# Patient Record
Sex: Female | Born: 1965
Health system: Southern US, Community
[De-identification: ages and names within clinical notes are randomized; demographics above are authoritative.]

## PROBLEM LIST (undated history)

## (undated) DIAGNOSIS — B009 Herpesviral infection, unspecified: Secondary | ICD-10-CM

## (undated) DIAGNOSIS — F4310 Post-traumatic stress disorder, unspecified: Secondary | ICD-10-CM

## (undated) DIAGNOSIS — K219 Gastro-esophageal reflux disease without esophagitis: Secondary | ICD-10-CM

## (undated) DIAGNOSIS — I10 Essential (primary) hypertension: Secondary | ICD-10-CM

## (undated) DIAGNOSIS — H269 Unspecified cataract: Secondary | ICD-10-CM

## (undated) DIAGNOSIS — H409 Unspecified glaucoma: Secondary | ICD-10-CM

## (undated) DIAGNOSIS — J45909 Unspecified asthma, uncomplicated: Secondary | ICD-10-CM

## (undated) DIAGNOSIS — F329 Major depressive disorder, single episode, unspecified: Secondary | ICD-10-CM

## (undated) DIAGNOSIS — E119 Type 2 diabetes mellitus without complications: Secondary | ICD-10-CM

## (undated) DIAGNOSIS — N809 Endometriosis, unspecified: Secondary | ICD-10-CM

## (undated) DIAGNOSIS — E7800 Pure hypercholesterolemia, unspecified: Secondary | ICD-10-CM

## (undated) DIAGNOSIS — F32A Depression, unspecified: Secondary | ICD-10-CM

## (undated) DIAGNOSIS — F411 Generalized anxiety disorder: Secondary | ICD-10-CM

## (undated) HISTORY — DX: Herpesviral infection, unspecified: B00.9

## (undated) HISTORY — DX: Generalized anxiety disorder: F41.1

## (undated) HISTORY — DX: Unspecified asthma, uncomplicated: J45.909

## (undated) HISTORY — DX: Post-traumatic stress disorder, unspecified: F43.10

## (undated) HISTORY — DX: Gastro-esophageal reflux disease without esophagitis: K21.9

## (undated) HISTORY — DX: Essential (primary) hypertension: I10

## (undated) HISTORY — DX: Pure hypercholesterolemia, unspecified: E78.00

## (undated) HISTORY — PX: ANTERIOR CRUCIATE LIGAMENT REPAIR: SHX115

## (undated) HISTORY — DX: Major depressive disorder, single episode, unspecified: F32.9

## (undated) HISTORY — DX: Endometriosis, unspecified: N80.9

## (undated) HISTORY — DX: Unspecified cataract: H26.9

## (undated) HISTORY — DX: Unspecified glaucoma: H40.9

## (undated) HISTORY — DX: Depression, unspecified: F32.A

---
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
## 1990-04-15 HISTORY — PX: NASAL SINUS SURGERY: SHX719

## 1998-06-14 DIAGNOSIS — B009 Herpesviral infection, unspecified: Secondary | ICD-10-CM

## 1998-06-14 HISTORY — DX: Herpesviral infection, unspecified: B00.9

## 1999-09-21 ENCOUNTER — Other Ambulatory Visit: Admission: RE | Admit: 1999-09-21 | Discharge: 1999-09-21 | Payer: Self-pay | Admitting: *Deleted

## 1999-11-06 ENCOUNTER — Ambulatory Visit (HOSPITAL_BASED_OUTPATIENT_CLINIC_OR_DEPARTMENT_OTHER): Admission: RE | Admit: 1999-11-06 | Discharge: 1999-11-06 | Payer: Self-pay | Admitting: Orthopedic Surgery

## 1999-12-26 ENCOUNTER — Other Ambulatory Visit: Admission: RE | Admit: 1999-12-26 | Discharge: 1999-12-26 | Payer: Self-pay | Admitting: *Deleted

## 2000-12-09 ENCOUNTER — Other Ambulatory Visit: Admission: RE | Admit: 2000-12-09 | Discharge: 2000-12-09 | Payer: Self-pay | Admitting: *Deleted

## 2002-01-06 ENCOUNTER — Other Ambulatory Visit: Admission: RE | Admit: 2002-01-06 | Discharge: 2002-01-06 | Payer: Self-pay | Admitting: *Deleted

## 2003-05-18 ENCOUNTER — Other Ambulatory Visit: Admission: RE | Admit: 2003-05-18 | Discharge: 2003-05-18 | Payer: Self-pay | Admitting: *Deleted

## 2004-05-28 ENCOUNTER — Other Ambulatory Visit: Admission: RE | Admit: 2004-05-28 | Discharge: 2004-05-28 | Payer: Self-pay | Admitting: *Deleted

## 2005-01-29 ENCOUNTER — Encounter: Admission: RE | Admit: 2005-01-29 | Discharge: 2005-01-29 | Payer: Self-pay | Admitting: Family Medicine

## 2005-01-29 ENCOUNTER — Encounter: Payer: Self-pay | Admitting: Family Medicine

## 2005-12-25 ENCOUNTER — Other Ambulatory Visit: Admission: RE | Admit: 2005-12-25 | Discharge: 2005-12-25 | Payer: Self-pay | Admitting: Obstetrics & Gynecology

## 2006-09-03 ENCOUNTER — Encounter: Admission: RE | Admit: 2006-09-03 | Discharge: 2006-09-03 | Payer: Self-pay | Admitting: Obstetrics & Gynecology

## 2006-11-28 ENCOUNTER — Encounter: Payer: Self-pay | Admitting: Family Medicine

## 2006-12-25 ENCOUNTER — Other Ambulatory Visit: Admission: RE | Admit: 2006-12-25 | Discharge: 2006-12-25 | Payer: Self-pay | Admitting: Obstetrics and Gynecology

## 2007-02-05 LAB — HM MAMMOGRAPHY: HM Mammogram: NORMAL

## 2007-12-24 ENCOUNTER — Encounter: Payer: Self-pay | Admitting: Family Medicine

## 2009-01-17 ENCOUNTER — Emergency Department (HOSPITAL_COMMUNITY): Admission: EM | Admit: 2009-01-17 | Discharge: 2009-01-17 | Payer: Self-pay | Admitting: Family Medicine

## 2009-01-19 ENCOUNTER — Emergency Department (HOSPITAL_COMMUNITY): Admission: EM | Admit: 2009-01-19 | Discharge: 2009-01-19 | Payer: Self-pay | Admitting: Emergency Medicine

## 2009-02-14 LAB — CONVERTED CEMR LAB: Pap Smear: NORMAL

## 2009-03-27 ENCOUNTER — Ambulatory Visit: Payer: Self-pay | Admitting: Family Medicine

## 2009-03-27 DIAGNOSIS — R5381 Other malaise: Secondary | ICD-10-CM | POA: Insufficient documentation

## 2009-03-27 DIAGNOSIS — F4321 Adjustment disorder with depressed mood: Secondary | ICD-10-CM | POA: Insufficient documentation

## 2009-03-27 DIAGNOSIS — R5383 Other fatigue: Secondary | ICD-10-CM

## 2009-03-27 DIAGNOSIS — K219 Gastro-esophageal reflux disease without esophagitis: Secondary | ICD-10-CM | POA: Insufficient documentation

## 2009-03-28 LAB — CONVERTED CEMR LAB
ALT: 31 units/L (ref 0–35)
AST: 21 units/L (ref 0–37)
Albumin: 4.1 g/dL (ref 3.5–5.2)
Alkaline Phosphatase: 61 units/L (ref 39–117)
BUN: 14 mg/dL (ref 6–23)
Basophils Absolute: 0 10*3/uL (ref 0.0–0.1)
Basophils Relative: 0.5 % (ref 0.0–3.0)
Bilirubin, Direct: 0 mg/dL (ref 0.0–0.3)
CO2: 28 meq/L (ref 19–32)
Calcium: 9.1 mg/dL (ref 8.4–10.5)
Chloride: 104 meq/L (ref 96–112)
Cholesterol: 204 mg/dL — ABNORMAL HIGH (ref 0–200)
Creatinine, Ser: 1 mg/dL (ref 0.4–1.2)
Direct LDL: 139.3 mg/dL
Eosinophils Absolute: 0.2 10*3/uL (ref 0.0–0.7)
Eosinophils Relative: 2.4 % (ref 0.0–5.0)
GFR calc non Af Amer: 64.62 mL/min (ref >60)
Glucose, Bld: 99 mg/dL (ref 70–99)
HCT: 41.1 % (ref 36.0–46.0)
HDL: 49 mg/dL (ref >39.00)
Hemoglobin: 13.9 g/dL (ref 12.0–15.0)
Lymphocytes Relative: 25.9 % (ref 12.0–46.0)
Lymphs Abs: 1.7 10*3/uL (ref 0.7–4.0)
MCHC: 33.9 g/dL (ref 30.0–36.0)
MCV: 92.6 fL (ref 78.0–100.0)
Monocytes Absolute: 0.6 10*3/uL (ref 0.1–1.0)
Monocytes Relative: 9.7 % (ref 3.0–12.0)
Neutro Abs: 4.2 10*3/uL (ref 1.4–7.7)
Neutrophils Relative %: 61.5 % (ref 43.0–77.0)
Platelets: 214 10*3/uL (ref 150.0–400.0)
Potassium: 4.1 meq/L (ref 3.5–5.1)
RBC: 4.44 M/uL (ref 3.87–5.11)
RDW: 12.2 % (ref 11.5–14.6)
Sodium: 139 meq/L (ref 135–145)
TSH: 1.36 microintl units/mL (ref 0.35–5.50)
Total Bilirubin: 0.9 mg/dL (ref 0.3–1.2)
Total CHOL/HDL Ratio: 4
Total Protein: 7.1 g/dL (ref 6.0–8.3)
Triglycerides: 80 mg/dL (ref 0.0–149.0)
VLDL: 16 mg/dL (ref 0.0–40.0)
WBC: 6.7 10*3/uL (ref 4.5–10.5)

## 2009-08-16 ENCOUNTER — Encounter: Admission: RE | Admit: 2009-08-16 | Discharge: 2009-08-16 | Payer: Self-pay | Admitting: Orthopedic Surgery

## 2009-09-13 HISTORY — PX: LAPAROSCOPIC TOTAL HYSTERECTOMY: SUR800

## 2009-09-26 ENCOUNTER — Ambulatory Visit (HOSPITAL_COMMUNITY): Admission: RE | Admit: 2009-09-26 | Discharge: 2009-09-26 | Payer: Self-pay | Admitting: Obstetrics & Gynecology

## 2009-10-11 ENCOUNTER — Ambulatory Visit (HOSPITAL_COMMUNITY): Admission: AD | Admit: 2009-10-11 | Discharge: 2009-10-11 | Payer: Self-pay | Admitting: Obstetrics & Gynecology

## 2009-12-04 ENCOUNTER — Encounter: Payer: Self-pay | Admitting: Cardiovascular Disease

## 2009-12-04 ENCOUNTER — Ambulatory Visit: Payer: Self-pay

## 2009-12-04 DIAGNOSIS — M7989 Other specified soft tissue disorders: Secondary | ICD-10-CM | POA: Insufficient documentation

## 2010-05-15 NOTE — Miscellaneous (Signed)
Summary: Orders Update  Clinical Lists Changes  Problems: Added new problem of SWELLING OF LIMB (ICD-729.81) Orders: Added new Test order of Venous Duplex Lower Extremity (Venous Duplex Lower) - Signed 

## 2010-05-15 NOTE — Letter (Signed)
Summary: [facility]  [facility]   Imported By: Lanelle Bal 04/24/2009 11:30:39  _____________________________________________________________________  External Attachment:    Type:   Image     Comment:   External Document

## 2010-06-06 ENCOUNTER — Other Ambulatory Visit: Payer: Self-pay | Admitting: Obstetrics & Gynecology

## 2010-06-06 DIAGNOSIS — Z1231 Encounter for screening mammogram for malignant neoplasm of breast: Secondary | ICD-10-CM

## 2010-06-15 ENCOUNTER — Ambulatory Visit
Admission: RE | Admit: 2010-06-15 | Discharge: 2010-06-15 | Disposition: A | Discharge status: Home / Self Care | Payer: 59 | Source: Ambulatory Visit | Attending: Obstetrics & Gynecology | Admitting: Obstetrics & Gynecology

## 2010-06-15 DIAGNOSIS — Z1231 Encounter for screening mammogram for malignant neoplasm of breast: Secondary | ICD-10-CM

## 2010-07-02 LAB — BASIC METABOLIC PANEL
BUN: 12 mg/dL (ref 6–23)
CO2: 26 mEq/L (ref 19–32)
Calcium: 9.2 mg/dL (ref 8.4–10.5)
Chloride: 103 mEq/L (ref 96–112)
Creatinine, Ser: 0.91 mg/dL (ref 0.4–1.2)
GFR calc Af Amer: >60 mL/min (ref >60)
GFR calc non Af Amer: >60 mL/min (ref >60)
Glucose, Bld: 95 mg/dL (ref 70–99)
Potassium: 3.9 mEq/L (ref 3.5–5.1)
Sodium: 138 mEq/L (ref 135–145)

## 2010-07-02 LAB — CBC
HCT: 39.5 % (ref 36.0–46.0)
HCT: 41.8 % (ref 36.0–46.0)
Hemoglobin: 13.6 g/dL (ref 12.0–15.0)
Hemoglobin: 14 g/dL (ref 12.0–15.0)
MCH: 30.9 pg (ref 26.0–34.0)
MCHC: 33.4 g/dL (ref 30.0–36.0)
MCHC: 34.3 g/dL (ref 30.0–36.0)
MCV: 90.1 fL (ref 78.0–100.0)
MCV: 90.4 fL (ref 78.0–100.0)
Platelets: 241 10*3/uL (ref 150–400)
Platelets: 329 10*3/uL (ref 150–400)
RBC: 4.39 MIL/uL (ref 3.87–5.11)
RBC: 4.63 MIL/uL (ref 3.87–5.11)
RDW: 12.4 % (ref 11.5–15.5)
RDW: 13.2 % (ref 11.5–15.5)
WBC: 8.3 10*3/uL (ref 4.0–10.5)
WBC: 9.2 10*3/uL (ref 4.0–10.5)

## 2010-07-02 LAB — URINE MICROSCOPIC-ADD ON

## 2010-07-02 LAB — SURGICAL PCR SCREEN
MRSA, PCR: NEGATIVE
Staphylococcus aureus: NEGATIVE

## 2010-07-02 LAB — URINALYSIS, ROUTINE W REFLEX MICROSCOPIC
Glucose, UA: NEGATIVE mg/dL
Hgb urine dipstick: NEGATIVE
Ketones, ur: NEGATIVE mg/dL
Nitrite: NEGATIVE
Protein, ur: NEGATIVE mg/dL
Specific Gravity, Urine: 1.026 (ref 1.005–1.030)
Urobilinogen, UA: 1 mg/dL (ref 0.0–1.0)
pH: 6 (ref 5.0–8.0)

## 2010-07-02 LAB — PREGNANCY, URINE: Preg Test, Ur: NEGATIVE

## 2010-08-31 NOTE — Op Note (Signed)
[place]. [facility]  Patient:    Brenda Obrien, Brenda Obrien                         MRN: [id number] Proc. Date: 11/06/99 Attending:  Alinda Deem, M.D.                           Operative Report  PREOPERATIVE DIAGNOSIS:  Cartilage tears to the left knee, possible loose bodies, lateral meniscal tear and deficient anterior cruciate ligament graft.  POSTOPERATIVE DIAGNOSIS:  Cartilage tears to the left knee, possible loose bodies, lateral meniscal tear and deficient anterior cruciate ligament graft. Chondromalacia of medial femoral condyle with grade 4 focal 1 x 1 cm trochlea, grade 4 focal 1 x 1 cm and the patella grade 3 focal 5 x 5 mm.  OPERATION PERFORMED:  Left knee arthroscopic partial lateral meniscectomy, debridement of partial anterior cruciate ligament tear, debridement of chondromalacia of the medial femoral condyle grade 4, trochlea and patella as well as arthroscopic removal of loose bodies.  SURGEON:  Alinda Deem, M.D.  ASSISTANT:  Siri Cole, PA student  ANESTHESIA:  General LMA.  ESTIMATED BLOOD LOSS:  Minimal.  FLUID REPLACEMENT:  1L of crystalloid.  DRAINS:  None.  TOURNIQUET TIME:  None.  INDICATIONS FOR PROCEDURE:   The patient is a 45 year old woman who underwent an over-the-top ACL reconstruction by another physician some time in the late 80s.  She has had increasing pain, popping and catching in her left knee over the last year to the point where it is now affecting her ability to do activities of daily living.  Plain radiographs show evidence of a degenerative tear of the medial meniscus.  She is tender over the medial joint line with positive McMurrays test.  She desires arthroscopic evaluation of the knee. The arthritic changes are probably stabilized, her knee which has an ACL graft by MRI scan.  It is somewhat attenuated.  She also has lateral meniscal tear by MRI scan.  DESCRIPTION OF PROCEDURE:  The patient was  identified by arm band and taken to the operating room at [facility] where the appropriate anesthetic monitors were attached and general LMA anesthesia was induced with the patient in a supine position.  A lateral post was applied to the table and left lower extremity prepped and draped in the usual sterile fashion from the ankle to the midthigh and we began the procedure by infiltrating the inferomedial and inferolateral peripatellar portal regions with 2 to 3 cc of 0.5% Marcaine with epinephrine solution and another 10 cc into the joint itself.  A #11 blade was used to make standard portals.  The arthroscope introduced through the inferolateral portal with a pump attached and an outflow through the inferomedial portal.  Grade 3 chondromalacia of the patella 5 x 5 mm was identified and debrided.  Grade 4 chondromalacia of the trochlea 1 x 1 cm was identified and debrided.  Moving to the medial compartment, a 1 x 1 cm area of grade 4 chondromalacia of the medial femoral condyle with flap tears was identified and debrided with a 3.5 mm gator sucker shaver.  The medial meniscus was intact.  Approximately 30 to 40% of the ACL over-the-top reconstruction fibers were torn and flipping in and out of the joint and these were also resected.  The rest of the graft was intact although somewhat loose and there were some  notch osteophytes as well.  Two loose bodies were identified and removed anteriorly, one osteochondral and one straight chondral.  On the lateral side a parrot beak tear of the lateral meniscus was identified and scalloped, removing the part that flipped in and out of the joint. The articular cartilage appeared to be in relatively good condition. The gutters were cleared.  The knee was then continuously washed out with normal saline solution.  The arthroscopic instruments were removed.  A dressing of Xeroform, 4 x 8 dressing sponges, Webril and an Ace wrap applied. The  patient was then awaken and taken to the recovery room without difficulty. DD:  11/06/99 TD:  11/07/99 Job: [id number]

## 2010-11-06 LAB — LIPID PANEL
Cholesterol: 214 mg/dL — AB (ref 0–200)
HDL: 47 mg/dL (ref 35–70)
LDL Cholesterol: 148 mg/dL
LDl/HDL Ratio: 4.6
Triglycerides: 94 mg/dL (ref 40–160)

## 2010-11-09 ENCOUNTER — Emergency Department (HOSPITAL_COMMUNITY)
Admission: EM | Admit: 2010-11-09 | Discharge: 2010-11-09 | Disposition: A | Discharge status: Home / Self Care | Payer: 59 | Attending: Emergency Medicine | Admitting: Emergency Medicine

## 2010-11-09 DIAGNOSIS — S40029A Contusion of unspecified upper arm, initial encounter: Secondary | ICD-10-CM | POA: Insufficient documentation

## 2010-11-09 DIAGNOSIS — R040 Epistaxis: Secondary | ICD-10-CM | POA: Insufficient documentation

## 2010-11-09 DIAGNOSIS — X58XXXA Exposure to other specified factors, initial encounter: Secondary | ICD-10-CM | POA: Insufficient documentation

## 2010-11-09 DIAGNOSIS — K219 Gastro-esophageal reflux disease without esophagitis: Secondary | ICD-10-CM | POA: Insufficient documentation

## 2010-11-09 DIAGNOSIS — F341 Dysthymic disorder: Secondary | ICD-10-CM | POA: Insufficient documentation

## 2010-11-13 ENCOUNTER — Encounter: Payer: Self-pay | Admitting: Family Medicine

## 2010-11-21 ENCOUNTER — Encounter: Payer: Self-pay | Admitting: Family Medicine

## 2010-11-22 ENCOUNTER — Encounter: Payer: Self-pay | Admitting: Family Medicine

## 2010-11-22 ENCOUNTER — Ambulatory Visit (INDEPENDENT_AMBULATORY_CARE_PROVIDER_SITE_OTHER): Payer: 59 | Admitting: Family Medicine

## 2010-11-22 VITALS — BP 126/76 | HR 88 | Temp 98.7°F | Wt 245.0 lb

## 2010-11-22 DIAGNOSIS — E785 Hyperlipidemia, unspecified: Secondary | ICD-10-CM

## 2010-11-22 DIAGNOSIS — R000 Tachycardia, unspecified: Secondary | ICD-10-CM

## 2010-11-22 NOTE — Patient Instructions (Addendum)
Return in 3 months for follow up with PCP, fasting prior for blood work to recheck cholesterol levels. Return sooner if cutting back on caffeine and healthy eating don't help rapid heart rate. 1 week prior to next appointment, keep log of blood pressures and heart rate. For leg swelling and blood pressure - watch salt intake, goal for you 2000mg  daily sodium. Increase potassium in diet - more fruits and vegetables. To help lower bad cholesterol - more soy, nuts, beans, legumes, lentils.  Low cholesterol diet provided. If bad cholesterol staying high, may start statin. Good to meet you, call us with questions.

## 2010-11-22 NOTE — Assessment & Plan Note (Addendum)
Reviewed last FLP in chart, discussed goal for her given fmhx likely <100, definitely <130. Pt prefers to avoid starting statin currently, prefers to do intensive diet/lifestly management. Provided with low chol diet, discussed healthy food choices. rec continued weight loss, congratulated on current weight loss. Advised f/u in 3 mo with PCP, return sooner for fasting bloodwork States had recent normal thyroid, kidneys, liver blood work at [facility].

## 2010-11-22 NOTE — Assessment & Plan Note (Addendum)
Anticipate significant component attributable to caffeine intake and recent increase. rec slowly decrease caffeine intake, push water during week. More fruits/vegetables for potassium. Today BP and HR stable.   RTC if red flags or not improving with this, o/w RTC 3 mo for f/u with PCP.  EKG - NSR 94, no ST /T changes, normal axis, intervals

## 2010-11-22 NOTE — Progress Notes (Signed)
Subjective:    Patient ID: Brenda Obrien, female    DOB: 10/27/1965, 45 y.o.   MRN: [id number]  HPI CC: heart rate and bp up?  April had knee surgery, 3 herniated disks.  Went through several rounds of prednisone and 6 cortisone shots.  After steroids, felt heart rate racing.  This has gone on since then.  Rapid regular heart rate.  Also endorses bp running elevated 150/100.  Heart rate 95-120.  Feels especially when laying down at night.  No skipped beats, just fast.  H/o heart murmur per pt.  Endorsing swelling in ankles and feet worse at end of day.  No chest pain or tightness, SOB, HA, dizziness, voiding ok.  Nose bleed 2 weeks ago.  Went to EMS and given afrin in nose, bleed stopped after 30 min (did go to [facility]).  Has had borderline blood pressure for some time now.  Trouble exercising 2/2 pain from back and knees.  Over last 2 wks has increased activity, watched diet, walking and recumbent bicycling, has lost 6 lbs.  Doesn't add salt to food. Not much water.  Not many vegetables.  Does well on fruits. Works 3rd shift.  2 Liter diet coke/day, started going up after surgery (correlating with when noted rapid heart rate). Quit smoking 2006.  Family history - significant CAD/MI in mother and father and their families.  PAD in mother.  + early family history - father with MI age 61, mother with MI late 33s.  Dr. Hyacinth Meeker [facility] checked cholesterol levels 04/2010 - returned high.  Given 6 mo for lifestyle changes, only mildly lowered.  Wants to stay away from meds.  Reviewed chart, chol levels from 10/2010 with TC 214, LDL 148.  Good HDL and trig. EKG done by OBGYN last year, normal.  Next appt with Dr. Dayton Martes - none.  Medications and allergies reviewed and updated in chart. Patient Active Problem List  Diagnoses  . GRIEF REACTION, ACUTE  . GERD  . SWELLING OF LIMB  . FATIGUE   Past Medical History  Diagnosis Date  . GERD (gastroesophageal reflux disease)    Past Surgical History    Procedure Date  . Anterior cruciate ligament repair 1987,2001,2003,2005   History  Substance Use Topics  . Smoking status: Former Smoker    Quit date: 04/15/2004  . Smokeless tobacco: Not on file  . Alcohol Use: Yes     Socially   Family History  Problem Relation Age of Onset  . Coronary artery disease Mother     PAD  . Heart attack Mother 35  . Lung cancer Father   . Heart attack Father 30  . Heart attack Brother 45  . Hepatitis Brother     Hep B  . Bipolar disorder Sister    No Known Allergies Current Outpatient Prescriptions on File Prior to Visit  Medication Sig Dispense Refill  . DULoxetine (CYMBALTA) 60 MG capsule Take 90 mg by mouth daily.       Marland Kitchen omeprazole (PRILOSEC) 40 MG capsule Take 40 mg by mouth daily.        . temazepam (RESTORIL) 30 MG capsule Take 30 mg by mouth at bedtime as needed.         Review of Systems Per HPI    Objective:   Physical Exam  Nursing note and vitals reviewed. Constitutional: She appears well-developed and well-nourished. No distress.  HENT:  Head: Normocephalic and atraumatic.  Mouth/Throat: Oropharynx is clear and moist. No oropharyngeal exudate.  Eyes: Conjunctivae and EOM are normal. Pupils are equal, round, and reactive to light. No scleral icterus.  Neck: Normal range of motion. Neck supple.  Cardiovascular: Normal rate, regular rhythm, normal heart sounds and intact distal pulses.   No murmur heard.      Upper normal heart rate, regular.  Pulmonary/Chest: Effort normal and breath sounds normal. No respiratory distress. She has no wheezes. She has no rales.  Abdominal: Soft. Bowel sounds are normal. She exhibits no distension. There is no tenderness. There is no rebound and no guarding.  Musculoskeletal: She exhibits no edema.       Stiff movements 2/2 pain  Lymphadenopathy:    She has no cervical adenopathy.  Skin: Skin is warm and dry. No rash noted.  Psychiatric: She has a normal mood and affect.           Assessment & Plan:

## 2011-02-18 ENCOUNTER — Other Ambulatory Visit (INDEPENDENT_AMBULATORY_CARE_PROVIDER_SITE_OTHER): Payer: 59

## 2011-02-18 DIAGNOSIS — E785 Hyperlipidemia, unspecified: Secondary | ICD-10-CM

## 2011-02-18 LAB — LIPID PANEL
Cholesterol: 225 mg/dL — ABNORMAL HIGH (ref 0–200)
HDL: 48.8 mg/dL (ref >39.00)
Total CHOL/HDL Ratio: 5
Triglycerides: 104 mg/dL (ref 0.0–149.0)
VLDL: 20.8 mg/dL (ref 0.0–40.0)

## 2011-02-18 LAB — LDL CHOLESTEROL, DIRECT: Direct LDL: 165.6 mg/dL

## 2011-02-25 ENCOUNTER — Ambulatory Visit (INDEPENDENT_AMBULATORY_CARE_PROVIDER_SITE_OTHER): Payer: 59 | Admitting: Family Medicine

## 2011-02-25 ENCOUNTER — Encounter: Payer: Self-pay | Admitting: Family Medicine

## 2011-02-25 VITALS — BP 124/89 | HR 101 | Temp 98.2°F | Ht 66.0 in | Wt 224.8 lb

## 2011-02-25 DIAGNOSIS — R000 Tachycardia, unspecified: Secondary | ICD-10-CM

## 2011-02-25 DIAGNOSIS — J029 Acute pharyngitis, unspecified: Secondary | ICD-10-CM

## 2011-02-25 DIAGNOSIS — E785 Hyperlipidemia, unspecified: Secondary | ICD-10-CM

## 2011-02-25 LAB — POCT RAPID STREP A (OFFICE): Rapid Strep A Screen: NEGATIVE

## 2011-02-25 MED ORDER — PENICILLIN V POTASSIUM 500 MG PO TABS: 500.0000 mg | ORAL_TABLET | Freq: Three times a day (TID) | ORAL | Status: AC

## 2011-02-25 MED ORDER — FLUCONAZOLE 150 MG PO TABS: 150.0000 mg | ORAL_TABLET | Freq: Once | ORAL | Status: AC

## 2011-02-25 MED ORDER — SIMVASTATIN 20 MG PO TABS: 20.0000 mg | ORAL_TABLET | Freq: Every day | ORAL | Status: DC

## 2011-02-25 NOTE — Patient Instructions (Addendum)
Please come back to have your labs rechecked in 6 months- cholesterol and liver panel. Call me with an update of your symptoms.   Have a wonderful Thanksgiving.

## 2011-02-25 NOTE — Progress Notes (Signed)
Subjective:    Patient ID: Brenda Obrien, female    DOB: March 29, 1966, 45 y.o.   MRN: [id number]  HPI  45 yo here for follow up.  HLD- chol levels from 10/2010 with TC 214, LDL 148.  Wanted to work on diet, in fact, lost significant amount of weight.  Wt Readings from Last 3 Encounters:  02/25/11 224 lb 12 oz (101.946 kg)  11/22/10 245 lb (111.131 kg)  03/27/09 209 lb 2.1 oz (94.861 kg)    Unfortunately, despite these changes,  LDL has increased further. Lab Results  Component Value Date   CHOL 225* 02/18/2011   HDL 48.80 02/18/2011   LDLCALC 148 11/06/2010   LDLDIRECT 165.6 02/18/2011   TRIG 104.0 02/18/2011   CHOLHDL 5 02/18/2011   Quit smoking 2006.  Very strong family cardiac history-  + PAD in mother.  + early family history - father with MI age 25, mother with MI late 58s.  Sore throat- woke up yesterday with very severe sore throat.  Has body aches, swollen nodes. No cough, runny nose, wheezing, SOB or other URI symptoms. Afebrile.  Medications and allergies reviewed and updated in chart. Patient Active Problem List  Diagnoses  . GRIEF REACTION, ACUTE  . GERD  . SWELLING OF LIMB  . FATIGUE  . Tachycardia  . Dyslipidemia   Past Medical History  Diagnosis Date  . GERD (gastroesophageal reflux disease)    Past Surgical History  Procedure Date  . Anterior cruciate ligament repair 1987,2001,2003,2005   History  Substance Use Topics  . Smoking status: Former Smoker    Quit date: 04/15/2004  . Smokeless tobacco: Not on file  . Alcohol Use: Yes     Socially   Family History  Problem Relation Age of Onset  . Coronary artery disease Mother     PAD  . Heart attack Mother 22  . Lung cancer Father   . Heart attack Father 30  . Heart attack Brother 45  . Hepatitis Brother     Hep B  . Bipolar disorder Sister    No Known Allergies Current Outpatient Prescriptions on File Prior to Visit  Medication Sig Dispense Refill  . cyclobenzaprine (FLEXERIL) 10 MG  tablet Take 10 mg by mouth 3 (three) times daily as needed.        . DULoxetine (CYMBALTA) 60 MG capsule Take 90 mg by mouth daily.       . ergocalciferol (VITAMIN D2) 50000 UNITS capsule Take 50,000 Units by mouth once a week.        Marland Kitchen omeprazole (PRILOSEC) 40 MG capsule Take 40 mg by mouth daily.        . temazepam (RESTORIL) 30 MG capsule Take 30 mg by mouth at bedtime as needed.        . traMADol (ULTRAM) 50 MG tablet Take 50 mg by mouth 2 (two) times daily.         Review of Systems Per HPI    Objective:   Physical Exam  BP 124/89  Pulse 101  Temp(Src) 98.2 F (36.8 C) (Oral)  Ht 5\' 6"  (1.676 m)  Wt 224 lb 12 oz (101.946 kg)  BMI 36.28 kg/m2  Constitutional: She appears well-developed and well-nourished. No distress.  HENT:  Head: Normocephalic and atraumatic.  Mouth/Throat: pharyngeal erythema and tonsillar enlargement with exudate.  Eyes: Conjunctivae and EOM are normal. Pupils are equal, round, and reactive to light. No scleral icterus.  Neck: Normal range of motion. Neck supple.  Cardiovascular:  Normal rate, regular rhythm, normal heart sounds and intact distal pulses.   No murmur heard.      Mildly tachycardic, regular.  Pulmonary/Chest: Effort normal and breath sounds normal. No respiratory distress. She has no wheezes. She has no rales.  Abdominal: Soft. Bowel sounds are normal. She exhibits no distension. There is no tenderness. There is no rebound and no guarding.  Musculoskeletal: She exhibits no edema.     Lymphadenopathy:    She has shotty cervical lymphadenopathy Skin: Skin is warm and dry. No rash noted.  Psychiatric: She has a normal mood and affect.     Assessment & Plan:   1. Dyslipidemia  Deteriorated. At this point, she is agreeable to starting medication. Will start Simvastatin 20 mg daily. Follow up labs in 6 months. Lipid panel, Hepatic function panel  2. Sore throat  New.  Rapid strep neg but history and physical exam consistent with strep  pharyngitis. Will treat with PCN 500 mg three times daily x 10 days. The patient indicates understanding of these issues and agrees with the plan.  POCT rapid strep A

## 2011-06-19 ENCOUNTER — Other Ambulatory Visit: Payer: Self-pay | Admitting: Family Medicine

## 2011-07-01 DIAGNOSIS — S83519A Sprain of anterior cruciate ligament of unspecified knee, initial encounter: Secondary | ICD-10-CM | POA: Insufficient documentation

## 2011-08-26 ENCOUNTER — Other Ambulatory Visit: Payer: 59

## 2011-08-30 ENCOUNTER — Other Ambulatory Visit (INDEPENDENT_AMBULATORY_CARE_PROVIDER_SITE_OTHER): Payer: 59

## 2011-08-30 DIAGNOSIS — E785 Hyperlipidemia, unspecified: Secondary | ICD-10-CM

## 2011-08-30 LAB — LIPID PANEL
Cholesterol: 175 mg/dL (ref 0–200)
LDL Cholesterol: 105 mg/dL — ABNORMAL HIGH (ref 0–99)
Total CHOL/HDL Ratio: 3
VLDL: 17 mg/dL (ref 0.0–40.0)

## 2011-08-30 LAB — HEPATIC FUNCTION PANEL
Alkaline Phosphatase: 76 U/L (ref 39–117)
Bilirubin, Direct: 0.1 mg/dL (ref 0.0–0.3)
Total Protein: 7.2 g/dL (ref 6.0–8.3)

## 2011-09-14 HISTORY — PX: OTHER SURGICAL HISTORY: SHX169

## 2011-10-20 ENCOUNTER — Other Ambulatory Visit: Payer: Self-pay | Admitting: Family Medicine

## 2012-01-20 ENCOUNTER — Encounter: Payer: Self-pay | Admitting: Family Medicine

## 2012-01-20 ENCOUNTER — Telehealth: Payer: Self-pay | Admitting: Family Medicine

## 2012-01-20 ENCOUNTER — Ambulatory Visit (INDEPENDENT_AMBULATORY_CARE_PROVIDER_SITE_OTHER): Payer: 59 | Admitting: Family Medicine

## 2012-01-20 VITALS — BP 132/92 | HR 84 | Temp 98.4°F | Wt 247.0 lb

## 2012-01-20 DIAGNOSIS — I10 Essential (primary) hypertension: Secondary | ICD-10-CM

## 2012-01-20 MED ORDER — DULOXETINE HCL 30 MG PO CPEP: 120.0000 mg | ORAL_CAPSULE | Freq: Every day | ORAL | Status: DC

## 2012-01-20 NOTE — Patient Instructions (Addendum)
Take 4 cymbalta a day and let us know if your pressure stays elevated at work.  Take care.

## 2012-01-20 NOTE — Telephone Encounter (Signed)
Caller: [name]/Patient; Patient Name: Brenda Obrien; PCP: Ruthe Mannan [facility]); Best Callback Phone Number: [phone]; Call regarding elvated BP and Nose bleeds, onset 10-4.  BP 190/100 on 10-4 with Nose bleed.  Patient denies Nose bleed, no way of checking BP currently.  BP 180/100 on 10-6.  Patient has slight Headache over right Eye. Pt has stressful job working for [employer]. All emergent symptoms ruled out per Hypertension protocol, see in 72 hours due to recurring nosebleeds and multiple elevated BP reading, no previous work-ups.  Appointment scheduled at 1400 on 10-7 with Dr Para March, no same day appointments with Dr Dayton Martes.  Patient verbalized understanding.

## 2012-01-20 NOTE — Telephone Encounter (Signed)
Left v/m for pt to call back; if pt not having chest pain, difficulty breathing,limbs working appropriately and no severe headache is OK to wait for 2 pm appt today per Dr Para March.

## 2012-01-20 NOTE — Telephone Encounter (Signed)
Pt called back no CP,SOB neuro problems and h/a is not severe. Pt will call back if symptoms change or worsen prior to appt today at 2 pm.

## 2012-01-20 NOTE — Telephone Encounter (Signed)
Okay to see patient today at [facility] as long as she doesn't have CP/SOB/focal neuro sx.  Thanks.

## 2012-01-20 NOTE — Progress Notes (Signed)
Works [employer], for 17 years.  Workload is increasing.  She changed to day shift but the call volume is increased during the day.  She is a Merchandiser, retail.  Her anxiety level is high, she admits this.  Working 12 hours shifts.  She enjoys the work.  Pressure has been up on recent checks.  Friday BP was up on check, had a nosebleed that day.  When she gets anxious, she'll get chest tightness but not sob.  No chest tightness o/w.  No CP with exercise at PT.  No BLE edema recently.  Weight increased in last 3 years with dec in exercise due to her knee pain.   Her BP is better after a slow shift at work.   Had been on cymbalta for family stress/mood changes and for pain in knee.  No SI/HI.  Meds, vitals, and allergies reviewed.   ROS: See HPI.  Otherwise, noncontributory.  nad ncat Mmm Nasal exam wnl Neck supple rrr ctab Ext well perfused.  L knee in brace

## 2012-01-21 DIAGNOSIS — I10 Essential (primary) hypertension: Secondary | ICD-10-CM | POA: Insufficient documentation

## 2012-01-21 NOTE — Assessment & Plan Note (Signed)
Likely exacerbated by work stress.  BP okay today.  D/w pt about options.  If BP were high today, I would have started BP meds.  With BP okay, I would inc the cymbalta to 120 mg (she'll continue the current rx for 90mg  and take an extra 30mg  from samples I gave her).  She agrees.  She'll check BP and if not improved will notify PCP or me.  She agrees.

## 2012-01-22 ENCOUNTER — Other Ambulatory Visit: Payer: Self-pay | Admitting: Family Medicine

## 2012-01-22 DIAGNOSIS — I10 Essential (primary) hypertension: Secondary | ICD-10-CM

## 2012-01-22 DIAGNOSIS — E785 Hyperlipidemia, unspecified: Secondary | ICD-10-CM

## 2012-01-27 ENCOUNTER — Other Ambulatory Visit: Payer: 59

## 2012-01-29 ENCOUNTER — Other Ambulatory Visit (INDEPENDENT_AMBULATORY_CARE_PROVIDER_SITE_OTHER): Payer: 59

## 2012-01-29 DIAGNOSIS — E785 Hyperlipidemia, unspecified: Secondary | ICD-10-CM

## 2012-01-29 DIAGNOSIS — I10 Essential (primary) hypertension: Secondary | ICD-10-CM

## 2012-01-29 LAB — COMPREHENSIVE METABOLIC PANEL
ALT: 47 U/L — ABNORMAL HIGH (ref 0–35)
AST: 30 U/L (ref 0–37)
Albumin: 3.8 g/dL (ref 3.5–5.2)
BUN: 12 mg/dL (ref 6–23)
CO2: 26 mEq/L (ref 19–32)
Calcium: 9.1 mg/dL (ref 8.4–10.5)
Chloride: 108 mEq/L (ref 96–112)
GFR: 72.94 mL/min (ref >60.00)
Potassium: 4.4 mEq/L (ref 3.5–5.1)

## 2012-01-29 LAB — LIPID PANEL
Cholesterol: 187 mg/dL (ref 0–200)
HDL: 53.4 mg/dL (ref >39.00)
Triglycerides: 151 mg/dL — ABNORMAL HIGH (ref 0.0–149.0)

## 2012-02-03 ENCOUNTER — Encounter: Payer: Self-pay | Admitting: Family Medicine

## 2012-02-03 ENCOUNTER — Ambulatory Visit (INDEPENDENT_AMBULATORY_CARE_PROVIDER_SITE_OTHER): Payer: 59 | Admitting: Family Medicine

## 2012-02-03 VITALS — BP 130/92 | HR 88 | Temp 98.0°F | Ht 66.0 in | Wt 244.0 lb

## 2012-02-03 DIAGNOSIS — R739 Hyperglycemia, unspecified: Secondary | ICD-10-CM

## 2012-02-03 DIAGNOSIS — Z0000 Encounter for general adult medical examination without abnormal findings: Secondary | ICD-10-CM

## 2012-02-03 DIAGNOSIS — E785 Hyperlipidemia, unspecified: Secondary | ICD-10-CM

## 2012-02-03 DIAGNOSIS — I10 Essential (primary) hypertension: Secondary | ICD-10-CM

## 2012-02-03 DIAGNOSIS — R7309 Other abnormal glucose: Secondary | ICD-10-CM

## 2012-02-03 MED ORDER — METFORMIN HCL ER (MOD) 500 MG PO TB24: 500.0000 mg | ORAL_TABLET | Freq: Every day | ORAL | Status: DC

## 2012-02-03 NOTE — Progress Notes (Signed)
Subjective:    Patient ID: Brenda Obrien, female    DOB: [birth year], 46 y.o.   MRN: [id number]  HPI  46 yo very pleasant female here for CPX. Has GYN- s/p hysterectomy.  GYN is Corrie Mckusick and she will see her 04/2012.  Hyperglycemia- fasting CBG has increased to 110 this week, as well as increase in TG. Mom was a diabetic.  Weight has gone up.  She does have end stage OA in her left knee but she knows she can still swim and do other activities.  Job is very stressful and admits to stress eating.  Wt Readings from Last 3 Encounters:  02/03/12 244 lb (110.678 kg)  01/20/12 247 lb (112.038 kg)  02/25/11 224 lb 12 oz (101.946 kg)   BP- has had elevated BP readings at work.  She does think it is stress related but she is worried about having a stroke.  When it goes up, typically 150s/90s but once was as high as 190 systolic.  She was symptomatic with that reading- face felt flushed.  No CP or SOB.  Recently increased dose of Cymbalta.  HLD- much improved on Zocor! Lab Results  Component Value Date   CHOL 187 01/29/2012   HDL 53.40 01/29/2012   LDLCALC 103* 01/29/2012   LDLDIRECT 165.6 02/18/2011   TRIG 151.0* 01/29/2012   CHOLHDL 4 01/29/2012      Quit smoking 2006.  Very strong family cardiac history-  + PAD in mother.  + early family history - father with MI age 40, mother with MI late 35s.    Medications and allergies reviewed and updated in chart. Patient Active Problem List  Diagnosis  . GRIEF REACTION, ACUTE  . GERD  . SWELLING OF LIMB  . FATIGUE  . Tachycardia  . Dyslipidemia  . Sore throat  . Hypertension  . Routine general medical examination at a health care facility   Past Medical History  Diagnosis Date  . GERD (gastroesophageal reflux disease)    Past Surgical History  Procedure Date  . Anterior cruciate ligament repair 1987,2001,2003,2005   History  Substance Use Topics  . Smoking status: Former Smoker    Quit date: 04/15/2004  . Smokeless  tobacco: Not on file  . Alcohol Use: Yes     Socially   Family History  Problem Relation Age of Onset  . Coronary artery disease Mother     PAD  . Heart attack Mother 57  . Lung cancer Father   . Heart attack Father 30  . Heart attack Brother 45  . Hepatitis Brother     Hep B  . Bipolar disorder Sister    No Known Allergies Current Outpatient Prescriptions on File Prior to Visit  Medication Sig Dispense Refill  . cyclobenzaprine (FLEXERIL) 10 MG tablet Take 10 mg by mouth 3 (three) times daily as needed.        . DULoxetine (CYMBALTA) 30 MG capsule Take 4 capsules (120 mg total) by mouth daily.      . ergocalciferol (VITAMIN D2) 50000 UNITS capsule Take 50,000 Units by mouth once a week.        Marland Kitchen omeprazole (PRILOSEC) 40 MG capsule Take 40 mg by mouth daily.        Marland Kitchen oxyCODONE-acetaminophen (PERCOCET/ROXICET) 5-325 MG per tablet Take 1 tablet by mouth every 4 (four) hours as needed.      . simvastatin (ZOCOR) 20 MG tablet TAKE 1 TABLET (20 MG TOTAL) BY MOUTH AT BEDTIME.  30  tablet  4  . temazepam (RESTORIL) 30 MG capsule Take 30 mg by mouth at bedtime as needed.        . traMADol (ULTRAM) 50 MG tablet Take 50 mg by mouth 2 (two) times daily as needed.        Review of Systems Per HPI    Objective:   Physical Exam  BP 130/92  Pulse 88  Temp 98 F (36.7 C)  Ht 5\' 6"  (1.676 m)  Wt 244 lb (110.678 kg)  BMI 39.38 kg/m2   General:  Well-developed,well-nourished,in no acute distress; alert,appropriate and cooperative throughout examination Head:  normocephalic and atraumatic.   Eyes:  vision grossly intact, pupils equal, pupils round, and pupils reactive to light.   Ears:  R ear normal and L ear normal.   Nose:  no external deformity.   Mouth:  good dentition.   Neck:  No deformities, masses, or tenderness noted. Lungs:  Normal respiratory effort, chest expands symmetrically. Lungs are clear to auscultation, no crackles or wheezes. Heart:  Normal rate and regular rhythm. S1  and S2 normal without gallop, murmur, click, rub or other extra sounds. Abdomen:  Bowel sounds positive,abdomen soft and non-tender without masses, organomegaly or hernias noted. Msk:  No deformity or scoliosis noted of thoracic or lumbar spine.   Extremities:  No clubbing, cyanosis, edema, or deformity noted with normal full range of motion of all joints.   Neurologic:  alert & oriented X3 and gait normal.   Skin:  Intact without suspicious lesions or rashes Cervical Nodes:  No lymphadenopathy noted Psych:  Cognition and judgment appear intact. Alert and cooperative with normal attention span and concentration. No apparent delusions, illusions, hallucinations  Assessment & Plan:   1. Routine general medical examination at a health care facility  Reviewed preventive care protocols, scheduled due services, and updated immunizations.  Discussed nutrition, exercise, diet, and healthy lifestyle.   2. Hypertension  Normotensive today.  Agree with increased dose of cymbalta as this is likely stress related.  She will buy a home BP cuff, call me with readings.  Since she is normotensive I am hesitant to start antihypertensives as I do not want to cause hypotension.  The patient indicates understanding of these issues and agrees with the plan.   3. Dyslipidemia  Improved with Zocor.  4. Hyperglycemia  New- discussed eat right diet.  Start Metformin 500 mg daily.  Discussed nutritionist referral- she would like to defer at this time.  Follow up in 3 months- check a1c at that time.  The patient indicates understanding of these issues and agrees with the plan.

## 2012-02-03 NOTE — Patient Instructions (Addendum)
Please keep checking your blood pressure at work and call me with readings.  If you have the money, OMRON home blood cuffs- digital are the best.  We are starting Metformin 500 mg daily.  Please come back in 3 months for follow up lab work and appointment.

## 2012-03-13 ENCOUNTER — Encounter: Payer: Self-pay | Admitting: Family Medicine

## 2012-03-13 ENCOUNTER — Ambulatory Visit (INDEPENDENT_AMBULATORY_CARE_PROVIDER_SITE_OTHER): Payer: 59 | Admitting: Family Medicine

## 2012-03-13 VITALS — BP 156/90 | HR 96 | Temp 98.8°F | Wt 240.0 lb

## 2012-03-13 DIAGNOSIS — F411 Generalized anxiety disorder: Secondary | ICD-10-CM

## 2012-03-13 DIAGNOSIS — F419 Anxiety disorder, unspecified: Secondary | ICD-10-CM | POA: Insufficient documentation

## 2012-03-13 DIAGNOSIS — I10 Essential (primary) hypertension: Secondary | ICD-10-CM

## 2012-03-13 MED ORDER — ESCITALOPRAM OXALATE 10 MG PO TABS: 10.0000 mg | ORAL_TABLET | Freq: Every day | ORAL | Status: DC

## 2012-03-13 MED ORDER — LORAZEPAM 0.5 MG PO TABS: 0.2500 mg | ORAL_TABLET | Freq: Three times a day (TID) | ORAL | Status: DC

## 2012-03-13 NOTE — Patient Instructions (Addendum)
It's great to see you. Hang in there. Let's wean off Cymbalta- 3 tablets by mouth daily x 1 week, 2 tablets by mouth daily x 1 week, 1 tab by mouth every other day for 1 week and stop.  Ativan as needed for anxiety.  Next week, start taking Lexapro 10 mg daily.  Call me in next 2 weeks.

## 2012-03-13 NOTE — Progress Notes (Signed)
Subjective:    Patient ID: Brenda Obrien, female    DOB: 1965-08-24, 46 y.o.   MRN: [id number]  HPI  46 yo very pleasant female here for:  BP- has had elevated BP readings at work.  Brings in log book- BP at home in 130s/80s-90s.  At work, running in 150s/90s .  She is sometimes symptomatic with these elevated readings- HA, facial flushing.   No CP or SOB.  Recently increased dose of Cymbalta which she feels is not helping. Has been on cymbalta since her sibling died years ago.  She does she a therapist she is very happy with.  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  Has too many responsibilities at work.  No SI or HI.    Patient Active Problem List  Diagnosis  . GRIEF REACTION, ACUTE  . GERD  . SWELLING OF LIMB  . FATIGUE  . Tachycardia  . Dyslipidemia  . Sore throat  . Hypertension  . Routine general medical examination at a health care facility  . Hyperglycemia   Past Medical History  Diagnosis Date  . GERD (gastroesophageal reflux disease)    Past Surgical History  Procedure Date  . Anterior cruciate ligament repair 1987,2001,2003,2005   History  Substance Use Topics  . Smoking status: Former Smoker    Quit date: 04/15/2004  . Smokeless tobacco: Not on file  . Alcohol Use: Yes     Comment: Socially   Family History  Problem Relation Age of Onset  . Coronary artery disease Mother     PAD  . Heart attack Mother 75  . Lung cancer Father   . Heart attack Father 30  . Heart attack Brother 45  . Hepatitis Brother     Hep B  . Bipolar disorder Sister    No Known Allergies Current Outpatient Prescriptions on File Prior to Visit  Medication Sig Dispense Refill  . cyclobenzaprine (FLEXERIL) 10 MG tablet Take 10 mg by mouth 3 (three) times daily as needed.        . DULoxetine (CYMBALTA) 30 MG capsule Take 4 capsules (120 mg total) by mouth daily.      . ergocalciferol (VITAMIN D2) 50000 UNITS capsule Take 50,000 Units by mouth once a  week.        . metFORMIN (GLUMETZA) 500 MG (MOD) 24 hr tablet Take 1 tablet (500 mg total) by mouth daily with breakfast.  30 tablet  2  . omeprazole (PRILOSEC) 40 MG capsule Take 40 mg by mouth daily.        Marland Kitchen oxyCODONE-acetaminophen (PERCOCET/ROXICET) 5-325 MG per tablet Take 1 tablet by mouth every 4 (four) hours as needed.      . simvastatin (ZOCOR) 20 MG tablet TAKE 1 TABLET (20 MG TOTAL) BY MOUTH AT BEDTIME.  30 tablet  4  . temazepam (RESTORIL) 30 MG capsule Take 30 mg by mouth at bedtime as needed.        . traMADol (ULTRAM) 50 MG tablet Take 50 mg by mouth 2 (two) times daily as needed.        Review of Systems Per HPI    Objective:   Physical Exam  BP 156/90  Pulse 96  Temp 98.8 F (37.1 C) (Oral)  Wt 240 lb (108.863 kg)  SpO2 98%   General:  Well-developed,well-nourished,in no acute distress; alert,appropriate and cooperative throughout examination Psych:  Cognition and judgment appear intact. Alert and cooperative with normal attention span and concentration. No apparent  delusions, illusions, hallucinations Tearful  Assessment & Plan:   1. Hypertension  Deteriorated like due to anxiety.   Has multiple normotensive readings so I am very hesitant to place her on antihypertensives.  2. Anxiety  Deteriorated.  See above.  Wean off cymbalta- very high dose currently.  Add prn ativan- discussed sedation and addiction precautions.  Start Lexapro 10 mg daily after dose of cymbalta has been weaned down to avoid serotonin syndrome. The patient indicates understanding of these issues and agrees with the plan.

## 2012-03-30 ENCOUNTER — Other Ambulatory Visit: Payer: Self-pay | Admitting: Family Medicine

## 2012-04-01 ENCOUNTER — Telehealth: Payer: Self-pay

## 2012-04-01 NOTE — Telephone Encounter (Signed)
Pt seen 03/13/12 off Cymbalta 1 week and pt taking Lexapro 1 1/2 weeks. Pt states she feels some difference but now she has period of uncertainty; pt states she feels weird; pt feels jittery inside.  3 hours After takes Lexapro she feels calm for 4-5 hours and then calmness goes awayPlease advise.[facility].Pt said only taking Ativan while she is at work and that seems to help. Today pt did not take Ativan at work and BP was 163/101; pt had h/a but no SOB or chest pain. Pt left work, took Ativan and now that she is at  Home she feels better. Pt has not taken BP again. Advised pt if condition changes or worsens before Dr Elmer Sow asst calls her back to go to [facility]

## 2012-04-01 NOTE — Telephone Encounter (Signed)
Let's go ahead and increase Lexapro to 20 mg daily to see if this will help= take two of her 10 mg tablets.  Please update dose in Epic.  Call us in a few days with an update.  I do want her to recheck her BP again tomorrow and call us.

## 2012-04-01 NOTE — Telephone Encounter (Signed)
Advised patient as instructed. 

## 2012-04-02 ENCOUNTER — Telehealth: Payer: Self-pay | Admitting: Family Medicine

## 2012-04-02 NOTE — Telephone Encounter (Signed)
Caller: Shadell/Patient; Phone: [phone]; Reason for Call: Calling to give her b/p reading for today: 174/89 @ 2p with a repeat at 230p 149/86. She will continue with her current medications as directed on 12/18 until she hears from someone in the office.

## 2012-04-03 ENCOUNTER — Telehealth: Payer: Self-pay | Admitting: Family Medicine

## 2012-04-03 NOTE — Telephone Encounter (Signed)
Please call to check on pt. 

## 2012-04-03 NOTE — Telephone Encounter (Signed)
Patient Information:  Caller Name: Kristie  Phone: [phone]  Patient: Brenda Obrien, Brenda Obrien  Gender: Female  DOB: 05-08-1965  Age: 46 Years  PCP: Ruthe Mannan [facility])  Pregnant: No  Office Follow Up:  Does the office need to follow up with this patient?: No  Instructions For The Office: N/A  RN Note:  Has intermittent readings over past several months, so has not been placed on blood pressure medicatiions, as Dr. Dayton Martes tells her she's afraid of bottoming her out.  Patient states she feels well.  Denies anxiety or headache.  Per protocol, emergent symptoms denied; advised follow up within 2 weeks.  Will continue to monitor over weekend and call Monday 04/06/12 as discussed with office staff.  Info to office.  krs/can  Symptoms  Reason For Call & Symptoms: BP 158/98 1430 04/03/12.  Reviewed Health History In EMR: Yes  Reviewed Medications In EMR: Yes  Reviewed Allergies In EMR: Yes  Reviewed Surgeries / Procedures: Yes  Date of Onset of Symptoms: Unknown OB / GYN:  LMP: Unknown  Guideline(s) Used:  High Blood Pressure  Disposition Per Guideline:   See Within 2 Weeks in Office  Reason For Disposition Reached:   BP > 140/90 and is not taking BP medications  Advice Given:  N/A  Patient Refused Recommendation:  Patient Will Follow Up With Office Later  Patient will follow up with office as discussed with staff krs/can

## 2012-04-03 NOTE — Telephone Encounter (Signed)
Noted! Thank you

## 2012-04-03 NOTE — Telephone Encounter (Signed)
Spoke with patient- states she feels better today- will keep a check on her BP over the week end and will call back on Monday.  I will ask one of the other doctor's to review if high.

## 2012-04-06 ENCOUNTER — Telehealth: Payer: Self-pay | Admitting: Family Medicine

## 2012-04-06 ENCOUNTER — Encounter (HOSPITAL_COMMUNITY): Payer: Self-pay | Admitting: *Deleted

## 2012-04-06 ENCOUNTER — Emergency Department (HOSPITAL_COMMUNITY)
Admission: EM | Admit: 2012-04-06 | Discharge: 2012-04-06 | Disposition: A | Discharge status: Home / Self Care | Payer: 59 | Attending: Emergency Medicine | Admitting: Emergency Medicine

## 2012-04-06 ENCOUNTER — Emergency Department (HOSPITAL_COMMUNITY): Payer: 59

## 2012-04-06 DIAGNOSIS — Z87891 Personal history of nicotine dependence: Secondary | ICD-10-CM | POA: Insufficient documentation

## 2012-04-06 DIAGNOSIS — R079 Chest pain, unspecified: Secondary | ICD-10-CM

## 2012-04-06 DIAGNOSIS — K529 Noninfective gastroenteritis and colitis, unspecified: Secondary | ICD-10-CM

## 2012-04-06 DIAGNOSIS — K5289 Other specified noninfective gastroenteritis and colitis: Secondary | ICD-10-CM | POA: Insufficient documentation

## 2012-04-06 DIAGNOSIS — Z79899 Other long term (current) drug therapy: Secondary | ICD-10-CM | POA: Insufficient documentation

## 2012-04-06 DIAGNOSIS — Z8719 Personal history of other diseases of the digestive system: Secondary | ICD-10-CM | POA: Insufficient documentation

## 2012-04-06 DIAGNOSIS — R0789 Other chest pain: Secondary | ICD-10-CM | POA: Insufficient documentation

## 2012-04-06 LAB — CBC
HCT: 43.4 % (ref 36.0–46.0)
Hemoglobin: 13.8 g/dL (ref 12.0–15.0)
MCV: 90.6 fL (ref 78.0–100.0)
RDW: 13.6 % (ref 11.5–15.5)
WBC: 14.5 10*3/uL — ABNORMAL HIGH (ref 4.0–10.5)

## 2012-04-06 LAB — TROPONIN I: Troponin I: <0.3 ng/mL (ref <0.30)

## 2012-04-06 LAB — BASIC METABOLIC PANEL
BUN: 16 mg/dL (ref 6–23)
Chloride: 98 mEq/L (ref 96–112)
Creatinine, Ser: 0.86 mg/dL (ref 0.50–1.10)
GFR calc Af Amer: >90 mL/min (ref >90)
Glucose, Bld: 120 mg/dL — ABNORMAL HIGH (ref 70–99)

## 2012-04-06 MED ORDER — HYDROMORPHONE HCL PF 1 MG/ML IJ SOLN
1.0000 mg | Freq: Once | INTRAMUSCULAR | Status: AC
  Administered 2012-04-06: 1 mg via INTRAVENOUS
  Filled 2012-04-06: qty 1

## 2012-04-06 MED ORDER — LOPERAMIDE HCL 2 MG PO TABS: 2.0000 mg | ORAL_TABLET | Freq: Four times a day (QID) | ORAL | Status: DC | PRN

## 2012-04-06 MED ORDER — SODIUM CHLORIDE 0.9 % IV SOLN
INTRAVENOUS | Status: DC
  Administered 2012-04-06: 16:00:00 via INTRAVENOUS

## 2012-04-06 MED ORDER — SODIUM CHLORIDE 0.9 % IV BOLUS (SEPSIS)
500.0000 mL | Freq: Once | INTRAVENOUS | Status: AC
  Administered 2012-04-06: 500 mL via INTRAVENOUS

## 2012-04-06 MED ORDER — ASPIRIN 81 MG PO CHEW
324.0000 mg | CHEWABLE_TABLET | Freq: Once | ORAL | Status: AC
  Administered 2012-04-06: 324 mg via ORAL
  Filled 2012-04-06: qty 4

## 2012-04-06 MED ORDER — ONDANSETRON HCL 4 MG/2ML IJ SOLN
4.0000 mg | Freq: Once | INTRAMUSCULAR | Status: AC
  Administered 2012-04-06: 4 mg via INTRAVENOUS
  Filled 2012-04-06: qty 2

## 2012-04-06 MED ORDER — ONDANSETRON 4 MG PO TBDP: 4.0000 mg | ORAL_TABLET | Freq: Three times a day (TID) | ORAL | Status: DC | PRN

## 2012-04-06 NOTE — ED Provider Notes (Addendum)
History     CSN: [id number]  Arrival date & time 04/06/12  1037   First MD Initiated Contact with Patient 04/06/12 1140      Chief Complaint  Patient presents with  . Chest Pain    (Consider location/radiation/quality/duration/timing/severity/associated sxs/prior treatment) The history is provided by the patient.   patient is a 46 year old female onset of chest discomfort last evening at 7 PM was sent her central chest pressure move to the right-sided chest and back discomfort worse at the 10:00 this morning while at work. Worse pain was 6/10 upon arrival here was 5/10. The onset of chest pain at 10 AM was associated with nausea vomiting diarrhea vomiting x2 nausea headache diarrhea x3. Associated with some dizziness. Chest pain described as an ache and thought maybe it was related to indigestion. No shortness of breath.  Past Medical History  Diagnosis Date  . GERD (gastroesophageal reflux disease)     Past Surgical History  Procedure Date  . Anterior cruciate ligament repair 1987,2001,2003,2005    Family History  Problem Relation Age of Onset  . Coronary artery disease Mother     PAD  . Heart attack Mother 47  . Diabetes Mother   . Lung cancer Father   . Heart attack Father 30  . Heart attack Brother 45  . Hepatitis Brother     Hep B  . Bipolar disorder Sister     History  Substance Use Topics  . Smoking status: Former Smoker    Quit date: 04/15/2004  . Smokeless tobacco: Not on file  . Alcohol Use: Yes     Comment: Socially    OB History    Grav Para Term Preterm Abortions TAB SAB Ect Mult Living                  Review of Systems  Constitutional: Negative for fever.  HENT: Negative for congestion.   Respiratory: Positive for chest tightness. Negative for shortness of breath.   Cardiovascular: Positive for chest pain. Negative for palpitations.  Gastrointestinal: Negative for nausea, vomiting and diarrhea.  Musculoskeletal: Positive for back pain.  Negative for myalgias.  Skin: Negative for rash.  Neurological: Positive for dizziness. Negative for headaches.  Hematological: Does not bruise/bleed easily.    Allergies  Review of patient's allergies indicates no known allergies.  Home Medications   Current Outpatient Rx  Name  Route  Sig  Dispense  Refill  . CYCLOBENZAPRINE HCL 10 MG PO TABS   Oral   Take 10 mg by mouth 3 (three) times daily as needed. For spasms         . ESCITALOPRAM OXALATE 20 MG PO TABS   Oral   Take 20 mg by mouth daily.         Marland Kitchen LORAZEPAM 0.5 MG PO TABS   Oral   Take 0.5 tablets (0.25 mg total) by mouth every 8 (eight) hours.   30 tablet   0   . METFORMIN HCL ER (MOD) 500 MG PO TB24   Oral   Take 1 tablet (500 mg total) by mouth daily with breakfast.   30 tablet   2   . OMEPRAZOLE 40 MG PO CPDR   Oral   Take 40 mg by mouth daily.           Marland Kitchen SIMVASTATIN 20 MG PO TABS      TAKE 1 TABLET (20 MG TOTAL) BY MOUTH AT BEDTIME.   30 tablet   5   .  TEMAZEPAM 30 MG PO CAPS   Oral   Take 30 mg by mouth at bedtime as needed. For sleep         . TRAMADOL HCL 50 MG PO TABS   Oral   Take 50 mg by mouth 2 (two) times daily as needed. For pain           BP 114/68  Pulse 94  Temp 97.8 F (36.6 C) (Oral)  Resp 17  SpO2 99%  Physical Exam  Nursing note and vitals reviewed. Constitutional: She is oriented to person, place, and time. She appears well-developed and well-nourished. No distress.  HENT:  Head: Normocephalic and atraumatic.  Mouth/Throat: Oropharynx is clear and moist.  Eyes: Conjunctivae normal and EOM are normal. Pupils are equal, round, and reactive to light.  Neck: Normal range of motion. Neck supple.  Cardiovascular: Normal rate and regular rhythm.   No murmur heard. Pulmonary/Chest: Effort normal and breath sounds normal. No respiratory distress. She has no wheezes. She has no rales. She exhibits no tenderness.  Abdominal: Soft. Bowel sounds are normal. There is  no tenderness.  Musculoskeletal: Normal range of motion. She exhibits no tenderness.  Neurological: She is alert and oriented to person, place, and time. No cranial nerve deficit. She exhibits normal muscle tone. Coordination normal.  Skin: Skin is warm. No rash noted.    ED Course  Procedures (including critical care time)  Labs Reviewed  CBC - Abnormal; Notable for the following:    WBC 14.5 (*)     All other components within normal limits  BASIC METABOLIC PANEL - Abnormal; Notable for the following:    Glucose, Bld 120 (*)     GFR calc non Af Amer 80 (*)     All other components within normal limits  POCT I-STAT TROPONIN I  D-DIMER, QUANTITATIVE  TROPONIN I   Dg Chest 2 View  04/06/2012  *RADIOLOGY REPORT*  Clinical Data: Chest pain  CHEST - 2 VIEW  Comparison:  None.  Findings:  The heart size and mediastinal contours are within normal limits.  Both lungs are clear.  The visualized skeletal structures are unremarkable.  IMPRESSION: No active cardiopulmonary disease.   Original Report Authenticated By: Janeece Riggers, M.D.     Date: 04/06/2012  Rate: 121  Rhythm: sinus tachycardia  QRS Axis: normal  Intervals: normal  ST/T Wave abnormalities: normal  Conduction Disutrbances:none  Narrative Interpretation:   Old EKG Reviewed: none available  Results for orders placed during the hospital encounter of 04/06/12  CBC      Component Value Range   WBC 14.5 (*) 4.0 - 10.5 K/uL   RBC 4.79  3.87 - 5.11 MIL/uL   Hemoglobin 13.8  12.0 - 15.0 g/dL   HCT 21.3  08.6 - 57.8 %   MCV 90.6  78.0 - 100.0 fL   MCH 28.8  26.0 - 34.0 pg   MCHC 31.8  30.0 - 36.0 g/dL   RDW 46.9  62.9 - 52.8 %   Platelets 246  150 - 400 K/uL  BASIC METABOLIC PANEL      Component Value Range   Sodium 137  135 - 145 mEq/L   Potassium 4.3  3.5 - 5.1 mEq/L   Chloride 98  96 - 112 mEq/L   CO2 27  19 - 32 mEq/L   Glucose, Bld 120 (*) 70 - 99 mg/dL   BUN 16  6 - 23 mg/dL   Creatinine, Ser 4.13  0.50 - 1.10  mg/dL   Calcium 9.5  8.4 - 16.1 mg/dL   GFR calc non Af Amer 80 (*) >90 mL/min   GFR calc Af Amer >90  >90 mL/min  POCT I-STAT TROPONIN I      Component Value Range   Troponin i, poc 0.00  0.00 - 0.08 ng/mL   Comment 3           D-DIMER, QUANTITATIVE      Component Value Range   D-Dimer, Quant 0.41  0.00 - 0.48 ug/mL-FEU     1. Chest pain   2. Gastroenteritis       MDM   Patient with onset of the chest discomfort around 7 PM yesterday. Central chest area moved to right chest and then to back at 10:00 this morning. Worse pain was 6/10 currently 5/10. The worsening of the chest pain was associated with vomiting x2 nausea headache and diarrhea x3. Patient's primary care Dr. is Dr. Clifton Custard who is with [facility].  Patient's workup in first department for: Her was negative EKG without acute changes d-dimer was negative. Electrolytes normal white blood cell count was a leukocytosis of 14.5. EKG initially was a sinus tachycardia cardiac monitor now his heart rate down below 100. Patient's chest x-ray negative for pneumonia or pneumothorax. Patient's symptoms are very atypical particularly with the nausea vomiting and diarrhea that occur to 10 with some worsening of the chest pain could represent some reflux disease which the patient does have a known history of that. She does have some cardiac risk factors however though that include diabetes.  The nausea vomiting and diarrhea aspect is suggestive of a viral gastroenteritis.  If second troponin is negative that would be 6 hours from the onset of the event o'clock this morning patient can be discharged home.  Second troponin is negative. We'll discharge patient home with treatment for gastroenteritis. Followup with her primary care Dr.      Shelda Jakes, MD 04/06/12 0960  Shelda Jakes, MD 04/08/12 [phone]

## 2012-04-06 NOTE — ED Notes (Signed)
Pt reports some jaw tingling this am with chest pain

## 2012-04-06 NOTE — ED Notes (Signed)
PT had chest pain last nite that felt like indigestion.  Pt states feeling never went away and feels like someone has punched her in the center of the chest.  Pt thinks anxious but not short of breath.  Pt vomited twice and had diarrhea at the same time.

## 2012-04-06 NOTE — Telephone Encounter (Signed)
Called regarding acute chest pain/discomfort.  Declined 911.  No answer at time of RN call back.  Left message per answering machine to call if still needs assistance.

## 2012-04-06 NOTE — Telephone Encounter (Signed)
I called pt back and she disconnected call with CAN due to having to go to bathroom to vomit. Pt started with Chest pain last night; BP was elevated over weekend; today chest pain in mid chest and right sided chest pain that goes to her back. Pt vomited,was dizzy and clammy. Pt got in car and was driving herself to [facility] ER when I called pt.Pt was still having chest pain radiating into back and now discomfort in pts jaw. Advised pt to stop car and I would call 911. Pt said she was turning on [employer] at [employer]. I stayed on phone with pt until she got to [facility] ER and valet took pt inside ER.Pt said was talking with front desk personnel now and ended call.

## 2012-04-06 NOTE — Telephone Encounter (Signed)
Called patient, no answer, left message asking patient to call with update.

## 2012-04-06 NOTE — Telephone Encounter (Signed)
Noted, thanks.  Will route to PCP at [facility].

## 2012-04-24 ENCOUNTER — Other Ambulatory Visit: Payer: Self-pay | Admitting: Family Medicine

## 2012-04-24 DIAGNOSIS — R739 Hyperglycemia, unspecified: Secondary | ICD-10-CM

## 2012-04-24 DIAGNOSIS — I10 Essential (primary) hypertension: Secondary | ICD-10-CM

## 2012-04-24 NOTE — Telephone Encounter (Signed)
[facility] 

## 2012-04-27 ENCOUNTER — Other Ambulatory Visit: Payer: Self-pay | Admitting: Family Medicine

## 2012-04-27 MED ORDER — ESCITALOPRAM OXALATE 20 MG PO TABS: 20.0000 mg | ORAL_TABLET | Freq: Every day | ORAL | Status: DC

## 2012-04-27 NOTE — Telephone Encounter (Signed)
Pt called and stated that refill was called in on 04/24/12 for Lexapro 10mg  instead of Lexapro 20mg  and insurance will not pay for this.  Please advise.

## 2012-04-28 ENCOUNTER — Other Ambulatory Visit (INDEPENDENT_AMBULATORY_CARE_PROVIDER_SITE_OTHER): Payer: 59

## 2012-04-28 DIAGNOSIS — R7309 Other abnormal glucose: Secondary | ICD-10-CM

## 2012-04-28 DIAGNOSIS — I10 Essential (primary) hypertension: Secondary | ICD-10-CM

## 2012-04-28 DIAGNOSIS — R739 Hyperglycemia, unspecified: Secondary | ICD-10-CM

## 2012-04-28 LAB — COMPREHENSIVE METABOLIC PANEL
ALT: 25 U/L (ref 0–35)
AST: 16 U/L (ref 0–37)
Albumin: 4.1 g/dL (ref 3.5–5.2)
CO2: 27 mEq/L (ref 19–32)
Calcium: 9 mg/dL (ref 8.4–10.5)
Chloride: 102 mEq/L (ref 96–112)
Creatinine, Ser: 0.9 mg/dL (ref 0.4–1.2)
GFR: 76.83 mL/min (ref >60.00)
Potassium: 4.3 mEq/L (ref 3.5–5.1)
Sodium: 136 mEq/L (ref 135–145)
Total Protein: 7.7 g/dL (ref 6.0–8.3)

## 2012-04-29 ENCOUNTER — Telehealth: Payer: Self-pay

## 2012-04-29 NOTE — Telephone Encounter (Signed)
Pt cked with [facility] 04/29/11 was told no rx for lexapro 20 mg. I called [facility].Medication phoned to [facility] as instructed. Pt will pick up rx.

## 2012-04-30 ENCOUNTER — Other Ambulatory Visit: Payer: 59

## 2012-05-01 ENCOUNTER — Other Ambulatory Visit: Payer: 59

## 2012-05-08 ENCOUNTER — Ambulatory Visit: Payer: 59 | Admitting: Family Medicine

## 2012-05-11 ENCOUNTER — Ambulatory Visit: Payer: 59 | Admitting: Family Medicine

## 2012-05-13 ENCOUNTER — Ambulatory Visit: Payer: 59 | Admitting: Family Medicine

## 2012-05-21 ENCOUNTER — Encounter: Payer: Self-pay | Admitting: Family Medicine

## 2012-05-21 ENCOUNTER — Ambulatory Visit (INDEPENDENT_AMBULATORY_CARE_PROVIDER_SITE_OTHER): Payer: 59 | Admitting: Family Medicine

## 2012-05-21 VITALS — BP 150/94 | HR 96 | Temp 98.4°F | Wt 257.0 lb

## 2012-05-21 DIAGNOSIS — F419 Anxiety disorder, unspecified: Secondary | ICD-10-CM

## 2012-05-21 DIAGNOSIS — F411 Generalized anxiety disorder: Secondary | ICD-10-CM

## 2012-05-21 DIAGNOSIS — I10 Essential (primary) hypertension: Secondary | ICD-10-CM

## 2012-05-21 MED ORDER — LORAZEPAM 0.5 MG PO TABS: ORAL_TABLET | ORAL | Status: DC

## 2012-05-21 MED ORDER — LISINOPRIL 10 MG PO TABS: 10.0000 mg | ORAL_TABLET | Freq: Every day | ORAL | Status: DC

## 2012-05-21 NOTE — Progress Notes (Signed)
Subjective:    Patient ID: Brenda Obrien, female    DOB: [birth year], 47 y.o.   MRN: [id number]  HPI  47 yo very pleasant female here for:  BP- continues to deteriorated.  Brings in log book- BP at home in 140s/80s-90s.  At work, running in 150s/90s .  She is sometimes symptomatic with these elevated readings- HA, facial flushing.   No CP or SOB.   She does she a therapist she is very happy with.  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  Has been demoted and hired an Pensions consultant.  She dreads going to work.  On Lexapro 20 mg daily with as needed Ativan.  She has been taking this routinely.  It helps a little but she is still quite tearful and anxious at work.  She has a very good support system at home.  No SI or HI.    Patient Active Problem List  Diagnosis  . GERD  . SWELLING OF LIMB  . FATIGUE  . Tachycardia  . Dyslipidemia  . Sore throat  . Hypertension  . Hyperglycemia  . Anxiety   Past Medical History  Diagnosis Date  . GERD (gastroesophageal reflux disease)    Past Surgical History  Procedure Date  . Anterior cruciate ligament repair 1987,2001,2003,2005   History  Substance Use Topics  . Smoking status: Former Smoker    Quit date: 04/15/2004  . Smokeless tobacco: Not on file  . Alcohol Use: Yes     Comment: Socially   Family History  Problem Relation Age of Onset  . Coronary artery disease Mother     PAD  . Heart attack Mother 33  . Diabetes Mother   . Lung cancer Father   . Heart attack Father 30  . Heart attack Brother 45  . Hepatitis Brother     Hep B  . Bipolar disorder Sister    No Known Allergies Current Outpatient Prescriptions on File Prior to Visit  Medication Sig Dispense Refill  . cyclobenzaprine (FLEXERIL) 10 MG tablet Take 10 mg by mouth 3 (three) times daily as needed. For spasms      . escitalopram (LEXAPRO) 20 MG tablet Take 1 tablet (20 mg total) by mouth daily.  30 tablet  2  . loperamide (IMODIUM A-D) 2 MG  tablet Take 1 tablet (2 mg total) by mouth 4 (four) times daily as needed for diarrhea or loose stools.  30 tablet  0  . LORazepam (ATIVAN) 0.5 MG tablet TAKE 1/2 TABLET BY MOUTH EVERY 8 HOURS  30 tablet  0  . metFORMIN (GLUMETZA) 500 MG (MOD) 24 hr tablet Take 1 tablet (500 mg total) by mouth daily with breakfast.  30 tablet  2  . omeprazole (PRILOSEC) 40 MG capsule Take 40 mg by mouth daily.        . simvastatin (ZOCOR) 20 MG tablet TAKE 1 TABLET (20 MG TOTAL) BY MOUTH AT BEDTIME.  30 tablet  5  . simvastatin (ZOCOR) 20 MG tablet TAKE 1 TABLET (20 MG TOTAL) BY MOUTH AT BEDTIME.  30 tablet  5  . temazepam (RESTORIL) 30 MG capsule Take 30 mg by mouth at bedtime as needed. For sleep      . traMADol (ULTRAM) 50 MG tablet Take 50 mg by mouth 2 (two) times daily as needed. For pain       Review of Systems Per HPI    Objective:   Physical Exam  BP 150/94  Pulse 96  Temp 98.4 F (36.9 C)  Wt 257 lb (116.574 kg)   General:  Well-developed,well-nourished,in no acute distress; alert,appropriate and cooperative throughout examination Psych:  Cognition and judgment appear intact. Alert and cooperative with normal attention span and concentration. No apparent delusions, illusions, hallucinations Tearful  Assessment & Plan:   1. Hypertension  Deteriorated. Start Lisinopril 10 mg daily. Follow up in 2 weeks. The patient indicates understanding of these issues and agrees with the plan.   2. Anxiety  Deteriorated but I suspect there is no medication that will improve her symptoms given her current job situation.  Pt agrees with that.  She feels she "can get through this" with a little time.   Continue current dose of Lexapro with as needed Ativan.  She will consider returning to psychotherapy as well.

## 2012-05-21 NOTE — Patient Instructions (Signed)
It was good to see you. We are starting lisinopril 10 mg daily.  Please come see me in 2 weeks for a follow up.  Hang in there.

## 2012-05-25 ENCOUNTER — Encounter: Payer: Self-pay | Admitting: Obstetrics & Gynecology

## 2012-06-04 ENCOUNTER — Ambulatory Visit (INDEPENDENT_AMBULATORY_CARE_PROVIDER_SITE_OTHER): Payer: 59 | Admitting: Family Medicine

## 2012-06-04 ENCOUNTER — Encounter: Payer: Self-pay | Admitting: Family Medicine

## 2012-06-04 VITALS — BP 130/80 | HR 80 | Temp 98.4°F | Wt 252.0 lb

## 2012-06-04 DIAGNOSIS — F419 Anxiety disorder, unspecified: Secondary | ICD-10-CM

## 2012-06-04 DIAGNOSIS — F411 Generalized anxiety disorder: Secondary | ICD-10-CM

## 2012-06-04 DIAGNOSIS — I10 Essential (primary) hypertension: Secondary | ICD-10-CM

## 2012-06-04 NOTE — Patient Instructions (Addendum)
Good to see you. Your blood pressure is fantastic.  Let's continue low dose lisinopril.

## 2012-06-04 NOTE — Progress Notes (Signed)
Subjective:    Patient ID: Brenda Obrien, female    DOB: [birth year], 47 y.o.   MRN: [id number]  HPI  47 yo very pleasant female here for two week follow up.  BP- continued to increase, mainly at work, but now also at home.  We therefore started Lisinopril 10 mg daily at last office visit.    Now normotensive and she feels much better.  Has been normotensive at home too.    No longer having facial flushing.  Denies any side effects- no dry cough.  Anxiety-  On Lexapro 20 mg daily with as needed Ativan.  She has been taking this routinely.  It helps a little but she is still quite tearful and anxious at work.  She has a very good support system at home.  She feels better since she started the legal process.    No SI or HI.    Patient Active Problem List  Diagnosis  . GERD  . Dyslipidemia  . Hypertension  . Hyperglycemia  . Anxiety   Past Medical History  Diagnosis Date  . GERD (gastroesophageal reflux disease)   . Asthma   . HSV-1 infection 06/1998   Past Surgical History  Procedure Laterality Date  . Anterior cruciate ligament repair  1987,2001,2003,2005  . Laparoscopic total hysterectomy  09/2009  . Hsv 1     History  Substance Use Topics  . Smoking status: Former Smoker    Quit date: 04/15/2004  . Smokeless tobacco: Not on file  . Alcohol Use: Yes     Comment: Socially   Family History  Problem Relation Age of Onset  . Coronary artery disease Mother     PAD  . Heart attack Mother 62  . Diabetes Mother   . Lung cancer Father   . Heart attack Father 30  . Heart attack Brother 45  . Hepatitis Brother     Hep B  . Bipolar disorder Sister    No Known Allergies Current Outpatient Prescriptions on File Prior to Visit  Medication Sig Dispense Refill  . cyclobenzaprine (FLEXERIL) 10 MG tablet Take 10 mg by mouth 3 (three) times daily as needed. For spasms      . escitalopram (LEXAPRO) 20 MG tablet Take 1 tablet (20 mg total) by mouth daily.  30 tablet  2  .  lisinopril (PRINIVIL,ZESTRIL) 10 MG tablet Take 1 tablet (10 mg total) by mouth daily.  90 tablet  3  . loperamide (IMODIUM A-D) 2 MG tablet Take 1 tablet (2 mg total) by mouth 4 (four) times daily as needed for diarrhea or loose stools.  30 tablet  0  . LORazepam (ATIVAN) 0.5 MG tablet TAKE 1TABLET BY MOUTH EVERY 8 HOURS AS NEEDED FOR ANXIETY  60 tablet  0  . metFORMIN (GLUMETZA) 500 MG (MOD) 24 hr tablet Take 1 tablet (500 mg total) by mouth daily with breakfast.  30 tablet  2  . omeprazole (PRILOSEC) 40 MG capsule Take 40 mg by mouth daily.        . simvastatin (ZOCOR) 20 MG tablet TAKE 1 TABLET (20 MG TOTAL) BY MOUTH AT BEDTIME.  30 tablet  5  . simvastatin (ZOCOR) 20 MG tablet TAKE 1 TABLET (20 MG TOTAL) BY MOUTH AT BEDTIME.  30 tablet  5  . temazepam (RESTORIL) 30 MG capsule Take 30 mg by mouth at bedtime as needed. For sleep      . traMADol (ULTRAM) 50 MG tablet Take 50 mg by mouth 2 (two)  times daily as needed. For pain      . Vitamin D, Ergocalciferol, (DRISDOL) 50000 UNITS CAPS Take 50,000 Units by mouth once a week.       No current facility-administered medications on file prior to visit.   Review of Systems Per HPI    Objective:   Physical Exam  BP 130/80  Pulse 80  Temp(Src) 98.4 F (36.9 C)  Wt 252 lb (114.306 kg)  BMI 40.69 kg/m2  General:  Well-developed,well-nourished,in no acute distress; alert,appropriate and cooperative throughout examination Psych:  Cognition and judgment appear intact. Alert and cooperative with normal attention span and concentration. No apparent delusions, illusions, hallucinations Tearful  Assessment & Plan:   1. Anxiety Stable on current meds. Continue psychotherapy.  2. Hypertension Improved on Lisinopril 10 mg daily. Follow up prn.

## 2012-06-22 ENCOUNTER — Telehealth: Payer: Self-pay

## 2012-06-22 NOTE — Telephone Encounter (Signed)
Pt said to tell Dr Dayton Martes that she won with back pay. Pt said Dr Dayton Martes would understand and pt does not need call back.

## 2012-06-22 NOTE — Telephone Encounter (Signed)
Good for her.  This is great news.

## 2012-06-24 ENCOUNTER — Encounter: Payer: Self-pay | Admitting: Family Medicine

## 2012-06-24 ENCOUNTER — Ambulatory Visit (INDEPENDENT_AMBULATORY_CARE_PROVIDER_SITE_OTHER): Payer: 59 | Admitting: Family Medicine

## 2012-06-24 ENCOUNTER — Ambulatory Visit (INDEPENDENT_AMBULATORY_CARE_PROVIDER_SITE_OTHER)
Admission: RE | Admit: 2012-06-24 | Discharge: 2012-06-24 | Disposition: A | Discharge status: Home / Self Care | Payer: 59 | Source: Ambulatory Visit | Attending: Family Medicine | Admitting: Family Medicine

## 2012-06-24 VITALS — BP 130/84 | HR 97 | Temp 98.6°F | Ht 66.0 in | Wt 250.5 lb

## 2012-06-24 DIAGNOSIS — M546 Pain in thoracic spine: Secondary | ICD-10-CM

## 2012-06-24 NOTE — Progress Notes (Signed)
   Patient Name: Brenda Obrien Date of Birth: 1965-09-24 Medical Record Number: [id number] Gender: female  PCP: Ruthe Mannan, MD  History of Present Illness:  Brenda Obrien is a 47 y.o. very pleasant female patient who presents with the following: Back Pain  ongoing for approximately: 2 weeks The patient has had back pain before. The back pain is localized into the thoracic spine area. They also describe no radiculopathy. 2 weeks ago, was up in [place]. Middle of back hit. Also hit head.   When hit, arms flew open.  Has been getting better.   No numbness or tingling. No bowel or bladder incontinence. No focal weakness. Prior interventions: none - flexeril and tramadol Physical therapy: No Chiropractic manipulations: No Acupuncture: No Osteopathic manipulation: No Heat or cold: Minimal effect  Past Medical History, Surgical History, Family History, Medications, Allergies have been reviewed and updated if relevant.  Review of Systems  GEN: No fevers, chills. Nontoxic. Primarily MSK c/o today. MSK: Detailed in the HPI GI: tolerating PO intake without difficulty Neuro: As above  Otherwise the pertinent positives of the ROS are noted above.    Physical Exam  Filed Vitals:   06/24/12 0756  BP: 130/84  Pulse: 97  Temp: 98.6 F (37 C)  TempSrc: Oral  Height: 5\' 6"  (1.676 m)  Weight: 250 lb 8 oz (113.626 kg)  SpO2: 99%    Gen: Well-developed,well-nourished,in no acute distress; alert,appropriate and cooperative throughout examination HEENT: Normocephalic and atraumatic without obvious abnormalities.  Ears, externally no deformities Pulm: Breathing comfortably in no respiratory distress Range of motion at  the waist: Flexion, rotation and lateral bending: limited in most directions  No echymosis or edema Rises to examination table with no difficulty Gait: minimally antalgic  Inspection/Deformity: No abnormality Paraspinus T:  TTP on the LEFT around T8-T11  B Ankle  Dorsiflexion (L5,4): 5/5 B Great Toe Dorsiflexion (L5,4): 5/5 Heel Walk (L5): WNL Toe Walk (S1): WNL Rise/Squat (L4): WNL, mild pain  SENSORY B Medial Foot (L4): WNL B Dorsum (L5): WNL B Lateral (S1): WNL Light Touch: WNL  B SLR, seated: neg B Greater Troch: NT B Sciatic Notch: NT   Assessment and Plan:  Back pain  Thoracic spine pain - Plan: DG Thoracic Spine W/Swimmers   Trauma, contusion, bone contusion, cannot exclude compression fx  Cont ROM, Heat, flexeril and tramadol. Not tolerant of NSAIDS

## 2012-07-02 ENCOUNTER — Encounter: Payer: Self-pay | Admitting: Obstetrics & Gynecology

## 2012-07-02 ENCOUNTER — Ambulatory Visit (INDEPENDENT_AMBULATORY_CARE_PROVIDER_SITE_OTHER): Payer: 59 | Admitting: Obstetrics & Gynecology

## 2012-07-02 VITALS — BP 152/88 | Ht 66.0 in | Wt 251.2 lb

## 2012-07-02 DIAGNOSIS — Z0000 Encounter for general adult medical examination without abnormal findings: Secondary | ICD-10-CM

## 2012-07-02 DIAGNOSIS — Z01419 Encounter for gynecological examination (general) (routine) without abnormal findings: Secondary | ICD-10-CM

## 2012-07-02 LAB — POCT URINALYSIS DIPSTICK
Blood, UA: NEGATIVE
Ketones, UA: NEGATIVE
Protein, UA: NEGATIVE
Urobilinogen, UA: NEGATIVE
pH, UA: 5

## 2012-07-02 MED ORDER — OMEPRAZOLE 40 MG PO CPDR: 40.0000 mg | DELAYED_RELEASE_CAPSULE | Freq: Every day | ORAL | Status: DC

## 2012-07-02 MED ORDER — TEMAZEPAM 30 MG PO CAPS: 30.0000 mg | ORAL_CAPSULE | Freq: Every evening | ORAL | Status: DC | PRN

## 2012-07-02 MED ORDER — CYCLOBENZAPRINE HCL 10 MG PO TABS: 10.0000 mg | ORAL_TABLET | Freq: Three times a day (TID) | ORAL | Status: DC | PRN

## 2012-07-02 NOTE — Progress Notes (Addendum)
47 y.o. G0P0000 SingleCaucasianF here for annual exam.  She is doing well.  Stopped night shift at work.  She is very happy about this. Did have ER visit due to chest pains.  Evaluation was negative.  Diagnosis was panic.   No LMP recorded. Patient has had a hysterectomy.           Sexually active: no  The current method of family planning is status post hysterectomy.    Exercising: yes  Home exercise routine includes eliptical.. Smoker:  no  Health Maintenance: Pap:  02/14/2009 MMG:  06/15/2010 Colonoscopy:  N/A BMD:   N/A TDaP:  2006   reports that she quit smoking about 8 years ago. She does not have any smokeless tobacco history on file. She reports that  drinks alcohol. She reports that she does not use illicit drugs.  Past Medical History  Diagnosis Date  . GERD (gastroesophageal reflux disease)   . Asthma   . 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 Hypertension   . Elevated cholesterol     Past Surgical History  Procedure Laterality Date  . Anterior cruciate ligament repair  1987,2001,2003,2005  . Laparoscopic total hysterectomy  09/2009  . Hsv 1    . Abdominal hysterectomy    . Left knee surgery  09/2011    Current Outpatient Prescriptions  Medication Sig Dispense Refill  . cyclobenzaprine (FLEXERIL) 10 MG tablet Take 10 mg by mouth 3 (three) times daily as needed. For spasms      . escitalopram (LEXAPRO) 20 MG tablet Take 1 tablet (20 mg total) by mouth daily.  30 tablet  2  . lisinopril (PRINIVIL,ZESTRIL) 10 MG tablet Take 1 tablet (10 mg total) by mouth daily.  90 tablet  3  . LORazepam (ATIVAN) 0.5 MG tablet TAKE 1TABLET BY MOUTH EVERY 8 HOURS AS NEEDED FOR ANXIETY  60 tablet  0  . omeprazole (PRILOSEC) 40 MG capsule Take 40 mg by mouth daily.        . simvastatin (ZOCOR) 20 MG tablet TAKE 1 TABLET (20 MG TOTAL) BY MOUTH AT BEDTIME.  30 tablet  5  . temazepam (RESTORIL) 30 MG capsule Take 30 mg by mouth at bedtime as needed. For sleep      . traMADol (ULTRAM) 50 MG tablet Take  50 mg by mouth 2 (two) times daily as needed. For pain      . Vitamin D, Ergocalciferol, (DRISDOL) 50000 UNITS CAPS Take 50,000 Units by mouth once a week.       No current facility-administered medications for this visit.    Family History  Problem Relation Age of Onset  . Coronary artery disease Mother     PAD  . Heart attack Mother 53  . Diabetes Mother   . Lung cancer Father   . Heart attack Father 30  . Heart attack Brother 45  . Hepatitis Brother     Hep B  . Bipolar disorder Sister     ROS:  Pertinent items are noted in HPI.  Otherwise, a comprehensive ROS was negative.  Exam:   BP 152/88  Ht 5\' 6"  (1.676 m)  Wt 251 lb 3.2 oz (113.944 kg)  BMI 40.56 kg/m2  (Pt has not taken BP meds today) Height: 5\' 6"  (167.6 cm)  Ht Readings from Last 3 Encounters:  07/02/12 5\' 6"  (1.676 m)  06/24/12 5\' 6"  (1.676 m)  02/03/12 5\' 6"  (1.676 m)    General appearance: alert, cooperative and appears stated age Head: Normocephalic, without  obvious abnormality, atraumatic Neck: no adenopathy, supple, symmetrical, trachea midline and thyroid not enlarged, symmetric, no tenderness/mass/nodules Lungs: clear to auscultation bilaterally Breasts: Inspection negative, No nipple retraction or dimpling, No nipple discharge or bleeding, No axillary or supraclavicular adenopathy, Normal to palpation without dominant masses Heart: regular rate and rhythm Abdomen: soft, non-tender; bowel sounds normal; no masses,  no organomegaly Extremities: extremities normal, atraumatic, no cyanosis or edema Skin: Skin color, texture, turgor normal. No rashes or lesions Lymph nodes: Cervical, supraclavicular, and axillary nodes normal. No abnormal inguinal nodes palpated Neurologic: Grossly normal   Pelvic: External genitalia:  no lesions              Urethra:  normal appearing urethra with no masses, tenderness or lesions              Bartholins and Skenes: normal                 Vagina: normal appearing  vagina with normal color and discharge, no lesions              Cervix: absent              Pap taken: no Bimanual Exam:  Uterus:  uterus absent              Adnexa: normal adnexa               Rectovaginal: Confirms               Anus:  normal sphincter tone, no lesions  A:  Well Woman with normal exam,  H/O robotic assisted TLH  P:   Mammogram--pt KNOWS to schedule.  Discussed 3D MMG. counseled on importance of mammography screening, adequate intake of calcium and vitamin D Prescriptions for flexeril, Restoril, and omeprazole given return annually or prn due to compliance, will switch to 1000IU vit D daily  An After Visit Summary was printed and given to the patient.

## 2012-07-02 NOTE — Patient Instructions (Signed)
Return one year or with new gynecologic problems.

## 2012-07-06 ENCOUNTER — Other Ambulatory Visit: Payer: Self-pay | Admitting: Nurse Practitioner

## 2012-07-08 ENCOUNTER — Other Ambulatory Visit: Payer: Self-pay | Admitting: Nurse Practitioner

## 2012-07-08 DIAGNOSIS — Z87898 Personal history of other specified conditions: Secondary | ICD-10-CM

## 2012-07-08 NOTE — Telephone Encounter (Signed)
Please advise last refill given 07/26/2010 #4/2 rfs

## 2012-07-09 ENCOUNTER — Other Ambulatory Visit: Payer: Self-pay | Admitting: *Deleted

## 2012-07-09 MED ORDER — SCOPOLAMINE 1 MG/3DAYS TD PT72: 1.0000 | MEDICATED_PATCH | TRANSDERMAL | Status: DC

## 2012-07-09 NOTE — Telephone Encounter (Signed)
Jasmine pt was just here for exam with Dr. Hyacinth Meeker, OK to refill rx for patch X 1

## 2012-07-09 NOTE — Telephone Encounter (Signed)
OK to refill

## 2012-07-10 ENCOUNTER — Other Ambulatory Visit: Payer: Self-pay | Admitting: *Deleted

## 2012-07-10 NOTE — Telephone Encounter (Signed)
Transderm Patch #4/1 RF'S sent to [facility] pharmacy

## 2012-08-04 ENCOUNTER — Other Ambulatory Visit: Payer: Self-pay

## 2012-08-04 DIAGNOSIS — Z1231 Encounter for screening mammogram for malignant neoplasm of breast: Secondary | ICD-10-CM

## 2012-08-13 HISTORY — PX: LAPAROSCOPIC CHOLECYSTECTOMY: SUR755

## 2012-08-17 ENCOUNTER — Other Ambulatory Visit: Payer: Self-pay | Admitting: Family Medicine

## 2012-08-24 ENCOUNTER — Ambulatory Visit
Admission: RE | Admit: 2012-08-24 | Discharge: 2012-08-24 | Disposition: A | Discharge status: Home / Self Care | Payer: 59 | Source: Ambulatory Visit

## 2012-08-24 DIAGNOSIS — Z1231 Encounter for screening mammogram for malignant neoplasm of breast: Secondary | ICD-10-CM

## 2012-09-19 ENCOUNTER — Other Ambulatory Visit: Payer: Self-pay | Admitting: Family Medicine

## 2012-10-02 ENCOUNTER — Ambulatory Visit (INDEPENDENT_AMBULATORY_CARE_PROVIDER_SITE_OTHER): Payer: 59 | Admitting: Family Medicine

## 2012-10-02 ENCOUNTER — Encounter: Payer: Self-pay | Admitting: Family Medicine

## 2012-10-02 VITALS — BP 140/82 | HR 80 | Temp 98.3°F | Wt 243.0 lb

## 2012-10-02 DIAGNOSIS — H6692 Otitis media, unspecified, left ear: Secondary | ICD-10-CM

## 2012-10-02 DIAGNOSIS — H6690 Otitis media, unspecified, unspecified ear: Secondary | ICD-10-CM

## 2012-10-02 MED ORDER — AMOXICILLIN-POT CLAVULANATE 875-125 MG PO TABS: 1.0000 | ORAL_TABLET | Freq: Two times a day (BID) | ORAL | Status: DC

## 2012-10-02 MED ORDER — FLUCONAZOLE 150 MG PO TABS: 150.0000 mg | ORAL_TABLET | Freq: Once | ORAL | Status: DC

## 2012-10-02 NOTE — Patient Instructions (Addendum)
Great to see you, Brenda Obrien. I'm so glad work is going well. Take Augmentin as directed- 1 tablet twice daily x 10 days. Ok to take Sudafed (with caution since you have high blood pressure).  Call me next week with an update.

## 2012-10-02 NOTE — Progress Notes (Signed)
SUBJECTIVE:  Brenda Obrien is a 47 y.o. female who complains of coryza, congestion, sneezing, left ear pain and bilateral sinus pain for 30 days. She denies a history of anorexia, chest pain, chills and dizziness and denies a history of asthma. Patient denies smoke cigarettes.   Patient Active Problem List   Diagnosis Date Noted  . Anxiety 03/13/2012  . Hyperglycemia 02/03/2012  . Hypertension 01/21/2012  . Dyslipidemia 11/22/2010  . GERD 03/27/2009   Past Medical History  Diagnosis Date  . GERD (gastroesophageal reflux disease)   . Asthma   . 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 Hypertension   . Elevated cholesterol    Past Surgical History  Procedure Laterality Date  . Anterior cruciate ligament repair  1987,2001,2003,2005  . Laparoscopic total hysterectomy  09/2009  . Hsv 1    . Abdominal hysterectomy    . Left knee surgery  09/2011   History  Substance Use Topics  . Smoking status: Former Smoker    Quit date: 04/15/2004  . Smokeless tobacco: Not on file  . Alcohol Use: Yes     Comment: Socially   Family History  Problem Relation Age of Onset  . Coronary artery disease Mother     PAD  . Heart attack Mother 66  . Diabetes Mother   . Lung cancer Father   . Heart attack Father 30  . Heart attack Brother 45  . Hepatitis Brother     Hep B  . Bipolar disorder Sister    No Known Allergies Current Outpatient Prescriptions on File Prior to Visit  Medication Sig Dispense Refill  . cyclobenzaprine (FLEXERIL) 10 MG tablet Take 1 tablet (10 mg total) by mouth 3 (three) times daily as needed. For spasms  30 tablet  5  . escitalopram (LEXAPRO) 20 MG tablet TAKE 1 TABLET EVERY DAY  30 tablet  2  . lisinopril (PRINIVIL,ZESTRIL) 10 MG tablet Take 1 tablet (10 mg total) by mouth daily.  90 tablet  3  . LORazepam (ATIVAN) 0.5 MG tablet TAKE 1TABLET BY MOUTH EVERY 8 HOURS AS NEEDED FOR ANXIETY  60 tablet  0  . omeprazole (PRILOSEC) 40 MG capsule Take 1 capsule (40 mg total) by mouth daily.   30 capsule  13  . scopolamine (TRANSDERM-SCOP) 1.5 MG Place 1 patch (1.5 mg total) onto the skin every 3 (three) days.  4 patch  1  . simvastatin (ZOCOR) 20 MG tablet TAKE 1 TABLET (20 MG TOTAL) BY MOUTH AT BEDTIME.  30 tablet  5  . temazepam (RESTORIL) 30 MG capsule Take 1 capsule (30 mg total) by mouth at bedtime as needed. For sleep  30 capsule  5  . traMADol (ULTRAM) 50 MG tablet Take 50 mg by mouth 2 (two) times daily as needed. For pain      . TRANSDERM-SCOP 1.5 MG USE AS DIRECTED  4 patch  0  . Vitamin D, Ergocalciferol, (DRISDOL) 50000 UNITS CAPS Take 50,000 Units by mouth once a week.       No current facility-administered medications on file prior to visit.   The PMH, PSH, Social History, Family History, Medications, and allergies have been reviewed in [facility], and have been updated if relevant.  OBJECTIVE: BP 140/82  Pulse 80  Temp(Src) 98.3 F (36.8 C)  Wt 243 lb (110.224 kg)  BMI 39.24 kg/m2  She appears well, vital signs are as noted. Left tm dull and erythematous  Throat and pharynx normal.  Neck supple. No adenopathy in the neck.  Nose is congested. Sinuses non tender. The chest is clear, without wheezes or rales.  ASSESSMENT:  otitis media  PLAN: Augmentin twice daily x 10 days, sudafed for short period of time (2-3 days). Symptomatic therapy suggested: push fluids, rest and return office visit prn if symptoms persist or worsen.  Call or return to clinic prn if these symptoms worsen or fail to improve as anticipated.

## 2012-11-05 ENCOUNTER — Other Ambulatory Visit: Payer: Self-pay | Admitting: Obstetrics & Gynecology

## 2012-11-21 ENCOUNTER — Other Ambulatory Visit: Payer: Self-pay | Admitting: Family Medicine

## 2012-12-14 HISTORY — PX: REPLACEMENT TOTAL KNEE: SUR1224

## 2012-12-23 DIAGNOSIS — F329 Major depressive disorder, single episode, unspecified: Secondary | ICD-10-CM | POA: Insufficient documentation

## 2012-12-23 DIAGNOSIS — F32A Depression, unspecified: Secondary | ICD-10-CM | POA: Insufficient documentation

## 2013-01-08 ENCOUNTER — Encounter: Payer: Self-pay | Admitting: Obstetrics & Gynecology

## 2013-01-25 DIAGNOSIS — Z96659 Presence of unspecified artificial knee joint: Secondary | ICD-10-CM | POA: Insufficient documentation

## 2013-02-10 ENCOUNTER — Other Ambulatory Visit: Payer: Self-pay | Admitting: *Deleted

## 2013-02-10 NOTE — Telephone Encounter (Signed)
Faxed refill request received from [facility] pharmacy for TEMAZEPAM Last filled by MD on 07/02/12, #30 X 5 Last AEX - 07/02/12 Next AEX - 07/09/13 Please advise refills.  Thanks. [facility] fax [phone]

## 2013-02-11 ENCOUNTER — Other Ambulatory Visit: Payer: Self-pay | Admitting: *Deleted

## 2013-02-11 MED ORDER — TEMAZEPAM 30 MG PO CAPS: 30.0000 mg | ORAL_CAPSULE | Freq: Every evening | ORAL | Status: DC | PRN

## 2013-02-24 ENCOUNTER — Other Ambulatory Visit: Payer: Self-pay | Admitting: Family Medicine

## 2013-06-09 ENCOUNTER — Other Ambulatory Visit: Payer: Self-pay | Admitting: Internal Medicine

## 2013-06-09 ENCOUNTER — Other Ambulatory Visit: Payer: Self-pay | Admitting: Family Medicine

## 2013-06-09 NOTE — Telephone Encounter (Signed)
Last filled 02/2013 with 2 refills--Last OV 09/2012--please advise

## 2013-07-07 ENCOUNTER — Ambulatory Visit: Payer: Self-pay | Admitting: Obstetrics & Gynecology

## 2013-07-09 ENCOUNTER — Other Ambulatory Visit: Payer: Self-pay | Admitting: Family Medicine

## 2013-07-09 ENCOUNTER — Encounter: Payer: Self-pay | Admitting: Obstetrics & Gynecology

## 2013-07-09 ENCOUNTER — Ambulatory Visit (INDEPENDENT_AMBULATORY_CARE_PROVIDER_SITE_OTHER): Payer: 59 | Admitting: Obstetrics & Gynecology

## 2013-07-09 VITALS — BP 138/88 | HR 98 | Resp 20 | Ht 66.0 in | Wt 237.0 lb

## 2013-07-09 DIAGNOSIS — Z0000 Encounter for general adult medical examination without abnormal findings: Secondary | ICD-10-CM

## 2013-07-09 DIAGNOSIS — Z01419 Encounter for gynecological examination (general) (routine) without abnormal findings: Secondary | ICD-10-CM

## 2013-07-09 LAB — POCT URINALYSIS DIPSTICK
BILIRUBIN UA: NEGATIVE
Blood, UA: NEGATIVE
GLUCOSE UA: NEGATIVE
Ketones, UA: NEGATIVE
LEUKOCYTES UA: NEGATIVE
NITRITE UA: NEGATIVE
Protein, UA: NEGATIVE
UROBILINOGEN UA: NEGATIVE
pH, UA: 5

## 2013-07-09 LAB — COMPREHENSIVE METABOLIC PANEL
ALK PHOS: 103 U/L (ref 39–117)
ALT: 39 U/L — ABNORMAL HIGH (ref 0–35)
AST: 23 U/L (ref 0–37)
Albumin: 4.4 g/dL (ref 3.5–5.2)
BILIRUBIN TOTAL: 0.6 mg/dL (ref 0.2–1.2)
BUN: 11 mg/dL (ref 6–23)
CO2: 26 mEq/L (ref 19–32)
CREATININE: 0.79 mg/dL (ref 0.50–1.10)
Calcium: 9.4 mg/dL (ref 8.4–10.5)
Chloride: 99 mEq/L (ref 96–112)
Glucose, Bld: 86 mg/dL (ref 70–99)
Potassium: 4.1 mEq/L (ref 3.5–5.3)
Sodium: 137 mEq/L (ref 135–145)
Total Protein: 7.4 g/dL (ref 6.0–8.3)

## 2013-07-09 LAB — LIPID PANEL
CHOLESTEROL: 218 mg/dL — AB (ref 0–200)
HDL: 54 mg/dL (ref >39)
LDL CALC: 144 mg/dL — AB (ref 0–99)
Total CHOL/HDL Ratio: 4 Ratio
Triglycerides: 98 mg/dL (ref <150)
VLDL: 20 mg/dL (ref 0–40)

## 2013-07-09 LAB — HEMOGLOBIN, FINGERSTICK: Hemoglobin, fingerstick: 15.1 g/dL (ref 12.0–16.0)

## 2013-07-09 MED ORDER — ESCITALOPRAM OXALATE 20 MG PO TABS: ORAL_TABLET | ORAL | Status: DC

## 2013-07-09 MED ORDER — TEMAZEPAM 30 MG PO CAPS: 30.0000 mg | ORAL_CAPSULE | Freq: Every evening | ORAL | Status: DC | PRN

## 2013-07-09 MED ORDER — OMEPRAZOLE 40 MG PO CPDR: 40.0000 mg | DELAYED_RELEASE_CAPSULE | Freq: Every day | ORAL | Status: DC

## 2013-07-09 NOTE — Progress Notes (Signed)
48 y.o. G0P0000 SingleCaucasianF here for annual exam.  No vaginal bleeding.  Labs less than a year ago with Dr. Dayton MartesAron.  Has total knee in September and lap cholecystectomy 5/14.  Has done really well from both surgeries.    No LMP recorded. Patient has had a hysterectomy.          Sexually active: yes  The current method of family planning is none.    Exercising: No  The patient does not participate in regular exercise at present. Smoker:  Former  Health Maintenance: Pap:  2010 History of abnormal Pap:  yes MMG:  08/2012 BI-RADS 1: NEG Colonoscopy:  never BMD:   Never TDaP:  2006 Screening Labs: not today, Hb today: 15.1, Urine today: All neg   reports that she quit smoking about 9 years ago. She does not have any smokeless tobacco history on file. She reports that she drinks alcohol. She reports that she does not use illicit drugs.  Past Medical History  Diagnosis Date  . GERD (gastroesophageal reflux disease)   . Asthma   . 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 Hypertension   . Elevated cholesterol   . Endometriosis   . Depression     Past Surgical History  Procedure Laterality Date  . Anterior cruciate ligament repair  1987,2001,2003,2005  . Laparoscopic total hysterectomy  09/2009  . Hsv 1    . Abdominal hysterectomy    . Left knee surgery  09/2011    Current Outpatient Prescriptions  Medication Sig Dispense Refill  . amoxicillin-clavulanate (AUGMENTIN) 875-125 MG per tablet Take 1 tablet by mouth 2 (two) times daily.  20 tablet  0  . cholecalciferol (VITAMIN D) 1000 UNITS tablet Take 1,000 Units by mouth daily.      . cyclobenzaprine (FLEXERIL) 10 MG tablet Take 1 tablet (10 mg total) by mouth 3 (three) times daily as needed. For spasms  30 tablet  5  . escitalopram (LEXAPRO) 20 MG tablet TAKE 1 TABLET BY MOUTH EVERY DAY  30 tablet  2  . fluconazole (DIFLUCAN) 150 MG tablet Take 1 tablet (150 mg total) by mouth once.  1 tablet  1  . lisinopril (PRINIVIL,ZESTRIL) 10 MG tablet TAKE  1 TABLET (10 MG TOTAL) BY MOUTH DAILY.  30 tablet  0  . LORazepam (ATIVAN) 0.5 MG tablet TAKE 1TABLET BY MOUTH EVERY 8 HOURS AS NEEDED FOR ANXIETY  60 tablet  0  . omeprazole (PRILOSEC) 40 MG capsule Take 1 capsule (40 mg total) by mouth daily.  30 capsule  13  . omeprazole (PRILOSEC) 40 MG capsule TAKE 1 CAPSULE (40 MG TOTAL) BY MOUTH DAILY.  30 capsule  0  . simvastatin (ZOCOR) 20 MG tablet TAKE 1 TABLET BY MOUTH AT BEDTIME  30 tablet  0  . temazepam (RESTORIL) 30 MG capsule Take 1 capsule (30 mg total) by mouth at bedtime as needed. For sleep  30 capsule  5  . traMADol (ULTRAM) 50 MG tablet Take 50 mg by mouth 2 (two) times daily as needed. For pain      . TRANSDERM-SCOP 1.5 MG USE AS DIRECTED  4 patch  0   No current facility-administered medications for this visit.    Family History  Problem Relation Age of Onset  . Coronary artery disease Mother     PAD  . Heart attack Mother 8250  . Diabetes Mother   . Lung cancer Father   . Heart attack Father 30  . Heart attack Brother 45  .  Hepatitis Brother     Hep B  . Bipolar disorder Sister     ROS:  Pertinent items are noted in HPI.  Otherwise, a comprehensive ROS was negative.  Exam:   BP 138/88  Pulse 98  Resp 20  Ht 5\' 6"  (1.676 m)  Wt 237 lb (107.502 kg)  BMI 38.27 kg/m2  Weight change: +8lb Height: 5\' 6"  (167.6 cm)  Ht Readings from Last 3 Encounters:  07/09/13 5\' 6"  (1.676 m)  07/02/12 5\' 6"  (1.676 m)  06/24/12 5\' 6"  (1.676 m)    General appearance: alert, cooperative and appears stated age Head: Normocephalic, without obvious abnormality, atraumatic Neck: no adenopathy, supple, symmetrical, trachea midline and thyroid normal to inspection and palpation Lungs: clear to auscultation bilaterally Breasts: normal appearance, no masses or tenderness Heart: regular rate and rhythm Abdomen: soft, non-tender; bowel sounds normal; no masses,  no organomegaly Extremities: extremities normal, atraumatic, no cyanosis or  edema Skin: Skin color, texture, turgor normal. No rashes or lesions Lymph nodes: Cervical, supraclavicular, and axillary nodes normal. No abnormal inguinal nodes palpated Neurologic: Grossly normal   Pelvic: External genitalia:  no lesions              Urethra:  normal appearing urethra with no masses, tenderness or lesions              Bartholins and Skenes: normal                 Vagina: normal appearing vagina with normal color and discharge, no lesions              Cervix: absent              Pap taken: no Bimanual Exam:  Uterus:  uterus absent              Adnexa: normal adnexa               Rectovaginal: Confirms               Anus:  normal sphincter tone, no lesions  A:  Well Woman with normal exam S/P robotic TLH 6/11 Insomnia Enlarged thyroid H/O depression but doing quite well  P:   Mammogram yearly.  D/W 3D due to Grade 3 density. pap smear not indicated.  H/O TLH with neg pathology.   Rx for Citalopram 20mg  daily to pharmacy.  #90/4RF Rx for Restoril 15mg  qhs prn insomnia.  #30/5RF Rx for Omeprazole 20mg  daily.  #90/4RF. TSH, Lipids, CMP, Vit D return annually or prn  An After Visit Summary was printed and given to the patient.

## 2013-07-09 NOTE — Patient Instructions (Signed)

## 2013-07-10 LAB — TSH: TSH: 1.022 u[IU]/mL (ref 0.350–4.500)

## 2013-07-10 LAB — VITAMIN D 25 HYDROXY (VIT D DEFICIENCY, FRACTURES): VIT D 25 HYDROXY: 33 ng/mL (ref 30–89)

## 2013-07-19 ENCOUNTER — Telehealth: Payer: Self-pay | Admitting: Emergency Medicine

## 2013-07-19 DIAGNOSIS — E049 Nontoxic goiter, unspecified: Secondary | ICD-10-CM

## 2013-07-19 NOTE — Telephone Encounter (Signed)
Message copied by Joeseph AmorFAST, Jozelyn Kuwahara L on Mon Jul 19, 2013  1:42 PM ------      Message from: Jerene BearsMILLER, MARY S      Created: Tue Jul 13, 2013 11:04 AM       Inform CMP  Is normal except there is one liver test that is barely elevated.  Has been higher in the past.  Will watch and repeat next year.  Lipids mildly elevated.  LDLs 144.  TG's normal.  HDLs 54.  Ratio ok.  Repeat one year.  TSH normal.  Vit D normal. ------

## 2013-07-19 NOTE — Telephone Encounter (Signed)
Message left to return call to [facility] at [phone].   Order placed.

## 2013-07-19 NOTE — Telephone Encounter (Signed)
Message copied by Joeseph AmorFAST, Shantell Belongia L on Mon Jul 19, 2013 10:31 AM ------      Message from: Jerene BearsMILLER, MARY S      Created: Thu Jul 15, 2013  4:52 PM      Regarding: thyroid u/s       Needs thyroid u/s scheduled please.            MSM ------

## 2013-07-19 NOTE — Telephone Encounter (Signed)
Spoke with patient. Message from Labs and Dr. Hyacinth MeekerMiller given.  Scheduled Thyroid US, patient aware. [address] at [facility] on 4/13 at 1015. Patient agreeable to time/date/location.  Imaging hold placed.  Routing to provider for final review. Patient agreeable to disposition. Will close encounter

## 2013-07-19 NOTE — Telephone Encounter (Signed)
Returning a call to Tracy °

## 2013-07-26 ENCOUNTER — Ambulatory Visit
Admission: RE | Admit: 2013-07-26 | Discharge: 2013-07-26 | Disposition: A | Discharge status: Home / Self Care | Payer: 59 | Source: Ambulatory Visit | Attending: Obstetrics & Gynecology | Admitting: Obstetrics & Gynecology

## 2013-07-26 DIAGNOSIS — E049 Nontoxic goiter, unspecified: Secondary | ICD-10-CM

## 2013-07-27 ENCOUNTER — Telehealth: Payer: Self-pay | Admitting: Emergency Medicine

## 2013-07-27 NOTE — Telephone Encounter (Signed)
Spoke with patient and message from Dr. Hyacinth MeekerMiller given. Patient verbalized understanding and will follow up next year.  Routing to provider for final review. Patient agreeable to disposition. Will close encounter

## 2013-07-27 NOTE — Telephone Encounter (Signed)
Message copied by Joeseph AmorFAST, Miriah Maruyama L on Tue Jul 27, 2013 11:51 AM ------      Message from: Jerene BearsMILLER, MARY S      Created: Tue Jul 27, 2013  6:49 AM       Please inform pt there is a small nodule <1cm in her thyroid.  This does not need to be biopsied.  Will repeat exam of thyroid yearly, as I already do,  Thanks. ------

## 2013-09-20 ENCOUNTER — Encounter: Payer: Self-pay | Admitting: Family Medicine

## 2013-09-20 ENCOUNTER — Ambulatory Visit (INDEPENDENT_AMBULATORY_CARE_PROVIDER_SITE_OTHER): Payer: 59 | Admitting: Family Medicine

## 2013-09-20 ENCOUNTER — Telehealth: Payer: Self-pay | Admitting: Family Medicine

## 2013-09-20 VITALS — BP 142/84 | HR 91 | Temp 98.4°F | Ht 66.0 in | Wt 239.5 lb

## 2013-09-20 DIAGNOSIS — K219 Gastro-esophageal reflux disease without esophagitis: Secondary | ICD-10-CM

## 2013-09-20 DIAGNOSIS — R7309 Other abnormal glucose: Secondary | ICD-10-CM

## 2013-09-20 DIAGNOSIS — J309 Allergic rhinitis, unspecified: Secondary | ICD-10-CM

## 2013-09-20 DIAGNOSIS — G5600 Carpal tunnel syndrome, unspecified upper limb: Secondary | ICD-10-CM

## 2013-09-20 DIAGNOSIS — E785 Hyperlipidemia, unspecified: Secondary | ICD-10-CM

## 2013-09-20 DIAGNOSIS — F419 Anxiety disorder, unspecified: Secondary | ICD-10-CM

## 2013-09-20 DIAGNOSIS — R739 Hyperglycemia, unspecified: Secondary | ICD-10-CM

## 2013-09-20 DIAGNOSIS — I10 Essential (primary) hypertension: Secondary | ICD-10-CM

## 2013-09-20 DIAGNOSIS — F411 Generalized anxiety disorder: Secondary | ICD-10-CM

## 2013-09-20 DIAGNOSIS — E669 Obesity, unspecified: Secondary | ICD-10-CM

## 2013-09-20 DIAGNOSIS — Z0000 Encounter for general adult medical examination without abnormal findings: Secondary | ICD-10-CM | POA: Insufficient documentation

## 2013-09-20 MED ORDER — SIMVASTATIN 20 MG PO TABS: ORAL_TABLET | ORAL | Status: DC

## 2013-09-20 MED ORDER — LISINOPRIL 10 MG PO TABS: ORAL_TABLET | ORAL | Status: DC

## 2013-09-20 NOTE — Progress Notes (Signed)
Pre visit review using our clinic review tool, if applicable. No additional management support is needed unless otherwise documented below in the visit note. 

## 2013-09-20 NOTE — Assessment & Plan Note (Signed)
Deteriorated. Advised OTC allegra D and flonase. Call or return to clinic prn if these symptoms worsen or fail to improve as anticipated. The patient indicates understanding of these issues and agrees with the plan.

## 2013-09-20 NOTE — Telephone Encounter (Signed)
Relevant patient education assigned to patient using Emmi. ° °

## 2013-09-20 NOTE — Assessment & Plan Note (Signed)
Deteriorated but has not been taking statin regularly. She does want to restart this. eRx sent.

## 2013-09-20 NOTE — Assessment & Plan Note (Signed)
Improved but not yet at goal. She is working on diet and exercise.

## 2013-09-20 NOTE — Assessment & Plan Note (Signed)
Discussed course and tx. Handout given from sports med advisor. Call or return to clinic prn if these symptoms worsen or fail to improve as anticipated. The patient indicates understanding of these issues and agrees with the plan.

## 2013-09-20 NOTE — Patient Instructions (Addendum)
Great to see you. Try to take your zocor in the morning to see if that helps. Keep me updated on your hand symptoms.

## 2013-09-20 NOTE — Progress Notes (Signed)
Subjective:   Patient ID: Brenda Obrien, female    DOB: Nov 12, 1965, 48 y.o.   MRN: [id number]  Brenda Obrien is a pleasant 48 y.o. year old female who presents to clinic today with Annual Exam and Numbness  on 09/20/2013  HPI: Originally here for CPX but just had routine exam by GYN 3 months ago. S/p Hysterectomy (h/o TLH- neg path). Saw GYN in 06/2013- Dr. Hyacinth Meeker Mammo 08/25/12 Tdap 2006  Right hand numbness- worse at night.  Sometimes notices it during the day when she is moving the yard.  No known injury.  No decreased grip strength.  US thyroid 07/27/13- 7 mm nodule, not meeting criteria for biopsy.  Follow up recommended in 1 year. Lab Results  Component Value Date   TSH 1.022 07/09/2013     S/p TKA in 12/2012 and Lap chole 5/14.  HLD- on Zocor 20 mg daily.  Denies myalgias but had been forgetting to take it for past several months consistently.  Sometimes forgets to take because taking it bedtime. Lab Results  Component Value Date   CHOL 218* 07/09/2013   HDL 54 07/09/2013   LDLCALC 144* 07/09/2013   LDLDIRECT 165.6 02/18/2011   TRIG 98 07/09/2013   CHOLHDL 4.0 07/09/2013   Lab Results  Component Value Date   ALT 39* 07/09/2013   AST 23 07/09/2013   ALKPHOS 103 07/09/2013   BILITOT 0.6 07/09/2013   HTN- improved.  Walking more since her knee replacement.  Takes her lunch to work. Wt Readings from Last 3 Encounters:  09/20/13 239 lb 8 oz (108.636 kg)  07/09/13 237 lb (107.502 kg)  10/02/12 243 lb (110.224 kg)    Anxiety/depression- Improved since she changed jobs.  On Lexapro 20 mg daily, as needed Ativan.  Still takes Restoril as needed at bed time.  Taking both them maybe once a week now.  Stopped taking Tramadol and flexeril.    Current Outpatient Prescriptions on File Prior to Visit  Medication Sig Dispense Refill  . escitalopram (LEXAPRO) 20 MG tablet TAKE 1 TABLET BY MOUTH EVERY DAY  90 tablet  4  . Multiple Vitamin (MULTIVITAMIN) tablet Take 1 tablet by mouth  daily.      Marland Kitchen omeprazole (PRILOSEC) 40 MG capsule Take 1 capsule (40 mg total) by mouth daily.  90 capsule  4  . temazepam (RESTORIL) 30 MG capsule Take 1 capsule (30 mg total) by mouth at bedtime as needed. For sleep  30 capsule  5  . TRANSDERM-SCOP 1.5 MG USE AS DIRECTED  4 patch  0   No current facility-administered medications on file prior to visit.    No Known Allergies  Past Medical History  Diagnosis Date  . GERD (gastroesophageal reflux disease)   . Asthma   . 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 Hypertension   . Elevated cholesterol   . Endometriosis   . Depression     Past Surgical History  Procedure Laterality Date  . Anterior cruciate ligament repair  1987,2001,2003,2005  . Laparoscopic total hysterectomy  6/11  . Left knee surgery  09/2011  . Replacement total knee Left 9/14    Dr. Elliot Dally  . Laparoscopic cholecystectomy  5/14    [place], [place]  . Nasal sinus surgery  1992    Family History  Problem Relation Age of Onset  . Coronary artery disease Mother     PAD  . Heart attack Mother 65  . Diabetes Mother   . Lung cancer Father   .  Heart attack Father 30  . Heart attack Brother 45  . Hepatitis Brother     Hep B  . Bipolar disorder Sister     History   Social History  . Marital Status: Single    Spouse Name: N/A    Number of Children: N/A  . Years of Education: N/A   Occupational History  . 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 Smoking status: Former Smoker    Quit date: 04/15/2004  . Smokeless tobacco: Never Used  . Alcohol Use: 7.2 oz/week    12 Cans of beer per week     Comment: Socially  . Drug Use: No  . Sexual Activity: Yes    Birth Control/ Protection: Surgical     Comment: hysterectomy   Other Topics Concern  . 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       Review of Systems    See HPI No blood in stool More loose stools since she had her gall bladder removed Denies anxiety or depression No SI or HI +rhinitis No sinus pressure or fevers Objective:    BP 142/84  Pulse 91  Temp(Src) 98.4 F (36.9 C) (Oral)  Ht 5\' 6"  (1.676 m)  Wt 239 lb 8 oz (108.636 kg)  BMI 38.67 kg/m2  SpO2 97%   Physical Exam  Nursing note and vitals reviewed. Constitutional: She appears well-developed and well-nourished.  HENT:  Head: Normocephalic.  Right Ear: Tympanic membrane normal.  Left Ear: Tympanic membrane normal.  Nose: Mucosal edema and rhinorrhea present. No sinus tenderness. Right sinus exhibits no maxillary sinus tenderness and no frontal sinus tenderness. Left sinus exhibits no maxillary sinus tenderness.  Cardiovascular: Normal rate, regular rhythm and normal pulses.   Pulmonary/Chest: Effort normal and breath sounds normal.  Musculoskeletal:       Arms: Psychiatric: She has a normal mood and affect. Her speech is normal. Judgment and thought content normal. Cognition and memory are normal.          Assessment & Plan:   Routine general medical examination at a health care facility  Anxiety  Dyslipidemia  GERD  Hypertension  Hyperglycemia No Follow-up on file.

## 2013-09-20 NOTE — Assessment & Plan Note (Signed)
Stable on current rx. No changes. 

## 2013-10-27 ENCOUNTER — Ambulatory Visit (INDEPENDENT_AMBULATORY_CARE_PROVIDER_SITE_OTHER): Payer: 59 | Admitting: Family Medicine

## 2013-10-27 ENCOUNTER — Encounter: Payer: Self-pay | Admitting: Family Medicine

## 2013-10-27 VITALS — BP 136/78 | HR 103 | Temp 98.2°F | Wt 240.5 lb

## 2013-10-27 DIAGNOSIS — L909 Atrophic disorder of skin, unspecified: Secondary | ICD-10-CM

## 2013-10-27 DIAGNOSIS — L918 Other hypertrophic disorders of the skin: Secondary | ICD-10-CM | POA: Insufficient documentation

## 2013-10-27 DIAGNOSIS — F419 Anxiety disorder, unspecified: Secondary | ICD-10-CM

## 2013-10-27 DIAGNOSIS — F411 Generalized anxiety disorder: Secondary | ICD-10-CM

## 2013-10-27 DIAGNOSIS — L919 Hypertrophic disorder of the skin, unspecified: Secondary | ICD-10-CM

## 2013-10-27 MED ORDER — LORAZEPAM 0.5 MG PO TABS: ORAL_TABLET | ORAL | Status: DC

## 2013-10-27 NOTE — Progress Notes (Signed)
Pre visit review using our clinic review tool, if applicable. No additional management support is needed unless otherwise documented below in the visit note. 

## 2013-10-27 NOTE — Assessment & Plan Note (Signed)
Deteriorated. Discussed tx options. Consider buspar and or as needed benzo.  Referred to psychiatry but no available new pt appointment until 6 weeks from now. Will add prn ativan, she is declining buspar for now. She is aware of sedation and addiction potential of benzos. Call or return to clinic prn if these symptoms worsen or fail to improve as anticipated.

## 2013-10-27 NOTE — Assessment & Plan Note (Signed)
Skin tags are snipped off using Betadine for cleansing and sterile iris scissors. Local anesthesia was not used. These pathognomonic lesions are not sent for pathology.  

## 2013-10-27 NOTE — Progress Notes (Signed)
S: The patient complains of symptomatic skin tags under her left axilla. These are irritated by clothing, jewelry and rubbing.  Also has been seeing her therapist.  Feels the lexapro is no longer working as well.  More anxious and tearful. Denies SI or HI.  Having more panic attacks.  Current Outpatient Prescriptions on File Prior to Visit  Medication Sig Dispense Refill  . escitalopram (LEXAPRO) 20 MG tablet TAKE 1 TABLET BY MOUTH EVERY DAY  90 tablet  4  . lisinopril (PRINIVIL,ZESTRIL) 10 MG tablet TAKE 1 TABLET (10 MG TOTAL) BY MOUTH DAILY.  30 tablet  3  . Multiple Vitamin (MULTIVITAMIN) tablet Take 1 tablet by mouth daily.      Marland Kitchen. omeprazole (PRILOSEC) 40 MG capsule Take 1 capsule (40 mg total) by mouth daily.  90 capsule  4  . simvastatin (ZOCOR) 20 MG tablet TAKE 1 TABLET BY MOUTH AT BEDTIME  30 tablet  3  . temazepam (RESTORIL) 30 MG capsule Take 1 capsule (30 mg total) by mouth at bedtime as needed. For sleep  30 capsule  5  . TRANSDERM-SCOP 1.5 MG USE AS DIRECTED  4 patch  0   No current facility-administered medications on file prior to visit.    No Known Allergies  Past Medical History  Diagnosis Date  . GERD (gastroesophageal reflux disease)   . Asthma   . 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 Hypertension   . Elevated cholesterol   . Endometriosis   . Depression     Past Surgical History  Procedure Laterality Date  . Anterior cruciate ligament repair  1987,2001,2003,2005  . Laparoscopic total hysterectomy  6/11  . Left knee surgery  09/2011  . Replacement total knee Left 9/14    Dr. Elliot Dallyavid Martin  . [place]  . Nasal sinus surgery  1992    Family History  Problem Relation Age of Onset  . Coronary artery disease Mother     PAD  . Heart attack Mother 7250  . Diabetes Mother   . Lung cancer Father   . Heart attack Father 30  . Heart attack Brother 45  . Hepatitis Brother     Hep B  . Bipolar disorder Sister     History    Social History  . Marital Status: Single    Spouse Name: N/A    Number of Children: N/A  . Years of Education: N/A   Occupational History  . 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 Smoking status: Former Smoker    Quit date: 04/15/2004  . Smokeless tobacco: Never Used  . Alcohol Use: 7.2 oz/week    12 Cans of beer per week     Comment: Socially  . Drug Use: No  . Sexual Activity: Yes    Birth Control/ Protection: Surgical     Comment: hysterectomy   Other Topics Concern  . 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  O: BP 136/78  Pulse 103  Temp(Src) 98.2 F (36.8 C) (Oral)  Wt 240 lb 8 oz (109.09 kg)  SpO2 95%   Patient appears well.  Several benign skin tags are noted under left axilla.  Tearful and moderately anxious but otherwise appropriate

## 2013-11-08 ENCOUNTER — Telehealth: Payer: Self-pay

## 2013-11-08 ENCOUNTER — Other Ambulatory Visit: Payer: Self-pay | Admitting: *Deleted

## 2013-11-08 MED ORDER — SIMVASTATIN 20 MG PO TABS: ORAL_TABLET | ORAL | Status: DC

## 2013-11-08 MED ORDER — OMEPRAZOLE 40 MG PO CPDR: 40.0000 mg | DELAYED_RELEASE_CAPSULE | Freq: Every day | ORAL | Status: DC

## 2013-11-08 MED ORDER — ESCITALOPRAM OXALATE 20 MG PO TABS: ORAL_TABLET | ORAL | Status: DC

## 2013-11-08 MED ORDER — LISINOPRIL 10 MG PO TABS: ORAL_TABLET | ORAL | Status: DC

## 2013-11-08 NOTE — Telephone Encounter (Signed)
The refill was for [facility]. The request is for a home delivery ([facility]). LMOM for pt to contact office to see if she requested the home delivery service

## 2013-11-08 NOTE — Telephone Encounter (Signed)
Pt is requesting a 90 day supply for home delivery.  Last AEX: 07/09/13 Last refill: 07/09/13 #90, 4 rf  Is this ok?

## 2013-11-08 NOTE — Telephone Encounter (Signed)
Why do they need a RF?  I did it in March.  Can you call pharmacy?

## 2013-11-09 NOTE — Telephone Encounter (Signed)
S/W Pt. Pt would like the omeprazole and Lexapro sent to [facility] for a home delivery. She states its cheaper for her to do it that way. Pt PCP filled all meds yesterday for home delivery.

## 2013-11-30 ENCOUNTER — Other Ambulatory Visit: Payer: Self-pay | Admitting: *Deleted

## 2013-11-30 MED ORDER — LORAZEPAM 0.5 MG PO TABS: ORAL_TABLET | ORAL | Status: DC

## 2013-12-07 MED ORDER — LORAZEPAM 0.5 MG PO TABS: ORAL_TABLET | ORAL | Status: DC

## 2013-12-07 NOTE — Addendum Note (Signed)
Addended by: Desmond Dike on: 12/07/2013 11:59 AM   Modules accepted: Orders

## 2014-01-01 ENCOUNTER — Other Ambulatory Visit: Payer: Self-pay | Admitting: Family Medicine

## 2014-02-12 IMAGING — MG MM DIGITAL SCREENING BILAT
3 series · 3 of 7 positions shown · non-contrast
Comparison: Previous exams.

CLINICAL DATA: Screening.

DIGITAL BILATERAL SCREENING MAMMOGRAM WITH CAD
DIGITAL BREAST TOMOSYNTHESIS
Digital breast tomosynthesis images are acquired in two
projections.  These images are reviewed in combination with the
digital mammogram, confirming the findings below.

[L MLO]
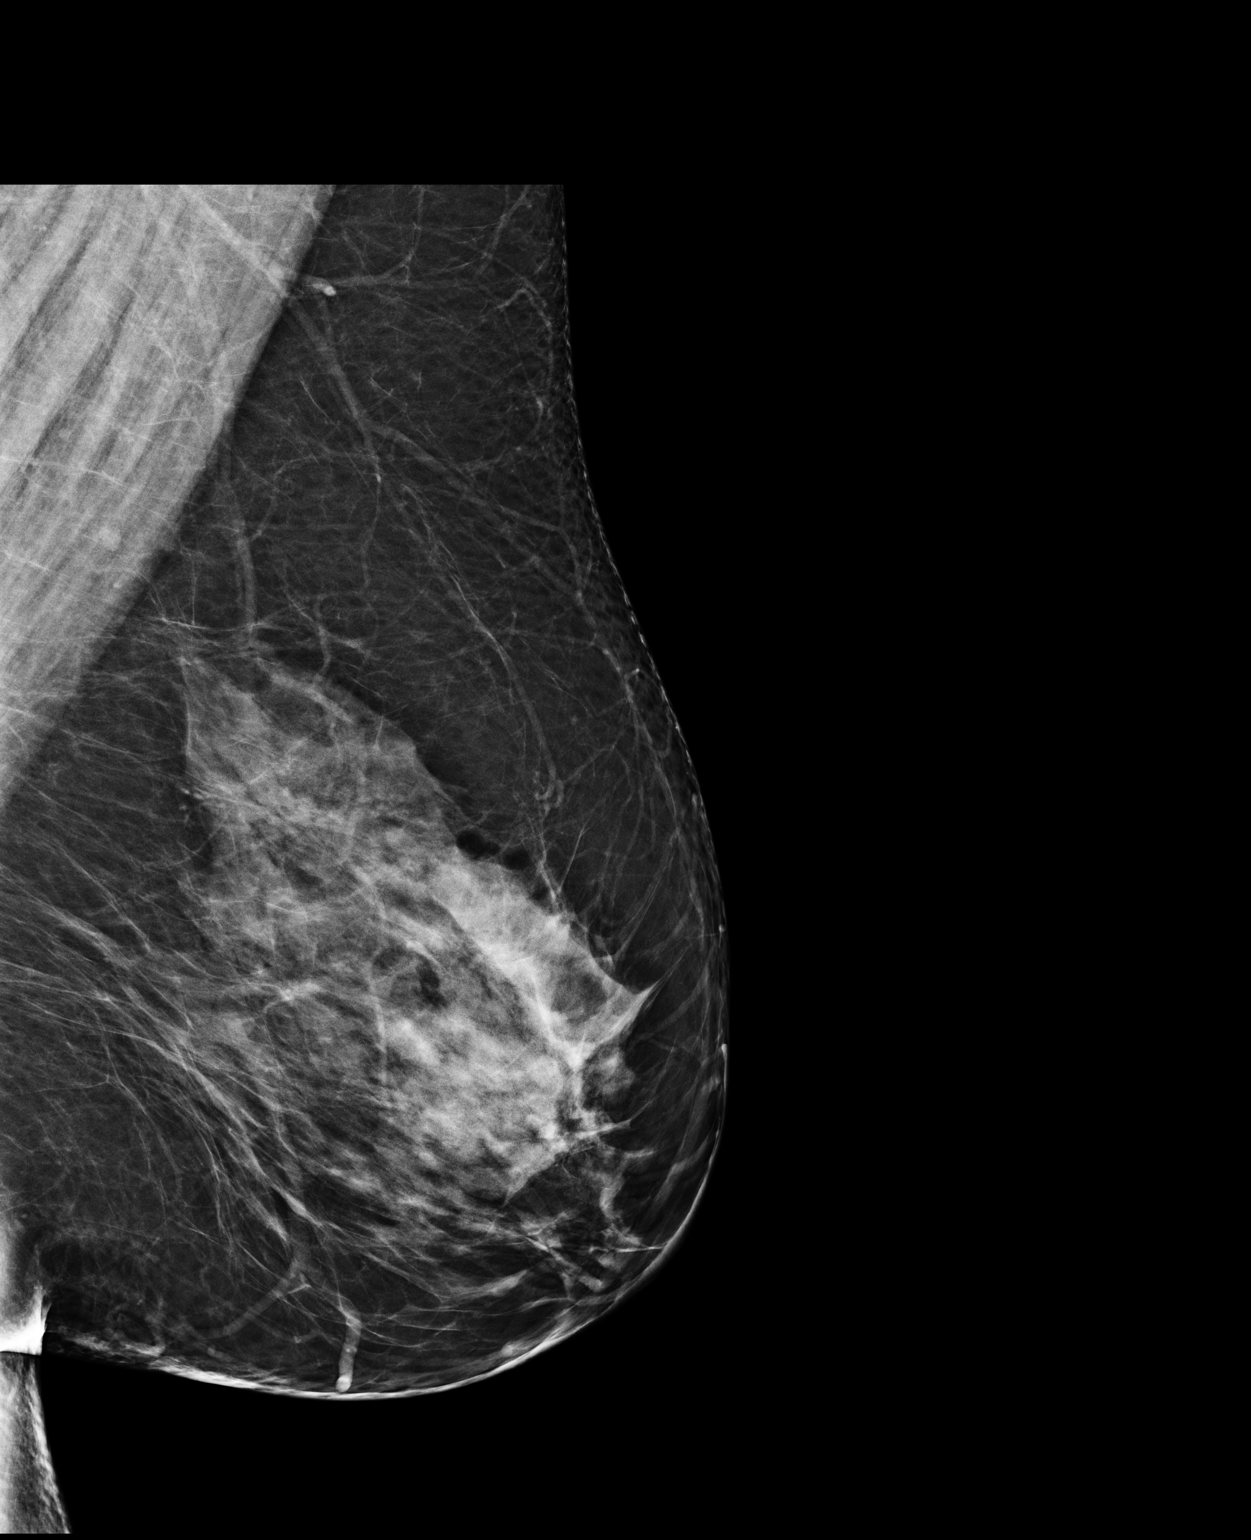

[R CC]
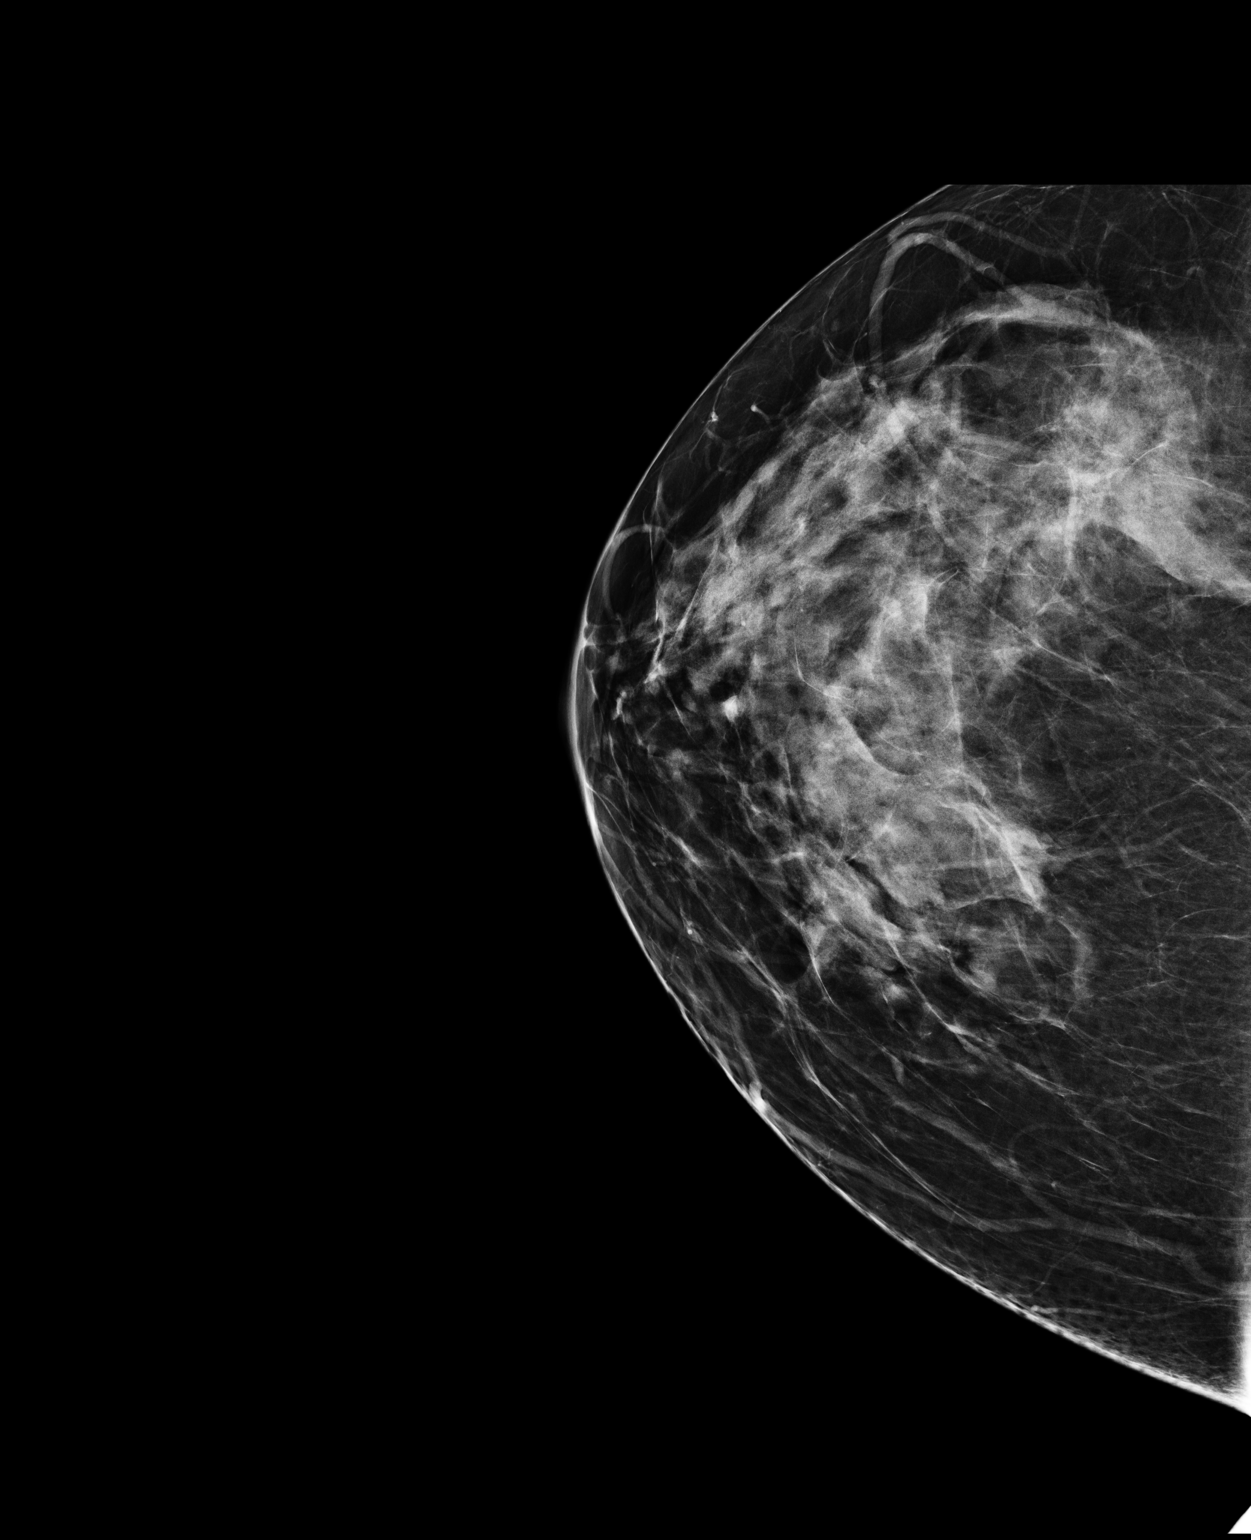

[L MLO tomo · tomo slice 51/100.0]
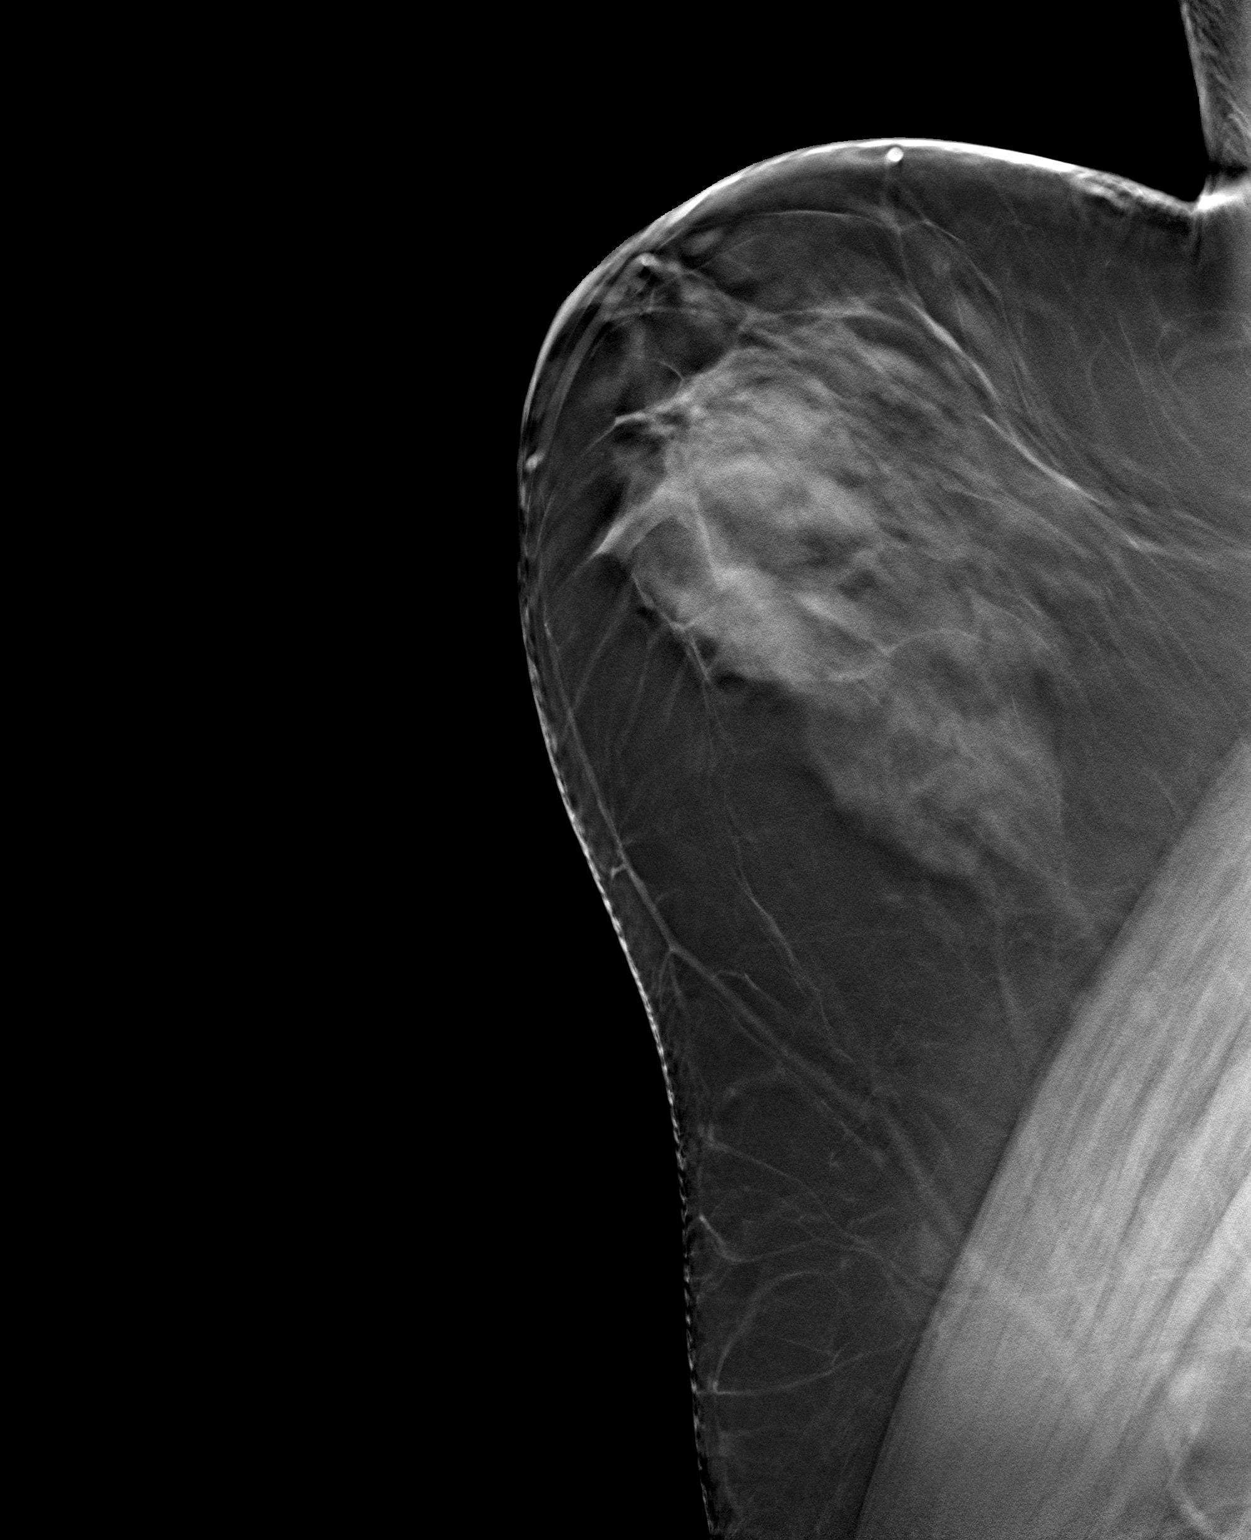

[3 of 7 positions shown; findings below may reference images not displayed]

FINDINGS: ACR Breast Density Category 3: The breast tissue is heterogeneously
dense.

No suspicious masses, architectural distortion, or calcifications
are present.

Images were processed with CAD.
IMPRESSION: No mammographic evidence of malignancy.

A result letter of this screening mammogram will be mailed directly
to the patient.

RECOMMENDATION:
Screening mammogram in one year. (Code:76-4-KYD)

BI-RADS CATEGORY 1:  Negative.

## 2014-04-15 HISTORY — PX: EYE SURGERY: SHX253

## 2014-04-28 DIAGNOSIS — S82009A Unspecified fracture of unspecified patella, initial encounter for closed fracture: Secondary | ICD-10-CM | POA: Insufficient documentation

## 2014-05-03 ENCOUNTER — Ambulatory Visit (INDEPENDENT_AMBULATORY_CARE_PROVIDER_SITE_OTHER): Payer: 59 | Admitting: Family Medicine

## 2014-05-03 ENCOUNTER — Encounter: Payer: Self-pay | Admitting: Family Medicine

## 2014-05-03 VITALS — BP 122/62 | HR 89 | Temp 98.1°F | Wt 242.5 lb

## 2014-05-03 DIAGNOSIS — R062 Wheezing: Secondary | ICD-10-CM

## 2014-05-03 MED ORDER — AMOXICILLIN-POT CLAVULANATE 875-125 MG PO TABS: 1.0000 | ORAL_TABLET | Freq: Two times a day (BID) | ORAL | Status: AC

## 2014-05-03 MED ORDER — FLUCONAZOLE 150 MG PO TABS: 150.0000 mg | ORAL_TABLET | Freq: Once | ORAL | Status: AC

## 2014-05-03 NOTE — Patient Instructions (Signed)
Good to see you. We are starting Augmentin - 1 tablet twice daily x 10 days. Please update me.

## 2014-05-03 NOTE — Assessment & Plan Note (Signed)
New- Given history, will place empirically on Augmentin for aspiration PNA. Call or return to clinic prn if these symptoms worsen or fail to improve as anticipated. The patient indicates understanding of these issues and agrees with the plan.

## 2014-05-03 NOTE — Progress Notes (Signed)
Subjective:   Patient ID: Brenda Obrien, female    DOB: 04/04/1966, 49 y.o.   MRN: [id number]  Brenda Obrien is a pleasant 49 y.o. year old female who presents to clinic today with Cough  on 05/03/2014  HPI: Yesterday, choked on one of her medications and she felt it "went down the wrong way."  A lot of burning in her chest that immediately was followed by wheezing. Productive cough started last night.  No hemoptysis or fevers. No SOB.  Current Outpatient Prescriptions on File Prior to Visit  Medication Sig Dispense Refill  . escitalopram (LEXAPRO) 20 MG tablet Take 1 tablet by mouth  every day 90 tablet 0  . lisinopril (PRINIVIL,ZESTRIL) 10 MG tablet Take 1 tablet by mouth  daily 90 tablet 0  . LORazepam (ATIVAN) 0.5 MG tablet TAKE 1TABLET BY MOUTH EVERY 8 HOURS AS NEEDED FOR ANXIETY 180 tablet 0  . Multiple Vitamin (MULTIVITAMIN) tablet Take 1 tablet by mouth daily.    Marland Kitchen. omeprazole (PRILOSEC) 40 MG capsule Take 1 capsule (40 mg total) by mouth daily. 90 capsule 2  . simvastatin (ZOCOR) 20 MG tablet Take 1 tablet by mouth at  bedtime 90 tablet 0  . temazepam (RESTORIL) 30 MG capsule Take 1 capsule (30 mg total) by mouth at bedtime as needed. For sleep 30 capsule 5  . TRANSDERM-SCOP 1.5 MG USE AS DIRECTED 4 patch 0   No current facility-administered medications on file prior to visit.    No Known Allergies  Past Medical History  Diagnosis Date  . GERD (gastroesophageal reflux disease)   . Asthma   . 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 Hypertension   . Elevated cholesterol   . Endometriosis   . Depression     Past Surgical History  Procedure Laterality Date  . Anterior cruciate ligament repair  1987,2001,2003,2005  . Laparoscopic total hysterectomy  6/11  . Left knee surgery  09/2011  . Replacement total knee Left 9/14    Dr. Elliot Dallyavid Martin  . [place]  [place]  . Nasal sinus surgery  1992    Family History  Problem Relation Age of Onset  .  Coronary artery disease Mother     PAD  . Heart attack Mother 6950  . Diabetes Mother   . Lung cancer Father   . Heart attack Father 30  . Heart attack Brother 45  . Hepatitis Brother     Hep B  . Bipolar disorder Sister     History   Social History  . Marital Status: Single    Spouse Name: N/A    Number of Children: N/A  . Years of Education: N/A   Occupational History  . 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 Smoking status: Former Smoker    Quit date: 04/15/2004  . Smokeless tobacco: Never Used  . Alcohol Use: 7.2 oz/week    12 Cans of beer per week     Comment: Socially  . Drug Use: No  . Sexual Activity: Yes    Birth Control/ Protection: Surgical     Comment: hysterectomy   Other Topics Concern  . 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   Review of Systems  Constitutional: Positive for fatigue. Negative for fever.  HENT: Negative.   Respiratory: Positive for cough and wheezing. Negative for shortness of breath and stridor.   Cardiovascular: Positive for chest pain.  Skin: Negative.   Neurological: Negative.   Hematological: Negative.   Psychiatric/Behavioral: Negative.   All other systems reviewed and are negative.      Objective:    BP 122/62 mmHg  Pulse 89  Temp(Src) 98.1 F (36.7 C) (Oral)  Wt 242 lb 8 oz (109.997 kg)  SpO2 97%   Physical Exam  Constitutional: She is oriented to person, place, and time. She appears well-developed and well-nourished. No distress.  HENT:  Head: Normocephalic.  Eyes: Conjunctivae are normal.  Neck: Normal range of motion.  Cardiovascular: Normal rate and regular rhythm.   Pulmonary/Chest: Effort normal. No accessory muscle usage. No  respiratory distress. She has wheezes in the left middle field. She has no rhonchi. She has no rales.  Abdominal: Soft.  Musculoskeletal: Normal range of motion.  Neurological: She is alert and oriented to person, place, and time. No cranial nerve deficit.  Skin: Skin is warm and dry.  Psychiatric: She has a normal mood and affect. Her behavior is normal. Judgment and thought content normal.  Nursing note and vitals reviewed.         Assessment & Plan:   Wheezing No Follow-up on file.

## 2014-05-03 NOTE — Progress Notes (Signed)
Pre visit review using our clinic review tool, if applicable. No additional management support is needed unless otherwise documented below in the visit note. 

## 2014-05-04 ENCOUNTER — Other Ambulatory Visit: Payer: Self-pay | Admitting: Family Medicine

## 2014-05-11 ENCOUNTER — Other Ambulatory Visit: Payer: Self-pay | Admitting: *Deleted

## 2014-05-11 MED ORDER — LORAZEPAM 0.5 MG PO TABS: ORAL_TABLET | ORAL | Status: DC

## 2014-05-11 NOTE — Telephone Encounter (Signed)
Pt's last cpx was in 6/15, and she has a cpx scheduled in 6/16.

## 2014-06-27 ENCOUNTER — Other Ambulatory Visit: Payer: Self-pay | Admitting: Family Medicine

## 2014-07-06 ENCOUNTER — Other Ambulatory Visit: Payer: Self-pay | Admitting: Family Medicine

## 2014-07-15 ENCOUNTER — Ambulatory Visit: Payer: 59 | Admitting: Obstetrics & Gynecology

## 2014-09-15 ENCOUNTER — Other Ambulatory Visit: Payer: Self-pay | Admitting: Family Medicine

## 2014-09-15 DIAGNOSIS — E785 Hyperlipidemia, unspecified: Secondary | ICD-10-CM

## 2014-09-15 DIAGNOSIS — Z0000 Encounter for general adult medical examination without abnormal findings: Secondary | ICD-10-CM

## 2014-09-22 ENCOUNTER — Encounter: Payer: 59 | Admitting: Family Medicine

## 2014-09-23 ENCOUNTER — Other Ambulatory Visit: Payer: 59

## 2014-09-28 ENCOUNTER — Ambulatory Visit (INDEPENDENT_AMBULATORY_CARE_PROVIDER_SITE_OTHER): Payer: 59 | Admitting: Family Medicine

## 2014-09-28 ENCOUNTER — Encounter: Payer: Self-pay | Admitting: Family Medicine

## 2014-09-28 VITALS — BP 132/84 | HR 88 | Temp 98.0°F | Ht 66.0 in | Wt 236.0 lb

## 2014-09-28 DIAGNOSIS — K219 Gastro-esophageal reflux disease without esophagitis: Secondary | ICD-10-CM

## 2014-09-28 DIAGNOSIS — Z0000 Encounter for general adult medical examination without abnormal findings: Secondary | ICD-10-CM

## 2014-09-28 DIAGNOSIS — R739 Hyperglycemia, unspecified: Secondary | ICD-10-CM | POA: Diagnosis not present

## 2014-09-28 DIAGNOSIS — F419 Anxiety disorder, unspecified: Secondary | ICD-10-CM | POA: Diagnosis not present

## 2014-09-28 DIAGNOSIS — I10 Essential (primary) hypertension: Secondary | ICD-10-CM

## 2014-09-28 DIAGNOSIS — E785 Hyperlipidemia, unspecified: Secondary | ICD-10-CM | POA: Diagnosis not present

## 2014-09-28 DIAGNOSIS — Z01419 Encounter for gynecological examination (general) (routine) without abnormal findings: Secondary | ICD-10-CM | POA: Insufficient documentation

## 2014-09-28 LAB — LIPID PANEL
CHOLESTEROL: 222 mg/dL — AB (ref 0–200)
HDL: 58.3 mg/dL (ref >39.00)
LDL Cholesterol: 139 mg/dL — ABNORMAL HIGH (ref 0–99)
NONHDL: 163.7
Total CHOL/HDL Ratio: 4
Triglycerides: 126 mg/dL (ref 0.0–149.0)
VLDL: 25.2 mg/dL (ref 0.0–40.0)

## 2014-09-28 LAB — COMPREHENSIVE METABOLIC PANEL
ALT: 72 U/L — AB (ref 0–35)
AST: 41 U/L — AB (ref 0–37)
Albumin: 4.6 g/dL (ref 3.5–5.2)
Alkaline Phosphatase: 88 U/L (ref 39–117)
BUN: 12 mg/dL (ref 6–23)
CHLORIDE: 104 meq/L (ref 96–112)
CO2: 29 meq/L (ref 19–32)
CREATININE: 0.82 mg/dL (ref 0.40–1.20)
Calcium: 9.8 mg/dL (ref 8.4–10.5)
GFR: 79.24 mL/min (ref >60.00)
Glucose, Bld: 104 mg/dL — ABNORMAL HIGH (ref 70–99)
Potassium: 4.9 mEq/L (ref 3.5–5.1)
SODIUM: 139 meq/L (ref 135–145)
Total Bilirubin: 0.5 mg/dL (ref 0.2–1.2)
Total Protein: 7.4 g/dL (ref 6.0–8.3)

## 2014-09-28 LAB — CBC WITH DIFFERENTIAL/PLATELET
BASOS ABS: 0 10*3/uL (ref 0.0–0.1)
Basophils Relative: 0.6 % (ref 0.0–3.0)
EOS PCT: 2.5 % (ref 0.0–5.0)
Eosinophils Absolute: 0.2 10*3/uL (ref 0.0–0.7)
HEMATOCRIT: 45.5 % (ref 36.0–46.0)
Hemoglobin: 14.9 g/dL (ref 12.0–15.0)
Lymphocytes Relative: 26.6 % (ref 12.0–46.0)
Lymphs Abs: 1.8 10*3/uL (ref 0.7–4.0)
MCHC: 32.8 g/dL (ref 30.0–36.0)
MCV: 94.8 fl (ref 78.0–100.0)
MONOS PCT: 8.4 % (ref 3.0–12.0)
Monocytes Absolute: 0.6 10*3/uL (ref 0.1–1.0)
NEUTROS ABS: 4.2 10*3/uL (ref 1.4–7.7)
Neutrophils Relative %: 61.9 % (ref 43.0–77.0)
Platelets: 207 10*3/uL (ref 150.0–400.0)
RBC: 4.79 Mil/uL (ref 3.87–5.11)
RDW: 14.2 % (ref 11.5–15.5)
WBC: 6.8 10*3/uL (ref 4.0–10.5)

## 2014-09-28 LAB — VITAMIN B12: VITAMIN B 12: 134 pg/mL — AB (ref 211–911)

## 2014-09-28 LAB — VITAMIN D 25 HYDROXY (VIT D DEFICIENCY, FRACTURES): VITD: 16.54 ng/mL — ABNORMAL LOW (ref 30.00–100.00)

## 2014-09-28 LAB — TSH: TSH: 1.4 u[IU]/mL (ref 0.35–4.50)

## 2014-09-28 MED ORDER — SERTRALINE HCL 50 MG PO TABS: 50.0000 mg | ORAL_TABLET | Freq: Every day | ORAL | Status: DC

## 2014-09-28 MED ORDER — SIMVASTATIN 20 MG PO TABS: ORAL_TABLET | ORAL | Status: DC

## 2014-09-28 MED ORDER — BUPROPION HCL ER (XL) 150 MG PO TB24: 150.0000 mg | ORAL_TABLET | Freq: Every day | ORAL | Status: DC

## 2014-09-28 NOTE — Progress Notes (Signed)
Pre visit review using our clinic review tool, if applicable. No additional management support is needed unless otherwise documented below in the visit note. 

## 2014-09-28 NOTE — Assessment & Plan Note (Signed)
Well controlled on current rxs. No changes made today. 

## 2014-09-28 NOTE — Assessment & Plan Note (Signed)
Well controlled on current rxs.  Agreed to manage her rxs- eRx sent.

## 2014-09-28 NOTE — Patient Instructions (Signed)
Great to see you.  We will call you with your lab results. 

## 2014-09-28 NOTE — Assessment & Plan Note (Signed)
Reviewed preventive care protocols, scheduled due services, and updated immunizations Discussed nutrition, exercise, diet, and healthy lifestyle.  

## 2014-09-28 NOTE — Progress Notes (Signed)
Subjective:   Patient ID: JAMEA LAT, female    DOB: November 11, 1965, 49 y.o.   MRN: [id number]  Brenda Obrien is a pleasant 48 y.o. year old female who presents to clinic today with Annual Exam  on 09/28/2014  HPI:  Has GYN- sees Dr. Leda Quail. Remote h/o hysterectomy.  Mammogram UTD per pt.  HTN-  Has been well controlled on lisinopril 10 mg daily. Denies HA, blurred, CP or SOB. No LE edema.  Anxiety- has been seeing a psychiatrist but he is no longer in her network.  She wants me to manage this now.  Taking Zoloft 50 mg daily, Wellbutrin 150 mg daily and as needed Ativan- rarely uses.  Denies any current symptoms of anxiety or depression.  HLD- taking zocor 20 mg daily. Denies myalgias. Due for labs.   Still having problems with her knees.  Being seen by ortho at [facility].  Lab Results  Component Value Date   CHOL 218* 07/09/2013   HDL 54 07/09/2013   LDLCALC 144* 07/09/2013   LDLDIRECT 165.6 02/18/2011   TRIG 98 07/09/2013   CHOLHDL 4.0 07/09/2013   Lab Results  Component Value Date   CREATININE 0.79 07/09/2013   Lab Results  Component Value Date   TSH 1.022 07/09/2013   Lab Results  Component Value Date   WBC 14.5* 04/06/2012   HGB 13.8 04/06/2012   HCT 43.4 04/06/2012   MCV 90.6 04/06/2012   PLT 246 04/06/2012   Current Outpatient Prescriptions on File Prior to Visit  Medication Sig Dispense Refill  . lisinopril (PRINIVIL,ZESTRIL) 10 MG tablet Take 1 tablet by mouth  daily 90 tablet 3  . LORazepam (ATIVAN) 0.5 MG tablet TAKE 1TABLET BY MOUTH EVERY 8 HOURS AS NEEDED FOR ANXIETY 180 tablet 0  . Multiple Vitamin (MULTIVITAMIN) tablet Take 1 tablet by mouth daily.    Marland Kitchen omeprazole (PRILOSEC) 40 MG capsule Take 1 capsule by mouth  daily 90 capsule 2  . TRANSDERM-SCOP 1.5 MG USE AS DIRECTED 4 patch 0   No current facility-administered medications on file prior to visit.    No Known Allergies  Past Medical History  Diagnosis Date  . GERD  (gastroesophageal reflux disease)   . Asthma   . 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 Hypertension   . Elevated cholesterol   . Endometriosis   . Depression     Past Surgical History  Procedure Laterality Date  . Anterior cruciate ligament repair  1987,2001,2003,2005  . Laparoscopic total hysterectomy  6/11  . Left knee surgery  09/2011  . Replacement total knee Left 9/14    Dr. Elliot Dally  . Laparoscopic cholecystectomy  5/14    [place], [place]  . Nasal sinus surgery  1992    Family History  Problem Relation Age of Onset  . Coronary artery disease Mother     PAD  . Heart attack Mother 69  . Diabetes Mother   . Lung cancer Father   . Heart attack Father 30  . Heart attack Brother 45  . Hepatitis Brother     Hep B  . Bipolar disorder Sister     History   Social History  . Marital Status: Single    Spouse Name: N/A  . Number of Children: N/A  . Years of Education: N/A   Occupational History  . 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 Smoking status: Former Smoker    Quit date: 04/15/2004  .  Smokeless tobacco: Never Used  . Alcohol Use: 7.2 oz/week    12 Cans of beer per week     Comment: Socially  . Drug Use: No  . Sexual Activity: Yes    Birth Control/ Protection: Surgical     Comment: hysterectomy   Other Topics Concern  . Not on file   Social History Narrative   Single      No regular exercise      Works as Conservator, museum/gallery for [employer]         The PMH, PSH, Social History, Family History, Medications, and allergies have been reviewed in [facility], and have been updated if relevant.    Review of Systems  Constitutional: Negative.   HENT: Negative.   Respiratory: Negative.   Cardiovascular: Negative.   Gastrointestinal: Negative.   Endocrine: Negative.   Genitourinary: Negative.   Musculoskeletal: Positive for arthralgias.  Skin: Negative.   Allergic/Immunologic: Negative.   Neurological: Negative.     Hematological: Negative.   Psychiatric/Behavioral: Negative.   All other systems reviewed and are negative.      Objective:    BP 132/84 mmHg  Pulse 88  Temp(Src) 98 F (36.7 C) (Oral)  Ht  (1.676 m)  Wt 236 lb (107.049 kg)  BMI 38.11 kg/m2  SpO2 97%   Physical Exam     General:  Well-developed,well-nourished,in no acute distress; alert,appropriate and cooperative throughout examination Head:  normocephalic and atraumatic.   Eyes:  vision grossly intact, pupils equal, pupils round, and pupils reactive to light.   Ears:  R ear normal and L ear normal.   Nose:  no external deformity.   Mouth:  good dentition.   Neck:  No deformities, masses, or tenderness noted. Breasts:  No mass, nodules, thickening, tenderness, bulging, retraction, inflamation, nipple discharge or skin changes noted.   Lungs:  Normal respiratory effort, chest expands symmetrically. Lungs are clear to auscultation, no crackles or wheezes. Heart:  Normal rate and regular rhythm. S1 and S2 normal without gallop, murmur, click, rub or other extra sounds. Abdomen:  Bowel sounds positive,abdomen soft and non-tender without masses, organomegaly or hernias noted. Msk:  No deformity or scoliosis noted of thoracic or lumbar spine.   Extremities:  No clubbing, cyanosis, edema, or deformity noted with normal full range of motion of all joints.   Neurologic:  alert & oriented X3 and gait normal.   Skin:  Intact without suspicious lesions or rashes Cervical Nodes:  No lymphadenopathy noted Axillary Nodes:  No palpable lymphadenopathy Psych:  Cognition and judgment appear intact. Alert and cooperative with normal attention span and concentration. No apparent delusions, illusions, hallucinations  Assessment & Plan:   Anxiety  Dyslipidemia  Gastroesophageal reflux disease, esophagitis presence not specified  Hyperglycemia  Essential hypertension  Well woman exam No Follow-up on file.

## 2014-09-28 NOTE — Assessment & Plan Note (Signed)
Continue current rx. Check labs today. 

## 2014-09-28 NOTE — Addendum Note (Signed)
Addended by: Alvina Chou on: 09/28/2014 11:41 AM   Modules accepted: Orders

## 2014-10-04 ENCOUNTER — Telehealth: Payer: Self-pay | Admitting: Family Medicine

## 2014-10-04 NOTE — Telephone Encounter (Signed)
Pt returned call re: labs, please call after 10 am 10/04/14  Best number to call pt is [phone].

## 2014-10-05 ENCOUNTER — Encounter: Payer: Self-pay | Admitting: Family Medicine

## 2014-10-05 MED ORDER — VITAMIN D (ERGOCALCIFEROL) 1.25 MG (50000 UNIT) PO CAPS: 50000.0000 [IU] | ORAL_CAPSULE | ORAL | Status: DC

## 2014-10-05 NOTE — Telephone Encounter (Signed)
See results and mychart message

## 2014-11-02 ENCOUNTER — Other Ambulatory Visit: Payer: Self-pay | Admitting: Family Medicine

## 2014-11-02 NOTE — Telephone Encounter (Signed)
Pt has not had repeat labs. Is she needing to continue

## 2014-12-05 ENCOUNTER — Other Ambulatory Visit: Payer: Self-pay | Admitting: Family Medicine

## 2014-12-05 NOTE — Telephone Encounter (Signed)
Is pt needing to continue high dose? 

## 2015-01-09 ENCOUNTER — Other Ambulatory Visit: Payer: Self-pay | Admitting: Family Medicine

## 2015-01-09 DIAGNOSIS — E559 Vitamin D deficiency, unspecified: Secondary | ICD-10-CM

## 2015-01-09 NOTE — Telephone Encounter (Signed)
Call pt- Was supposed to have Vit D rechecked in 11/2014 Please order future Vit D lab Need lab before medication is refilled

## 2015-01-09 NOTE — Telephone Encounter (Signed)
Is pt needing to continue high dose? 

## 2015-01-09 NOTE — Telephone Encounter (Signed)
Spoke to pt and advised. Repeat vit D scheduled.

## 2015-01-09 NOTE — Addendum Note (Signed)
Addended by: Desmond Dike on: 01/09/2015 01:33 PM   Modules accepted: Orders

## 2015-01-09 NOTE — Telephone Encounter (Signed)
Labs ordered.

## 2015-01-14 IMAGING — US US SOFT TISSUE HEAD/NECK
1 series · 14 of 25 positions shown · non-contrast
Comparison: None.

CLINICAL DATA: Enlarged thyroid gland on physical examination.

EXAM:
THYROID ULTRASOUND
TECHNIQUE: Ultrasound examination of the thyroid gland and adjacent soft
tissues was performed.

[Series 1: us soft tissue head/neck · 0.06mm/px · 14 of 53 slices shown]
[im 1/53]
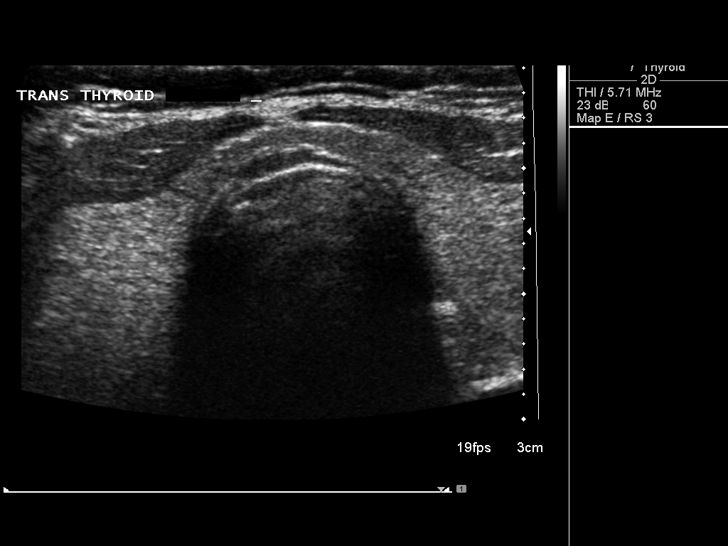
[im 5/53]
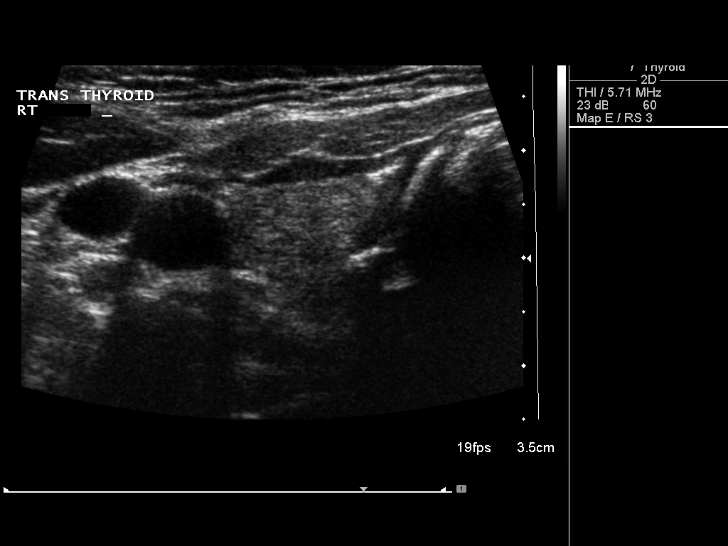
[im 9/53]
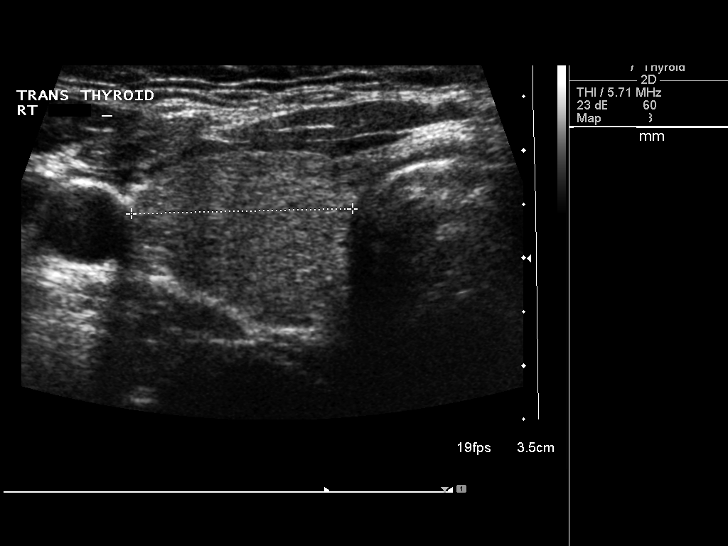
[im 14/53]
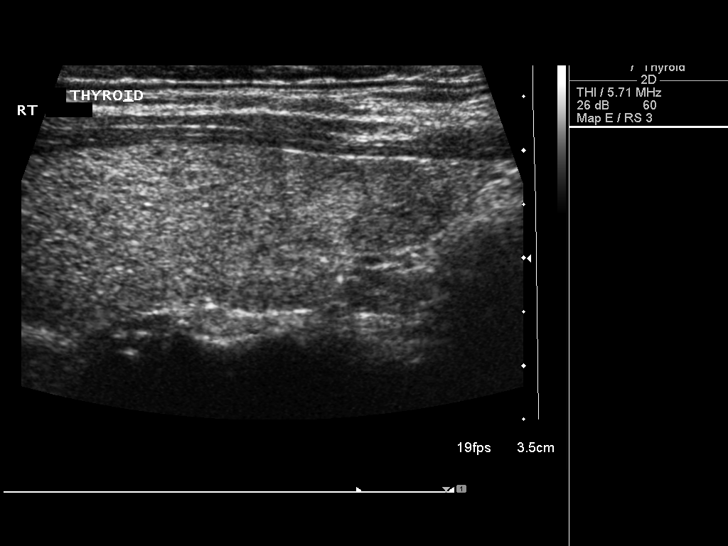
[im 18/53]
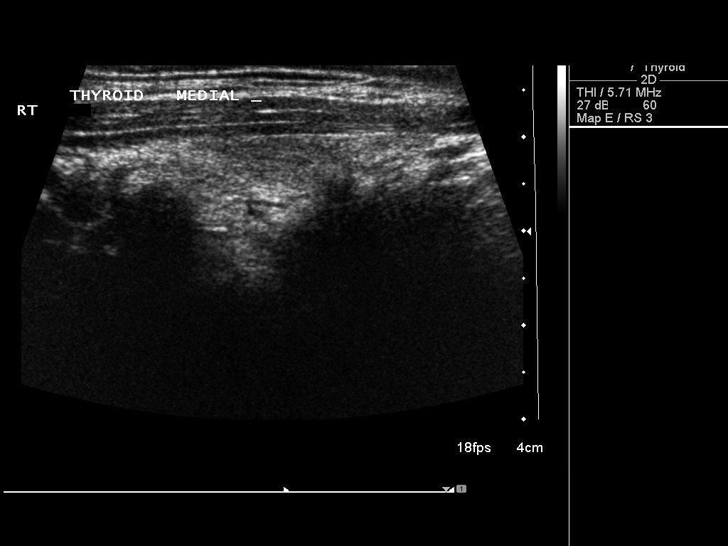
[im 20/53]
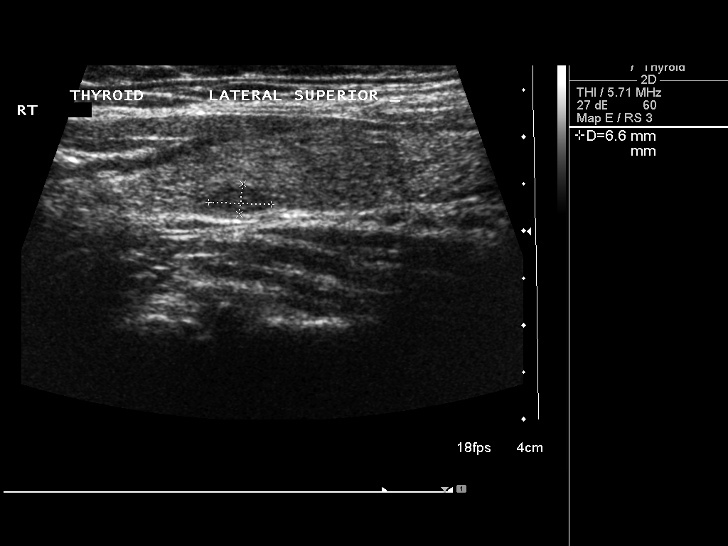
[im 24/53]
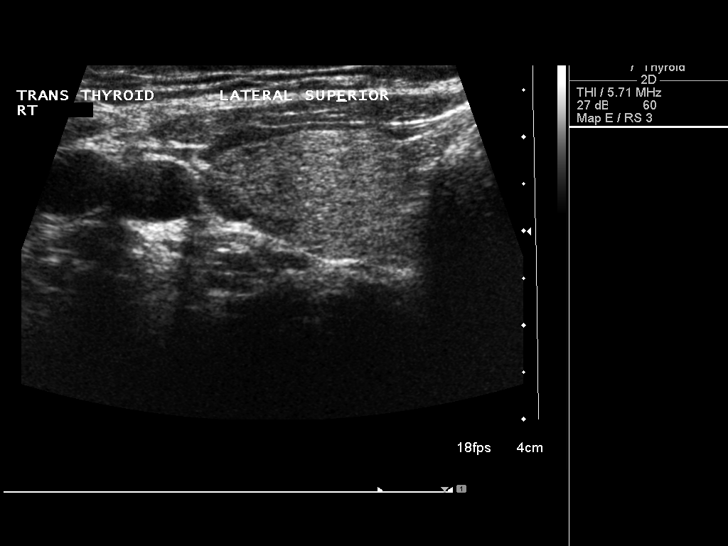
[im 29/53]
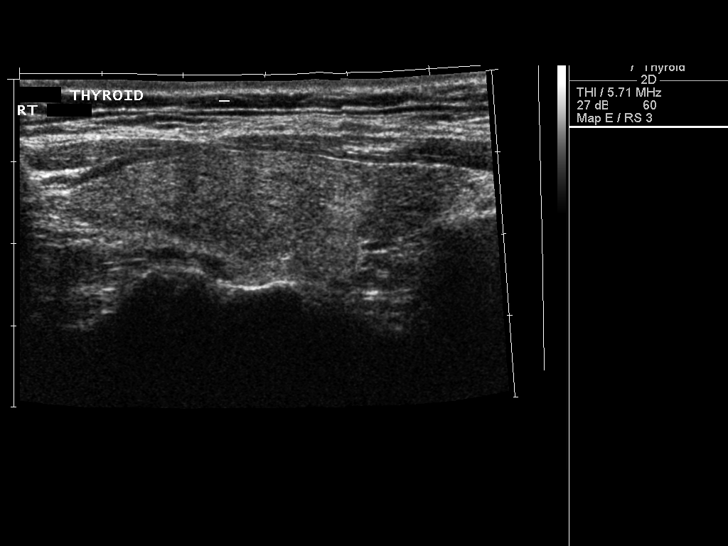
[im 33/53]
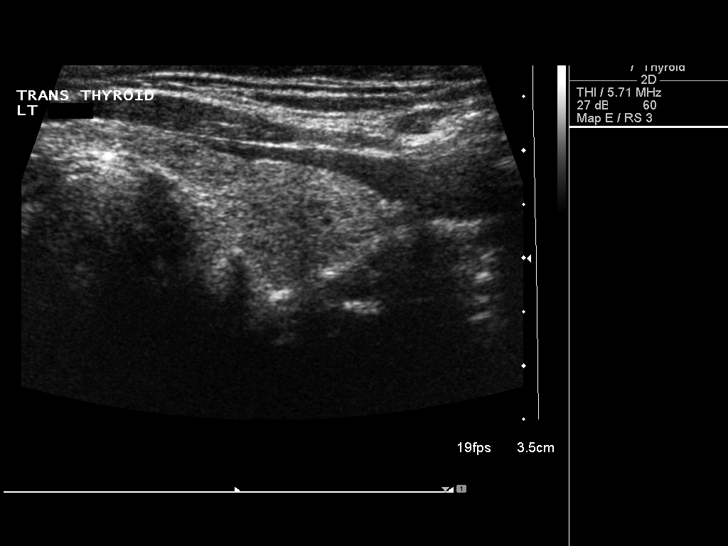
[im 35/53]
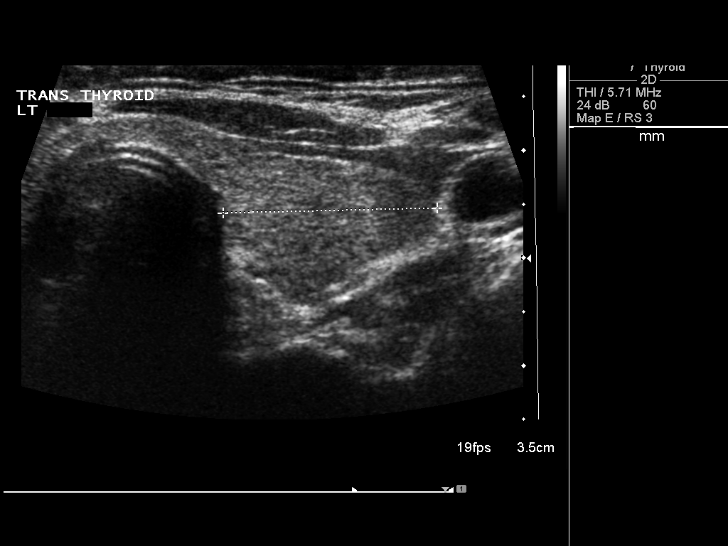
[im 40/53]
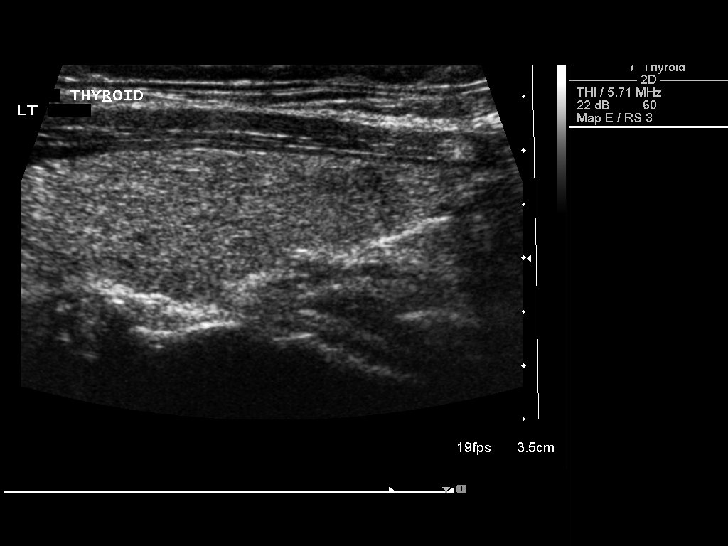
[im 44/53]
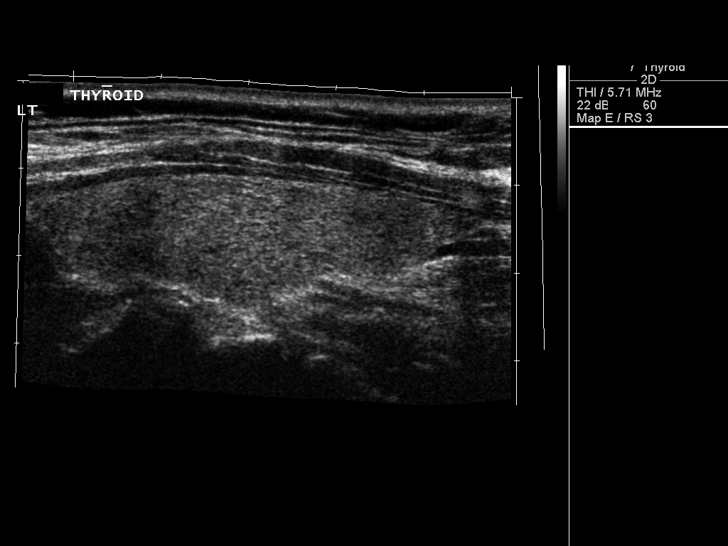
[im 48/53]
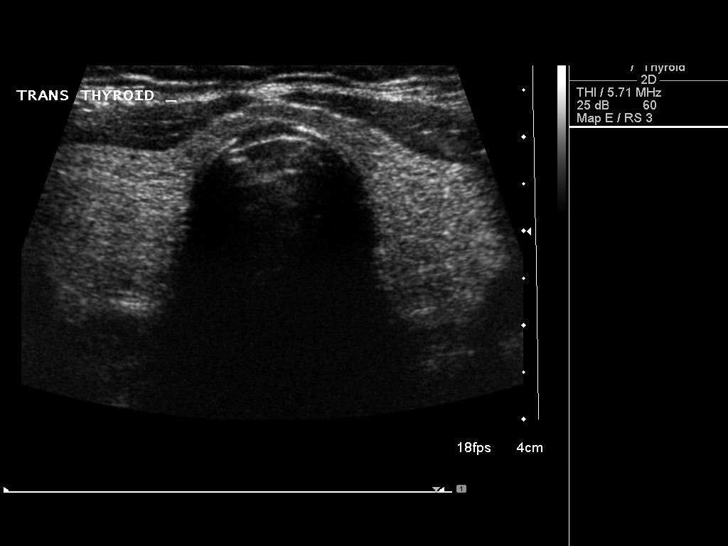
[im 53/53]
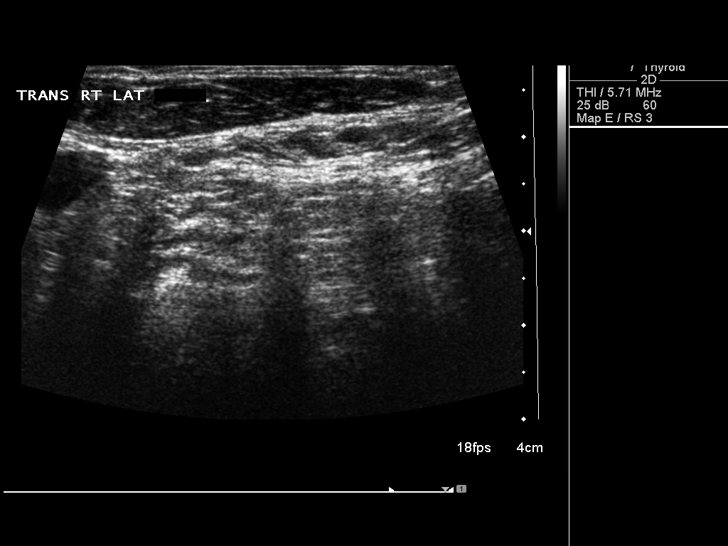

[14 of 25 positions shown; findings below may reference images not displayed]

FINDINGS: Right thyroid lobe

Measurements: 5.0 x 2.1 x 1.6 cm. 7 x 5 x 3 mm solid nodule in the
upper pole.

Left thyroid lobe

Measurements: 5.2 x 2.0 x 1.6 cm.  No nodules visualized.

Isthmus

Thickness: 1.9 mm.  No nodules visualized.

Lymphadenopathy

None visualized.
IMPRESSION: Solitary 7 mm nodule in the upper pole of the right lobe of the
thyroid gland. Otherwise, unremarkable examination.

## 2015-02-01 ENCOUNTER — Other Ambulatory Visit (INDEPENDENT_AMBULATORY_CARE_PROVIDER_SITE_OTHER): Payer: 59

## 2015-02-01 DIAGNOSIS — E559 Vitamin D deficiency, unspecified: Secondary | ICD-10-CM | POA: Diagnosis not present

## 2015-02-01 DIAGNOSIS — Z23 Encounter for immunization: Secondary | ICD-10-CM

## 2015-02-01 LAB — VITAMIN D 25 HYDROXY (VIT D DEFICIENCY, FRACTURES): VITD: 39.59 ng/mL (ref 30.00–100.00)

## 2015-04-03 ENCOUNTER — Ambulatory Visit: Payer: 59 | Admitting: Family Medicine

## 2015-04-16 DIAGNOSIS — F411 Generalized anxiety disorder: Secondary | ICD-10-CM

## 2015-04-16 DIAGNOSIS — F4310 Post-traumatic stress disorder, unspecified: Secondary | ICD-10-CM

## 2015-04-16 HISTORY — DX: Post-traumatic stress disorder, unspecified: F43.10

## 2015-04-16 HISTORY — DX: Generalized anxiety disorder: F41.1

## 2015-04-18 ENCOUNTER — Other Ambulatory Visit: Payer: Self-pay | Admitting: Family Medicine

## 2015-04-19 ENCOUNTER — Encounter: Payer: Self-pay | Admitting: Family Medicine

## 2015-04-19 ENCOUNTER — Ambulatory Visit (INDEPENDENT_AMBULATORY_CARE_PROVIDER_SITE_OTHER): Payer: 59 | Admitting: Family Medicine

## 2015-04-19 VITALS — BP 140/88 | HR 65 | Temp 99.0°F | Ht 66.0 in | Wt 224.0 lb

## 2015-04-19 DIAGNOSIS — J209 Acute bronchitis, unspecified: Secondary | ICD-10-CM | POA: Diagnosis not present

## 2015-04-19 MED ORDER — AZITHROMYCIN 250 MG PO TABS: ORAL_TABLET | ORAL | Status: DC

## 2015-04-19 MED ORDER — HYDROCODONE-HOMATROPINE 5-1.5 MG/5ML PO SYRP: 5.0000 mL | ORAL_SOLUTION | ORAL | Status: DC | PRN

## 2015-04-19 NOTE — Progress Notes (Signed)
   Subjective:    Patient ID: Brenda Obrien, female    DOB: 03/23/1966, 50 y.o.   MRN: [id number]  HPI Here for 5 days of fever to 100 degrees, ST, PND, chest tightness and coughing up green sputum. On Mucinex and Theraflu.    Review of Systems  Constitutional: Positive for fever.  HENT: Positive for congestion, postnasal drip and sore throat. Negative for ear pain and sinus pressure.   Eyes: Negative.  Negative for photophobia, pain, discharge, redness and visual disturbance.  Respiratory: Positive for cough and chest tightness. Negative for apnea, choking, shortness of breath, wheezing and stridor.   Neurological: Positive for headaches.       Objective:   Physical Exam  Constitutional: She appears well-developed and well-nourished.  HENT:  Right Ear: External ear normal.  Left Ear: External ear normal.  Nose: Nose normal.  Mouth/Throat: Oropharynx is clear and moist.  Eyes: Conjunctivae are normal.  Neck: No thyromegaly present.  Pulmonary/Chest: Effort normal. No respiratory distress. She has no wheezes. She has no rales.  Scattered rhonchi   Lymphadenopathy:    She has no cervical adenopathy.          Assessment & Plan:  Bronchitis, treat with a Zpack

## 2015-04-19 NOTE — Progress Notes (Signed)
Pre visit review using our clinic review tool, if applicable. No additional management support is needed unless otherwise documented below in the visit note. 

## 2015-05-22 ENCOUNTER — Ambulatory Visit (INDEPENDENT_AMBULATORY_CARE_PROVIDER_SITE_OTHER): Payer: 59 | Admitting: Obstetrics & Gynecology

## 2015-05-22 ENCOUNTER — Encounter: Payer: Self-pay | Admitting: Obstetrics & Gynecology

## 2015-05-22 VITALS — BP 140/80 | HR 88 | Resp 16 | Ht 66.25 in | Wt 222.0 lb

## 2015-05-22 DIAGNOSIS — Z01419 Encounter for gynecological examination (general) (routine) without abnormal findings: Secondary | ICD-10-CM

## 2015-05-22 DIAGNOSIS — Z0000 Encounter for general adult medical examination without abnormal findings: Secondary | ICD-10-CM | POA: Diagnosis not present

## 2015-05-22 LAB — POCT URINALYSIS DIPSTICK
Bilirubin, UA: NEGATIVE
Blood, UA: NEGATIVE
Glucose, UA: NEGATIVE
KETONES UA: NEGATIVE
LEUKOCYTES UA: NEGATIVE
NITRITE UA: NEGATIVE
PH UA: 7
PROTEIN UA: NEGATIVE
UROBILINOGEN UA: NEGATIVE

## 2015-05-22 NOTE — Progress Notes (Signed)
50 y.o. G0P0000 SingleCaucasianF here for annual exam.  Doing well.  Had bronchitis in late December and she saw Dr. Abran Cantor in early January.  She is better but still has some symptoms.    Denies vaginal bleeding.     Had total knee replacement in 9/14.  Then in 2015, she had a fractured patella.  Was braced for a year.  Did not have to have surgery.  Has to be careful when it is slippery.    No LMP recorded. Patient has had a hysterectomy.          Sexually active: Yes.    The current method of family planning is status post hysterectomy.    Exercising: No.  The patient does not participate in regular exercise at present. Smoker:  no  Health Maintenance: Pap:  2010 normal  History of abnormal Pap:  yes MMG: 08/25/12 BIRADS1:neg Colonoscopy:  Never BMD:   Never TDaP:  02/2007 Screening Labs: PCP, Hb today: PCP, Urine today: negative   reports that she quit smoking about 11 years ago. She has never used smokeless tobacco. She reports that she drinks about 7.2 oz of alcohol per week. She reports that she does not use illicit drugs.  Past Medical History  Diagnosis Date  . GERD (gastroesophageal reflux disease)   . Asthma   . 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 Hypertension   . Elevated cholesterol   . Endometriosis   . Depression   . Glaucoma   . Cataract     Past Surgical History  Procedure Laterality Date  . Anterior cruciate ligament repair  1987,2001,2003,2005  . Laparoscopic total hysterectomy  6/11  . Left knee surgery  09/2011  . Replacement total knee Left 9/14    Dr. Elliot Dally  . Laparoscopic cholecystectomy  5/14    [place], [place]  . Nasal sinus surgery  1992  . Cataract extraction Bilateral 2016    Current Outpatient Prescriptions  Medication Sig Dispense Refill  . buPROPion (WELLBUTRIN XL) 150 MG 24 hr tablet Take 1 tablet (150 mg total) by mouth daily. 90 tablet 3  . lisinopril (PRINIVIL,ZESTRIL) 10 MG tablet Take 1 tablet by mouth  daily 90 tablet 1  . Multiple  Vitamin (MULTIVITAMIN) tablet Take 1 tablet by mouth daily.    Marland Kitchen omeprazole (PRILOSEC) 40 MG capsule Take 1 capsule by mouth  daily 90 capsule 1  . sertraline (ZOLOFT) 50 MG tablet Take 1 tablet (50 mg total) by mouth daily. 90 tablet 3  . simvastatin (ZOCOR) 20 MG tablet Take 1 tablet by mouth at  bedtime 90 tablet 1  . TRANSDERM-SCOP 1.5 MG USE AS DIRECTED 4 patch 0   No current facility-administered medications for this visit.    Family History  Problem Relation Age of Onset  . Coronary artery disease Mother     PAD  . Heart attack Mother 22  . Diabetes Mother   . Lung cancer Father   . Heart attack Father 30  . Heart attack Brother 45  . Hepatitis Brother     Hep B  . Bipolar disorder Sister     ROS:  Pertinent items are noted in HPI.  Pt also noted joint pain which is a long standing problem and depression on her ROS questions.  Otherwise, a comprehensive ROS was negative.  Exam:   BP 140/80 mmHg  Pulse 88  Resp 16  Ht 5' 6.25" (1.683 m)  Wt 222 lb (100.699 kg)  BMI 35.55 kg/m2  Weight  change: +14#  Height: 5' 6.25" (168.3 cm)  Ht Readings from Last 3 Encounters:  05/22/15 5' 6.25" (1.683 m)  04/19/15  (1.676 m)  09/28/14  (1.676 m)    General appearance: alert, cooperative and appears stated age Head: Normocephalic, without obvious abnormality, atraumatic Neck: no adenopathy, supple, symmetrical, trachea midline and thyroid normal to inspection and palpation Lungs: clear to auscultation bilaterally Breasts: normal appearance, no masses or tenderness Heart: regular rate and rhythm Abdomen: soft, non-tender; bowel sounds normal; no masses,  no organomegaly Extremities: extremities normal, atraumatic, no cyanosis or edema Skin: Skin color, texture, turgor normal. No rashes or lesions Lymph nodes: Cervical, supraclavicular, and axillary nodes normal. No abnormal inguinal nodes palpated Neurologic: Grossly normal   Pelvic: External genitalia:  no  lesions              Urethra:  normal appearing urethra with no masses, tenderness or lesions              Bartholins and Skenes: normal                 Vagina: normal appearing vagina with normal color and discharge, no lesions              Cervix: absent              Pap taken: No. Bimanual Exam:  Uterus:  uterus absent              Adnexa: no mass, fullness, tenderness               Rectovaginal: Confirms               Anus:  normal sphincter tone, no lesions  Chaperone was present for exam.  A:  Well Woman with normal exam S/P robotic TLH 6/11 H/O depression but doing quite well Hypertension Elevated lipids Mildly elevated liver enzymes  P: Mammogram yearly. D/W 3D due to Grade 3 density. pap smear not indicated. H/O TLH with neg pathology.  Labs will be done with Dr. Dayton Martes this year.   return annually or prn

## 2015-06-30 ENCOUNTER — Ambulatory Visit (INDEPENDENT_AMBULATORY_CARE_PROVIDER_SITE_OTHER): Payer: 59 | Admitting: Family Medicine

## 2015-06-30 ENCOUNTER — Encounter: Payer: Self-pay | Admitting: Family Medicine

## 2015-06-30 VITALS — BP 150/90 | HR 92 | Temp 98.6°F | Ht 66.25 in | Wt 225.0 lb

## 2015-06-30 DIAGNOSIS — J209 Acute bronchitis, unspecified: Secondary | ICD-10-CM | POA: Diagnosis not present

## 2015-06-30 MED ORDER — ALBUTEROL SULFATE HFA 108 (90 BASE) MCG/ACT IN AERS: 2.0000 | INHALATION_SPRAY | RESPIRATORY_TRACT | Status: DC | PRN

## 2015-06-30 MED ORDER — HYDROCODONE-HOMATROPINE 5-1.5 MG/5ML PO SYRP: 5.0000 mL | ORAL_SOLUTION | ORAL | Status: DC | PRN

## 2015-06-30 MED ORDER — AZITHROMYCIN 250 MG PO TABS: ORAL_TABLET | ORAL | Status: DC

## 2015-06-30 NOTE — Progress Notes (Signed)
Pre visit review using our clinic review tool, if applicable. No additional management support is needed unless otherwise documented below in the visit note. 

## 2015-06-30 NOTE — Progress Notes (Signed)
   Subjective:    Patient ID: Vladimir CroftsDonna L Deas, female    DOB: 12/12/1965, 50 y.o.   MRN: [id number]  HPI Here for 3 days of chest tightness and coughing up yellow sputum. Some mild wheezing and SOB. No chest pain or fever. Using Mucinex.    Review of Systems  Constitutional: Negative.   HENT: Negative.   Eyes: Negative.   Respiratory: Positive for cough, chest tightness, shortness of breath and wheezing.   Cardiovascular: Negative.        Objective:   Physical Exam  Constitutional: She appears well-developed and well-nourished.  HENT:  Right Ear: External ear normal.  Left Ear: External ear normal.  Nose: Nose normal.  Mouth/Throat: Oropharynx is clear and moist.  Eyes: Conjunctivae are normal.  Neck: No thyromegaly present.  Pulmonary/Chest: Effort normal. She has no wheezes. She has no rales.  Scattered rhonchi   Lymphadenopathy:    She has no cervical adenopathy.          Assessment & Plan:  Bronchitis, treat with a Zpack and an inhaler prn.

## 2015-08-29 ENCOUNTER — Ambulatory Visit: Payer: Self-pay | Admitting: Obstetrics & Gynecology

## 2015-09-01 ENCOUNTER — Other Ambulatory Visit: Payer: Self-pay | Admitting: Family Medicine

## 2015-09-08 HISTORY — PX: SHOULDER SURGERY: SHX246

## 2015-09-14 ENCOUNTER — Other Ambulatory Visit: Payer: Self-pay | Admitting: Obstetrics & Gynecology

## 2015-09-14 ENCOUNTER — Telehealth: Payer: Self-pay | Admitting: Obstetrics & Gynecology

## 2015-09-14 MED ORDER — HYDROCORTISONE 2.5 % RE CREA: 1.0000 "application " | TOPICAL_CREAM | Freq: Four times a day (QID) | RECTAL | Status: DC

## 2015-09-14 NOTE — Telephone Encounter (Signed)
Spoke with patient. Patient states that she has a history of hemorrhoids. Has recently had shoulder surgery and was experiencing constipation due to pain medication for 2-3 days. Is now taking a daily stool softener and is having regular bowel movements. Since having constipation she has been having irritation from her hemorrhoid. Yesterday when she had a bowel movement she noticed blood on the tissue when she wiped. Reports she has been using preparation H which usually provides her with relief, but read she should stop use if she has any bleeding. Patient is asking if she may continue using preparation H or if there is an alternative medication she may use. Advised I will send a message to Dr.Miller and return call with further recommendations. She is agreeable.

## 2015-09-14 NOTE — Telephone Encounter (Signed)
Can you Anusol HC topically.  It is stronger and will work better.  Can you every 6 hours as needed.  Rx sent to her pharmacy on file.

## 2015-09-14 NOTE — Telephone Encounter (Signed)
Patient called and asked to speak with the nurse about "hemorrhoids."

## 2015-09-15 NOTE — Telephone Encounter (Signed)
Spoke with patient. Advised patient of message as seen below from Dr.Miller. Patient is agreeable and verbalizes understanding.  Routing to provider for final review. Patient agreeable to disposition. Will close encounter   

## 2015-11-28 ENCOUNTER — Other Ambulatory Visit: Payer: Self-pay | Admitting: Family Medicine

## 2015-11-29 NOTE — Telephone Encounter (Signed)
Pt has CPX scheduled for 12/27/15 and pt wanted to make sure could get refills for meds already requested; advised the 3 meds were sent for refill # 30 on 11/28/15 which should cover pt until seen; if pt has refill need before appt pt will cb. Pt voiced understanding.

## 2015-12-07 ENCOUNTER — Other Ambulatory Visit: Payer: Self-pay | Admitting: Family Medicine

## 2015-12-07 NOTE — Telephone Encounter (Signed)
Pt has upcoming CPE scheduled.

## 2015-12-21 DIAGNOSIS — M7501 Adhesive capsulitis of right shoulder: Secondary | ICD-10-CM | POA: Insufficient documentation

## 2015-12-27 ENCOUNTER — Ambulatory Visit (INDEPENDENT_AMBULATORY_CARE_PROVIDER_SITE_OTHER): Payer: 59 | Admitting: Family Medicine

## 2015-12-27 VITALS — BP 158/94 | HR 104 | Temp 98.3°F | Ht 66.0 in | Wt 223.5 lb

## 2015-12-27 DIAGNOSIS — Z01419 Encounter for gynecological examination (general) (routine) without abnormal findings: Secondary | ICD-10-CM

## 2015-12-27 DIAGNOSIS — F419 Anxiety disorder, unspecified: Secondary | ICD-10-CM | POA: Diagnosis not present

## 2015-12-27 DIAGNOSIS — Z0001 Encounter for general adult medical examination with abnormal findings: Secondary | ICD-10-CM

## 2015-12-27 DIAGNOSIS — G4700 Insomnia, unspecified: Secondary | ICD-10-CM

## 2015-12-27 DIAGNOSIS — I10 Essential (primary) hypertension: Secondary | ICD-10-CM

## 2015-12-27 DIAGNOSIS — Z23 Encounter for immunization: Secondary | ICD-10-CM

## 2015-12-27 DIAGNOSIS — E785 Hyperlipidemia, unspecified: Secondary | ICD-10-CM | POA: Diagnosis not present

## 2015-12-27 LAB — COMPREHENSIVE METABOLIC PANEL
ALBUMIN: 4.5 g/dL (ref 3.5–5.2)
ALK PHOS: 104 U/L (ref 39–117)
ALT: 101 U/L — AB (ref 0–35)
AST: 57 U/L — ABNORMAL HIGH (ref 0–37)
BILIRUBIN TOTAL: 0.6 mg/dL (ref 0.2–1.2)
BUN: 11 mg/dL (ref 6–23)
CO2: 28 mEq/L (ref 19–32)
Calcium: 9.5 mg/dL (ref 8.4–10.5)
Chloride: 100 mEq/L (ref 96–112)
Creatinine, Ser: 0.84 mg/dL (ref 0.40–1.20)
GFR: 76.67 mL/min (ref >60.00)
Glucose, Bld: 110 mg/dL — ABNORMAL HIGH (ref 70–99)
POTASSIUM: 4.2 meq/L (ref 3.5–5.1)
Sodium: 135 mEq/L (ref 135–145)
TOTAL PROTEIN: 7.5 g/dL (ref 6.0–8.3)

## 2015-12-27 LAB — LIPID PANEL
CHOLESTEROL: 211 mg/dL — AB (ref 0–200)
HDL: 63.3 mg/dL (ref >39.00)
LDL Cholesterol: 131 mg/dL — ABNORMAL HIGH (ref 0–99)
NonHDL: 147.32
Total CHOL/HDL Ratio: 3
Triglycerides: 83 mg/dL (ref 0.0–149.0)
VLDL: 16.6 mg/dL (ref 0.0–40.0)

## 2015-12-27 LAB — CBC WITH DIFFERENTIAL/PLATELET
BASOS ABS: 0 10*3/uL (ref 0.0–0.1)
Basophils Relative: 0.5 % (ref 0.0–3.0)
EOS PCT: 1.6 % (ref 0.0–5.0)
Eosinophils Absolute: 0.2 10*3/uL (ref 0.0–0.7)
HCT: 45.6 % (ref 36.0–46.0)
HEMOGLOBIN: 15.6 g/dL — AB (ref 12.0–15.0)
LYMPHS PCT: 19.6 % (ref 12.0–46.0)
Lymphs Abs: 2 10*3/uL (ref 0.7–4.0)
MCHC: 34.2 g/dL (ref 30.0–36.0)
MCV: 90.3 fl (ref 78.0–100.0)
MONOS PCT: 7.1 % (ref 3.0–12.0)
Monocytes Absolute: 0.7 10*3/uL (ref 0.1–1.0)
Neutro Abs: 7.2 10*3/uL (ref 1.4–7.7)
Neutrophils Relative %: 71.2 % (ref 43.0–77.0)
Platelets: 211 10*3/uL (ref 150.0–400.0)
RBC: 5.05 Mil/uL (ref 3.87–5.11)
RDW: 13.4 % (ref 11.5–15.5)
WBC: 10.1 10*3/uL (ref 4.0–10.5)

## 2015-12-27 LAB — TSH: TSH: 0.74 u[IU]/mL (ref 0.35–4.50)

## 2015-12-27 MED ORDER — LORAZEPAM 0.5 MG PO TABS: ORAL_TABLET | ORAL | 0 refills | Status: DC

## 2015-12-27 MED ORDER — LISINOPRIL 20 MG PO TABS: 20.0000 mg | ORAL_TABLET | Freq: Every day | ORAL | 3 refills | Status: DC

## 2015-12-27 NOTE — Assessment & Plan Note (Signed)
Continue trazodone 

## 2015-12-27 NOTE — Addendum Note (Signed)
Addended by: Dianne DunARON, Muneeb Veras M on: 12/27/2015 12:55 PM   Modules accepted: Orders

## 2015-12-27 NOTE — Assessment & Plan Note (Addendum)
Deteriorated. Continue current rx. She is asking to restart ativan as needed.  Discussed  Sedation and addiction potential. Rx printed and given to pt.

## 2015-12-27 NOTE — Addendum Note (Signed)
Addended by: Dianne DunARON, TALIA M on: 12/27/2015 12:48 PM   Modules accepted: Orders

## 2015-12-27 NOTE — Patient Instructions (Signed)
Great to see you. We are increasing your Lisinopril to 20 mg daily.  Please check your blood pressure at home or come see me in 2 weeks.  We will call you with your lab results and can see them online.

## 2015-12-27 NOTE — Assessment & Plan Note (Signed)
Deteriorated. Increase Lisinopril to 20 mg daily.  Recheck in 2 weeks.

## 2015-12-27 NOTE — Assessment & Plan Note (Signed)
Reviewed preventive care protocols, scheduled due services, and updated immunizations Discussed nutrition, exercise, diet, and healthy lifestyle.  Labs today. 

## 2015-12-27 NOTE — Progress Notes (Addendum)
Subjective:   Patient ID: Brenda Obrien, female    DOB: 06-01-1965, 50 y.o.   MRN: [id number]  Brenda Obrien is a pleasant 50 y.o. year old female who presents to clinic today with Annual Exam  on 12/27/2015  HPI:  Has GYN- sees Dr. Leda Quail. Remote h/o hysterectomy.  Mammogram UTD per pt- 05/2015  HTN-  Has not been well controlled since her shoulder surgery in 08/2015.  Had several complications s/p surgery- adhesive capsulitis.. No LE edema. Lab Results  Component Value Date   CREATININE 0.82 09/28/2014    Started on Trazodone for insomnia.  Has not been able to get comfortable since her shoulder injury and surgery.  Anxiety-  Taking Zoloft 50 mg daily, Wellbutrin 150 mg daily.  Anxiety has worsened since her surgery.  Work has also been more stressful.  Having more panic attacks.   HLD- taking zocor 20 mg daily. Denies myalgias. Due for labs.  Lab Results  Component Value Date   CHOL 222 (H) 09/28/2014   HDL 58.30 09/28/2014   LDLCALC 139 (H) 09/28/2014   LDLDIRECT 165.6 02/18/2011   TRIG 126.0 09/28/2014   CHOLHDL 4 09/28/2014   Lab Results  Component Value Date   CREATININE 0.82 09/28/2014   Lab Results  Component Value Date   TSH 1.40 09/28/2014   Lab Results  Component Value Date   WBC 6.8 09/28/2014   HGB 14.9 09/28/2014   HCT 45.5 09/28/2014   MCV 94.8 09/28/2014   PLT 207.0 09/28/2014   Current Outpatient Prescriptions on File Prior to Visit  Medication Sig Dispense Refill  . albuterol (PROVENTIL HFA;VENTOLIN HFA) 108 (90 Base) MCG/ACT inhaler Inhale 2 puffs into the lungs every 4 (four) hours as needed for wheezing or shortness of breath. 1 Inhaler 0  . buPROPion (WELLBUTRIN XL) 150 MG 24 hr tablet TAKE 1 TABLET BY MOUTH  DAILY. 30 tablet 0  . hydrocortisone (ANUSOL-HC) 2.5 % rectal cream Place 1 application rectally 4 (four) times daily. 30 g 0  . lisinopril (PRINIVIL,ZESTRIL) 10 MG tablet Take 1 tablet (10 mg total) by mouth daily. OFFICE  VISIT REQUIRED FOR ADDITIONAL REFILLS 90 tablet 0  . Multiple Vitamin (MULTIVITAMIN) tablet Take 1 tablet by mouth daily.    Marland Kitchen omeprazole (PRILOSEC) 40 MG capsule Take 1 capsule (40 mg total) by mouth daily. OFFICE VISIT REQUIRED FOR ADDITIONAL REFILLS 90 capsule 0  . sertraline (ZOLOFT) 50 MG tablet TAKE 1 TABLET BY MOUTH  DAILY. 30 tablet 0  . simvastatin (ZOCOR) 20 MG tablet TAKE ONE TABLET BY MOUTH AT BEDTIME. 30 tablet 0  . TRANSDERM-SCOP 1.5 MG USE AS DIRECTED 4 patch 0   No current facility-administered medications on file prior to visit.     No Known Allergies  Past Medical History:  Diagnosis Date  . Asthma   . Cataract   . Depression   . Elevated cholesterol   . Endometriosis   . GERD (gastroesophageal reflux disease)   . Glaucoma   . 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 Hypertension     Past Surgical History:  Procedure Laterality Date  . ANTERIOR CRUCIATE LIGAMENT REPAIR  1987,2001,2003,2005  . CATARACT EXTRACTION Bilateral 2016  . LAPAROSCOPIC CHOLECYSTECTOMY  5/14   [place], [place]  . LAPAROSCOPIC TOTAL HYSTERECTOMY  6/11  . left knee surgery  09/2011  . NASAL SINUS SURGERY  1992  . REPLACEMENT TOTAL KNEE Left 9/14   Dr. Elliot Dally    Family History  Problem Relation Age  of Onset  . Coronary artery disease Mother     PAD  . Heart attack Mother 1  . Diabetes Mother   . Lung cancer Father   . Heart attack Father 30  . Heart attack Brother 45  . Hepatitis Brother     Hep B  . Bipolar disorder Sister     Social History   Social History  . Marital status: Single    Spouse name: N/A  . Number of children: N/A  . Years of education: N/A   Occupational History  . 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 Smoking status: Former Smoker    Quit date: 04/15/2004  . Smokeless tobacco: Never Used  . Alcohol use 7.2 oz/week    12 Cans of beer per week     Comment: Socially  . Drug use: No  . Sexual activity: Yes    Birth control/  protection: Surgical     Comment: hysterectomy   Other Topics Concern  . Not on file   Social History Narrative   Single      No regular exercise      Works as Conservator, museum/gallery for [employer]         The PMH, PSH, Social History, Family History, Medications, and allergies have been reviewed in [facility], and have been updated if relevant.    Review of Systems  Constitutional: Negative.   HENT: Negative.   Respiratory: Negative.   Cardiovascular: Negative.   Gastrointestinal: Negative.   Endocrine: Negative.   Genitourinary: Negative.   Musculoskeletal: Negative for arthralgias.  Skin: Negative.   Allergic/Immunologic: Negative.   Neurological: Negative.   Hematological: Negative.   Psychiatric/Behavioral: Positive for sleep disturbance. Negative for dysphoric mood, self-injury and suicidal ideas. The patient is nervous/anxious.   All other systems reviewed and are negative.      Objective:    BP (!) 158/94   Pulse (!) 104   Temp 98.3 F (36.8 C) (Oral)   Ht 5\' 6"  (1.676 m)   Wt 223 lb 8 oz (101.4 kg)   SpO2 97%   BMI 36.07 kg/m    Physical Exam     General:  Well-developed,well-nourished,in no acute distress; alert,appropriate and cooperative throughout examination Head:  normocephalic and atraumatic.   Eyes:  vision grossly intact, pupils equal, pupils round, and pupils reactive to light.   Ears:  R ear normal and L ear normal.   Nose:  no external deformity.   Mouth:  good dentition.   Neck:  No deformities, masses, or tenderness noted. Breasts:  No mass, nodules, thickening, tenderness, bulging, retraction, inflamation, nipple discharge or skin changes noted.   Lungs:  Normal respiratory effort, chest expands symmetrically. Lungs are clear to auscultation, no crackles or wheezes. Heart:  Normal rate and regular rhythm. S1 and S2 normal without gallop, murmur, click, rub or other extra sounds. Abdomen:  Bowel sounds positive,abdomen soft and  non-tender without masses, organomegaly or hernias noted. Msk:  No deformity or scoliosis noted of thoracic or lumbar spine.   Extremities:  No clubbing, cyanosis, edema, or deformity noted with normal full range of motion of all joints.   Neurologic:  alert & oriented X3 and gait normal.   Skin:  Intact without suspicious lesions or rashes Cervical Nodes:  No lymphadenopathy noted Axillary Nodes:  No palpable lymphadenopathy Psych:  Cognition and judgment appear intact. Alert and cooperative with normal attention span and concentration. No  apparent delusions, illusions, hallucinations  Assessment & Plan:   Well woman exam  Essential hypertension  Dyslipidemia  Need for influenza vaccination - Plan: Flu Vaccine QUAD 36+ mos PF IM (Fluarix & Fluzone Quad PF) No Follow-up on file.

## 2015-12-27 NOTE — Assessment & Plan Note (Signed)
Continue current rx. Recheck labs today today.

## 2015-12-27 NOTE — Progress Notes (Signed)
Pre visit review using our clinic review tool, if applicable. No additional management support is needed unless otherwise documented below in the visit note. 

## 2016-01-21 ENCOUNTER — Other Ambulatory Visit: Payer: Self-pay | Admitting: Family Medicine

## 2016-02-21 ENCOUNTER — Other Ambulatory Visit: Payer: Self-pay | Admitting: Family Medicine

## 2016-03-14 ENCOUNTER — Emergency Department (HOSPITAL_COMMUNITY)
Admission: EM | Admit: 2016-03-14 | Discharge: 2016-03-15 | Disposition: A | Discharge status: Other Acute Inpatient Hospital | Payer: 59 | Attending: Emergency Medicine | Admitting: Emergency Medicine

## 2016-03-14 ENCOUNTER — Encounter (HOSPITAL_COMMUNITY): Payer: Self-pay | Admitting: *Deleted

## 2016-03-14 DIAGNOSIS — Z833 Family history of diabetes mellitus: Secondary | ICD-10-CM | POA: Diagnosis not present

## 2016-03-14 DIAGNOSIS — T424X2A Poisoning by benzodiazepines, intentional self-harm, initial encounter: Secondary | ICD-10-CM | POA: Insufficient documentation

## 2016-03-14 DIAGNOSIS — Z79899 Other long term (current) drug therapy: Secondary | ICD-10-CM | POA: Diagnosis not present

## 2016-03-14 DIAGNOSIS — F17210 Nicotine dependence, cigarettes, uncomplicated: Secondary | ICD-10-CM | POA: Insufficient documentation

## 2016-03-14 DIAGNOSIS — Z9889 Other specified postprocedural states: Secondary | ICD-10-CM | POA: Diagnosis not present

## 2016-03-14 DIAGNOSIS — Y638 Failure in dosage during other surgical and medical care: Secondary | ICD-10-CM | POA: Insufficient documentation

## 2016-03-14 DIAGNOSIS — Z96652 Presence of left artificial knee joint: Secondary | ICD-10-CM | POA: Insufficient documentation

## 2016-03-14 DIAGNOSIS — I10 Essential (primary) hypertension: Secondary | ICD-10-CM | POA: Insufficient documentation

## 2016-03-14 DIAGNOSIS — T50902A Poisoning by unspecified drugs, medicaments and biological substances, intentional self-harm, initial encounter: Secondary | ICD-10-CM

## 2016-03-14 DIAGNOSIS — T1491XA Suicide attempt, initial encounter: Secondary | ICD-10-CM

## 2016-03-14 DIAGNOSIS — F331 Major depressive disorder, recurrent, moderate: Secondary | ICD-10-CM | POA: Insufficient documentation

## 2016-03-14 DIAGNOSIS — F332 Major depressive disorder, recurrent severe without psychotic features: Secondary | ICD-10-CM

## 2016-03-14 DIAGNOSIS — J45909 Unspecified asthma, uncomplicated: Secondary | ICD-10-CM | POA: Diagnosis not present

## 2016-03-14 DIAGNOSIS — Z8249 Family history of ischemic heart disease and other diseases of the circulatory system: Secondary | ICD-10-CM | POA: Diagnosis not present

## 2016-03-14 LAB — COMPREHENSIVE METABOLIC PANEL
ALBUMIN: 4.5 g/dL (ref 3.5–5.0)
ALT: 96 U/L — ABNORMAL HIGH (ref 14–54)
ANION GAP: 10 (ref 5–15)
AST: 54 U/L — AB (ref 15–41)
Alkaline Phosphatase: 99 U/L (ref 38–126)
BILIRUBIN TOTAL: 0.6 mg/dL (ref 0.3–1.2)
BUN: 7 mg/dL (ref 6–20)
CHLORIDE: 108 mmol/L (ref 101–111)
CO2: 23 mmol/L (ref 22–32)
Calcium: 8.7 mg/dL — ABNORMAL LOW (ref 8.9–10.3)
Creatinine, Ser: 0.86 mg/dL (ref 0.44–1.00)
GFR calc Af Amer: >60 mL/min (ref >60)
GFR calc non Af Amer: >60 mL/min (ref >60)
GLUCOSE: 106 mg/dL — AB (ref 65–99)
POTASSIUM: 4 mmol/L (ref 3.5–5.1)
Sodium: 141 mmol/L (ref 135–145)
TOTAL PROTEIN: 7.5 g/dL (ref 6.5–8.1)

## 2016-03-14 LAB — URINALYSIS, ROUTINE W REFLEX MICROSCOPIC
BILIRUBIN URINE: NEGATIVE
GLUCOSE, UA: NEGATIVE mg/dL
HGB URINE DIPSTICK: NEGATIVE
KETONES UR: NEGATIVE mg/dL
Leukocytes, UA: NEGATIVE
Nitrite: NEGATIVE
PH: 6 (ref 5.0–8.0)
Protein, ur: NEGATIVE mg/dL
Specific Gravity, Urine: 1.005 (ref 1.005–1.030)

## 2016-03-14 LAB — CBC WITH DIFFERENTIAL/PLATELET
BASOS ABS: 0 10*3/uL (ref 0.0–0.1)
Basophils Relative: 0 %
Eosinophils Absolute: 0.2 10*3/uL (ref 0.0–0.7)
Eosinophils Relative: 2 %
HEMATOCRIT: 44.1 % (ref 36.0–46.0)
Hemoglobin: 14.7 g/dL (ref 12.0–15.0)
LYMPHS ABS: 2.6 10*3/uL (ref 0.7–4.0)
LYMPHS PCT: 30 %
MCH: 31.7 pg (ref 26.0–34.0)
MCHC: 33.3 g/dL (ref 30.0–36.0)
MCV: 95.2 fL (ref 78.0–100.0)
MONO ABS: 0.6 10*3/uL (ref 0.1–1.0)
Monocytes Relative: 8 %
NEUTROS ABS: 5 10*3/uL (ref 1.7–7.7)
Neutrophils Relative %: 60 %
Platelets: 211 10*3/uL (ref 150–400)
RBC: 4.63 MIL/uL (ref 3.87–5.11)
RDW: 13.6 % (ref 11.5–15.5)
WBC: 8.4 10*3/uL (ref 4.0–10.5)

## 2016-03-14 LAB — ETHANOL: ALCOHOL ETHYL (B): 187 mg/dL — AB (ref <5)

## 2016-03-14 LAB — RAPID URINE DRUG SCREEN, HOSP PERFORMED
AMPHETAMINES: NOT DETECTED
BARBITURATES: NOT DETECTED
BENZODIAZEPINES: POSITIVE — AB
Cocaine: NOT DETECTED
Opiates: NOT DETECTED
Tetrahydrocannabinol: NOT DETECTED

## 2016-03-14 LAB — ACETAMINOPHEN LEVEL

## 2016-03-14 LAB — SALICYLATE LEVEL: Salicylate Lvl: <7 mg/dL (ref 2.8–30.0)

## 2016-03-14 LAB — POC URINE PREG, ED: Preg Test, Ur: NEGATIVE

## 2016-03-14 MED ORDER — SODIUM CHLORIDE 0.9 % IV SOLN
INTRAVENOUS | Status: DC
  Administered 2016-03-14: 19:00:00 via INTRAVENOUS

## 2016-03-14 MED ORDER — TEMAZEPAM 15 MG PO CAPS: 30.0000 mg | ORAL_CAPSULE | Freq: Every evening | ORAL | Status: DC | PRN

## 2016-03-14 MED ORDER — PANTOPRAZOLE SODIUM 40 MG PO TBEC
40.0000 mg | DELAYED_RELEASE_TABLET | Freq: Every day | ORAL | Status: DC
  Administered 2016-03-15: 40 mg via ORAL
  Filled 2016-03-14: qty 1

## 2016-03-14 MED ORDER — LORAZEPAM 0.5 MG PO TABS: 0.5000 mg | ORAL_TABLET | Freq: Four times a day (QID) | ORAL | Status: DC | PRN

## 2016-03-14 MED ORDER — SERTRALINE HCL 50 MG PO TABS
50.0000 mg | ORAL_TABLET | Freq: Every day | ORAL | Status: DC
  Administered 2016-03-15: 50 mg via ORAL
  Filled 2016-03-14: qty 1

## 2016-03-14 MED ORDER — BUPROPION HCL ER (XL) 150 MG PO TB24
150.0000 mg | ORAL_TABLET | Freq: Every day | ORAL | Status: DC
  Administered 2016-03-15: 150 mg via ORAL
  Filled 2016-03-14: qty 1

## 2016-03-14 MED ORDER — ONDANSETRON HCL 4 MG PO TABS
4.0000 mg | ORAL_TABLET | Freq: Three times a day (TID) | ORAL | Status: DC | PRN
  Administered 2016-03-15: 4 mg via ORAL
  Filled 2016-03-14: qty 1

## 2016-03-14 MED ORDER — ALBUTEROL SULFATE HFA 108 (90 BASE) MCG/ACT IN AERS: 2.0000 | INHALATION_SPRAY | RESPIRATORY_TRACT | Status: DC | PRN

## 2016-03-14 MED ORDER — ACETAMINOPHEN 325 MG PO TABS: 650.0000 mg | ORAL_TABLET | ORAL | Status: DC | PRN

## 2016-03-14 MED ORDER — SIMVASTATIN 20 MG PO TABS
20.0000 mg | ORAL_TABLET | Freq: Every day | ORAL | Status: DC
Start: 2016-03-14 — End: 2016-03-15
  Administered 2016-03-14: 20 mg via ORAL
  Filled 2016-03-14: qty 1

## 2016-03-14 MED ORDER — SODIUM CHLORIDE 0.9 % IV BOLUS (SEPSIS)
1000.0000 mL | Freq: Once | INTRAVENOUS | Status: AC
  Administered 2016-03-14: 1000 mL via INTRAVENOUS

## 2016-03-14 MED ORDER — LISINOPRIL 20 MG PO TABS
20.0000 mg | ORAL_TABLET | Freq: Every day | ORAL | Status: DC
  Administered 2016-03-15: 20 mg via ORAL
  Filled 2016-03-14: qty 1

## 2016-03-14 MED ORDER — ONDANSETRON HCL 4 MG/2ML IJ SOLN
4.0000 mg | Freq: Once | INTRAMUSCULAR | Status: AC
  Administered 2016-03-14: 4 mg via INTRAVENOUS
  Filled 2016-03-14: qty 2
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
## 2016-03-14 NOTE — ED Notes (Signed)
Pt is calm and cooperative and is drowsy. Sitter is at bedside. Pt has a friend Brenda Obrien visiting.

## 2016-03-14 NOTE — ED Notes (Signed)
Jacki ConesLaurie from poison control has cleared patient.

## 2016-03-14 NOTE — ED Notes (Signed)
Pt wanded °

## 2016-03-14 NOTE — ED Notes (Signed)
Patient pregnancy test was negative .   Labs are not crossing over in lab system

## 2016-03-14 NOTE — ED Notes (Signed)
Pt found out today around 3pm that she was to have another shoulder surgery.  Pt is very nervous about this because she is scared about the pain.  Pt also states that she recently found out that she will have to remodel her home that she has recently remodeled.

## 2016-03-14 NOTE — ED Notes (Signed)
Pt's belongings given to pt's friend, Andrey CampanileSandy, to take home.  Items given to pt's friend included jewelry, iphone, sweat pants, shirt, jacket, and headphones. Pt gave permission for belongings to be given to friend.

## 2016-03-14 NOTE — ED Notes (Signed)
Pt is confused and has to be re-oriented as to why she is here.  Visitor now in room and pt is sleeping.  Pt was snoring and O2 dropped to 89%.  Pt placed on 2L [place] and O2 rose up to 98%.

## 2016-03-14 NOTE — ED Notes (Signed)
Bed: RESB Expected date:  Expected time:  Means of arrival:  Comments: EMS- 50yo F, confusion/OD on Benzos

## 2016-03-14 NOTE — ED Triage Notes (Signed)
Per EMS report: pt coming from home and took an unknown amount of 30 mg Temazepam and 0.5 Lorazepam in an attempt to "check out."  On arrival, pt is confused and c/o of right shoulder pain. IV established by EMS. EMS received call for pt at 16:20.

## 2016-03-14 NOTE — ED Notes (Signed)
Bed: WA15 Expected date:  Expected time:  Means of arrival:  Comments: RES B 

## 2016-03-15 ENCOUNTER — Inpatient Hospital Stay (HOSPITAL_COMMUNITY)
Admission: AD | Admit: 2016-03-15 | Discharge: 2016-03-19 | DRG: 885 | Disposition: A | Discharge status: Home / Self Care | Payer: 59 | Attending: Psychiatry | Admitting: Psychiatry

## 2016-03-15 ENCOUNTER — Encounter (HOSPITAL_COMMUNITY): Payer: Self-pay

## 2016-03-15 DIAGNOSIS — Z818 Family history of other mental and behavioral disorders: Secondary | ICD-10-CM

## 2016-03-15 DIAGNOSIS — Z87891 Personal history of nicotine dependence: Secondary | ICD-10-CM

## 2016-03-15 DIAGNOSIS — F332 Major depressive disorder, recurrent severe without psychotic features: Secondary | ICD-10-CM

## 2016-03-15 DIAGNOSIS — Z79899 Other long term (current) drug therapy: Secondary | ICD-10-CM

## 2016-03-15 DIAGNOSIS — J45909 Unspecified asthma, uncomplicated: Secondary | ICD-10-CM | POA: Diagnosis present

## 2016-03-15 DIAGNOSIS — R45851 Suicidal ideations: Secondary | ICD-10-CM

## 2016-03-15 DIAGNOSIS — E669 Obesity, unspecified: Secondary | ICD-10-CM | POA: Diagnosis present

## 2016-03-15 DIAGNOSIS — Z9889 Other specified postprocedural states: Secondary | ICD-10-CM

## 2016-03-15 DIAGNOSIS — I10 Essential (primary) hypertension: Secondary | ICD-10-CM | POA: Diagnosis present

## 2016-03-15 DIAGNOSIS — F17210 Nicotine dependence, cigarettes, uncomplicated: Secondary | ICD-10-CM | POA: Diagnosis present

## 2016-03-15 DIAGNOSIS — Z8249 Family history of ischemic heart disease and other diseases of the circulatory system: Secondary | ICD-10-CM

## 2016-03-15 DIAGNOSIS — K219 Gastro-esophageal reflux disease without esophagitis: Secondary | ICD-10-CM | POA: Diagnosis present

## 2016-03-15 DIAGNOSIS — Z9841 Cataract extraction status, right eye: Secondary | ICD-10-CM | POA: Diagnosis not present

## 2016-03-15 DIAGNOSIS — T424X2A Poisoning by benzodiazepines, intentional self-harm, initial encounter: Secondary | ICD-10-CM | POA: Diagnosis not present

## 2016-03-15 DIAGNOSIS — Z9049 Acquired absence of other specified parts of digestive tract: Secondary | ICD-10-CM | POA: Diagnosis not present

## 2016-03-15 DIAGNOSIS — Z9842 Cataract extraction status, left eye: Secondary | ICD-10-CM

## 2016-03-15 DIAGNOSIS — H409 Unspecified glaucoma: Secondary | ICD-10-CM | POA: Diagnosis present

## 2016-03-15 DIAGNOSIS — Z96652 Presence of left artificial knee joint: Secondary | ICD-10-CM | POA: Diagnosis present

## 2016-03-15 DIAGNOSIS — Z833 Family history of diabetes mellitus: Secondary | ICD-10-CM | POA: Diagnosis not present

## 2016-03-15 DIAGNOSIS — Z801 Family history of malignant neoplasm of trachea, bronchus and lung: Secondary | ICD-10-CM | POA: Diagnosis not present

## 2016-03-15 DIAGNOSIS — Z90710 Acquired absence of both cervix and uterus: Secondary | ICD-10-CM | POA: Diagnosis not present

## 2016-03-15 MED ORDER — TRAZODONE HCL 50 MG PO TABS
50.0000 mg | ORAL_TABLET | Freq: Every evening | ORAL | Status: DC | PRN
Start: 2016-03-15 — End: 2016-03-19
  Administered 2016-03-16 – 2016-03-18 (×3): 50 mg via ORAL
  Filled 2016-03-15 (×4): qty 1

## 2016-03-15 MED ORDER — LORAZEPAM 0.5 MG PO TABS: 0.5000 mg | ORAL_TABLET | Freq: Four times a day (QID) | ORAL | Status: DC | PRN

## 2016-03-15 MED ORDER — BUPROPION HCL ER (XL) 150 MG PO TB24
150.0000 mg | ORAL_TABLET | Freq: Every day | ORAL | Status: DC
  Administered 2016-03-16 – 2016-03-19 (×4): 150 mg via ORAL
  Filled 2016-03-15 (×5): qty 1

## 2016-03-15 MED ORDER — TRAMADOL HCL 50 MG PO TABS
50.0000 mg | ORAL_TABLET | Freq: Four times a day (QID) | ORAL | Status: DC | PRN
  Administered 2016-03-16 – 2016-03-19 (×9): 50 mg via ORAL
  Filled 2016-03-15 (×10): qty 1

## 2016-03-15 MED ORDER — ALUM & MAG HYDROXIDE-SIMETH 200-200-20 MG/5ML PO SUSP: 30.0000 mL | ORAL | Status: DC | PRN

## 2016-03-15 MED ORDER — IBUPROFEN 200 MG PO TABS: 600.0000 mg | ORAL_TABLET | Freq: Four times a day (QID) | ORAL | Status: DC | PRN

## 2016-03-15 MED ORDER — TRAMADOL HCL 50 MG PO TABS
50.0000 mg | ORAL_TABLET | Freq: Four times a day (QID) | ORAL | Status: DC | PRN
  Administered 2016-03-15: 50 mg via ORAL
  Filled 2016-03-15: qty 1

## 2016-03-15 MED ORDER — MAGNESIUM HYDROXIDE 400 MG/5ML PO SUSP: 30.0000 mL | Freq: Every day | ORAL | Status: DC | PRN

## 2016-03-15 MED ORDER — SERTRALINE HCL 50 MG PO TABS
50.0000 mg | ORAL_TABLET | Freq: Every day | ORAL | Status: DC
  Administered 2016-03-16 – 2016-03-19 (×4): 50 mg via ORAL
  Filled 2016-03-15 (×5): qty 1

## 2016-03-15 MED ORDER — ACETAMINOPHEN 325 MG PO TABS: 650.0000 mg | ORAL_TABLET | Freq: Four times a day (QID) | ORAL | Status: DC | PRN
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
## 2016-03-15 NOTE — Tx Team (Signed)
Initial Treatment Plan 03/15/2016 5:53 PM Brenda CroftsDonna L Dougan [id number]    PATIENT STRESSORS: Health problems Loss of Mother 7 years ago on 03/13/16 and loss of brother and sister to suicide 2 years ago (it was also 2 weeks apart)   PATIENT STRENGTHS: Capable of independent living MetallurgistCommunication skills Financial means General fund of knowledge Motivation for treatment/growth Supportive family/friends   PATIENT IDENTIFIED PROBLEMS: Depression  Anxiety  Suicidal ideation  "I don't want to end up like my brother and sister"  "I want to be out of the hospital so I can have my surgery on 03/20/16"             DISCHARGE CRITERIA:  Improved stabilization in mood, thinking, and/or behavior Verbal commitment to aftercare and medication compliance  PRELIMINARY DISCHARGE PLAN: Outpatient therapy Medication management  PATIENT/FAMILY INVOLVEMENT: This treatment plan has been presented to and reviewed with the patient, Brenda CroftsDonna L Obrien.  The patient and family have been given the opportunity to ask questions and make suggestions.  Levin BaconHeather V Berdie Malter, RN 03/15/2016, 5:53 PM

## 2016-03-15 NOTE — Plan of Care (Signed)
Problem: Safety: Goal: Periods of time without injury will increase Outcome: Progressing Pt safe on the unit at this time   

## 2016-03-15 NOTE — ED Notes (Signed)
Patient noted in room. No complaints, stable, in no acute distress. Q15 minute rounds and monitoring via Security Cameras to continue.  
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
## 2016-03-15 NOTE — Progress Notes (Signed)
03/15/16 1342:  LRT went to pt room, pt was sleep.  Caroll RancherMarjette Jaloni Davoli, LRT/CTRS

## 2016-03-15 NOTE — Progress Notes (Signed)
D: Pt denies SI/HI/AVH. Pt is pleasant and cooperative. Pt isolated to her room this evening.  A: Pt was offered support and encouragement. Pt was given scheduled medications. Pt was encourage to attend groups. Q 15 minute checks were done for safety.   R: Pt has no complaints.Pt receptive to treatment and safety maintained on unit.
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
## 2016-03-15 NOTE — BH Assessment (Signed)
[facility] Assessment Progress Note  Per Thedore MinsMojeed Akintayo, MD, this pt requires psychiatric hospitalization.  Berneice Heinrichina Tate, RN, [facility]; they will be ready to receive pt at 14:30.  Pt presents under IVC, and [facility]  Pt's nurse, Rudean HittDawnaly, has been notified, and agrees to call report to [phone].  Pt is to be transported via [employer].   Doylene Canninghomas Camay Pedigo, MA Triage Specialist [phone]

## 2016-03-15 NOTE — Progress Notes (Signed)
Pt did not attend wrap-up group   

## 2016-03-16 DIAGNOSIS — Z801 Family history of malignant neoplasm of trachea, bronchus and lung: Secondary | ICD-10-CM

## 2016-03-16 DIAGNOSIS — Z833 Family history of diabetes mellitus: Secondary | ICD-10-CM

## 2016-03-16 DIAGNOSIS — F332 Major depressive disorder, recurrent severe without psychotic features: Principal | ICD-10-CM

## 2016-03-16 DIAGNOSIS — Z8249 Family history of ischemic heart disease and other diseases of the circulatory system: Secondary | ICD-10-CM

## 2016-03-16 DIAGNOSIS — Z818 Family history of other mental and behavioral disorders: Secondary | ICD-10-CM

## 2016-03-16 DIAGNOSIS — Z9889 Other specified postprocedural states: Secondary | ICD-10-CM

## 2016-03-16 DIAGNOSIS — Z79899 Other long term (current) drug therapy: Secondary | ICD-10-CM

## 2016-03-16 MED ORDER — LISINOPRIL 20 MG PO TABS
20.0000 mg | ORAL_TABLET | Freq: Every day | ORAL | Status: DC
  Administered 2016-03-16 – 2016-03-19 (×4): 20 mg via ORAL
  Filled 2016-03-16 (×6): qty 1

## 2016-03-16 MED ORDER — IBUPROFEN 600 MG PO TABS
600.0000 mg | ORAL_TABLET | Freq: Four times a day (QID) | ORAL | Status: DC | PRN
  Administered 2016-03-16 – 2016-03-17 (×3): 600 mg via ORAL
  Filled 2016-03-16 (×3): qty 1

## 2016-03-16 MED ORDER — FAMOTIDINE 20 MG PO TABS
40.0000 mg | ORAL_TABLET | Freq: Every day | ORAL | Status: DC
  Administered 2016-03-16 – 2016-03-19 (×4): 40 mg via ORAL
  Filled 2016-03-16 (×4): qty 2
  Filled 2016-03-16 (×3): qty 1

## 2016-03-16 NOTE — [facility] Group Notes (Signed)
[facility] Group Notes:  (Nursing/MHT/Case Management/Adjunct)  Date:  03/16/2016  Time:  1100  Type of Therapy:  Nurse Education  /  Life Skills :  The group focuses on teaching patients how to identify their needs and develop skills needed to get them met.  Participation Level:  Active  Participation Quality:  Attentive  Affect:  Appropriate  Cognitive:  Appropriate  Insight:  Limited  Engagement in Group:  Engaged  Modes of Intervention:  Education  Summary of Progress/Problems:  Brenda Obrien 03/16/2016, 5:29 PM

## 2016-03-16 NOTE — [facility] Suicide Risk Assessment (Signed)
[facility]   Nursing information obtained from:  Patient Demographic factors:  Caucasian, Gay, lesbian, or bisexual orientation, Living alone Current Mental Status:  NA Loss Factors:  Loss of significant relationship, Decline in physical health Historical Factors:  Family history of suicide, Family history of mental illness or substance abuse Risk Reduction Factors:  Employed  Total Time spent with patient: 1.5 hours Principal Problem: <principal problem not specified> Diagnosis:   Patient Active Problem List   Diagnosis Date Noted  . MDD (major depressive disorder), recurrent severe, without psychosis ([facility]) [F33.2] 03/15/2016  . MDD (major depressive disorder), recurrent episode, severe ([facility]) [F33.2] 03/15/2016  . Insomnia [G47.00] 12/27/2015  . Well woman exam [Z01.419] 09/28/2014  . Closed fracture of patella [S82.009A] 04/28/2014  . Obesity, unspecified [E66.9] 09/20/2013  . Carpal tunnel syndrome [G56.00] 09/20/2013  . Allergic rhinitis [J30.9] 09/20/2013  . Anxiety [F41.9] 03/13/2012  . Hyperglycemia [R73.9] 02/03/2012  . Hypertension [I10] 01/21/2012  . Anterior cruciate ligament complete tear [S83.519A] 07/01/2011  . Dyslipidemia [E78.5] 11/22/2010  . GERD [K21.9] 03/27/2009   Subjective Data: Alert oriented, worried about her right shoulder, depressed .  Continued Clinical Symptoms:  Alcohol Use Disorder Identification Test Final Score (AUDIT): 5 The "Alcohol Use Disorders Identification Test", Guidelines for Use in Primary Care, Second Edition.  World Science writerHealth Organization [facility](WHO). Score between 0-7:  no or low risk or alcohol related problems. Score between 8-15:  moderate risk of alcohol related problems. Score between 16-19:  high risk of alcohol related problems. Score 20 or above:  warrants further diagnostic evaluation for alcohol dependence and treatment.   CLINICAL FACTORS:   Depression:   Anhedonia Impulsivity Alcohol/Substance  Abuse/Dependencies   Musculoskeletal: Strength & Muscle Tone: within normal limits Gait & Station: normal Patient leans: no lean  Psychiatric Specialty Exam: Physical Exam  Constitutional: She appears well-developed.  HENT:  Head: Normocephalic.    Review of Systems  Cardiovascular: Negative for chest pain.  Musculoskeletal: Positive for joint pain.  Skin: Negative for rash.  Psychiatric/Behavioral: Positive for depression. Negative for hallucinations.    Blood pressure 124/79, pulse 92, temperature 98.6 F (37 C), resp. rate 18, height 5\' 6"  (1.676 m), weight 99.8 kg (220 lb), SpO2 97 %.Body mass index is 35.51 kg/m.  General Appearance: Casual  Eye Contact:  Fair  Speech:  Normal Rate  Volume:  Decreased  Mood:  Depressed  Affect:  Congruent  Thought Process:  Goal Directed  Orientation:  Full (Time, Place, and Person)  Thought Content:  Rumination  Suicidal Thoughts:  No  Homicidal Thoughts:  No  Memory:  Immediate;   Fair Recent;   Fair  Judgement:  Fair  Insight:  Shallow  Psychomotor Activity:  Decreased  Concentration:  Concentration: Fair and Attention Span: Fair  Recall:  [employer]  Fund of Knowledge:  Fair  Language:  Good  Akathisia:  Negative  Handed:  Right  AIMS (if indicated):     Assets:  Desire for Improvement  ADL's:  Intact  Cognition:  WNL  Sleep:  Number of Hours: 4.75      COGNITIVE FEATURES THAT CONTRIBUTE TO RISK:  Closed-mindedness    SUICIDE RISK:   Moderate:  Frequent suicidal ideation with limited intensity, and duration, some specificity in terms of plans, no associated intent, good self-control, limited dysphoria/symptomatology, some risk factors present, and identifiable protective factors, including available and accessible social support.   PLAN OF CARE: admit for stabilization. Depression symptoms and medication management.   I  certify that inpatient services furnished can reasonably be expected to improve the patient's  condition.  Thresa RossAKHTAR, Latrelle Fuston, MD 03/16/2016, 12:27 PM
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
## 2016-03-16 NOTE — Progress Notes (Signed)
Adult Psychoeducational Group Note  Date:  03/16/2016 Time:  9:49 PM  Group Topic/Focus:  Wrap-Up Group:   The focus of this group is to help patients review their daily goal of treatment and discuss progress on daily workbooks.   Participation Level:  Active  Participation Quality:  Appropriate  Affect:  Appropriate  Cognitive:  Alert  Insight: Appropriate  Engagement in Group:  Engaged  Modes of Intervention:  Discussion  Additional Comments:  Pt rated her day 8/10. She enjoyed meeting her peers today. She has been working on self-reflection and is ready to get back to work. Brenda HailstoneCOOKE, Brenda Obrien R 03/16/2016, 9:49 PM

## 2016-03-16 NOTE — [facility] Group Notes (Signed)
Adult Group Therapy Note  Date:  03/16/2016  Time: 9:00AM-10:00AM  Group Topic/Focus: Today's group focused on the topic of healthy and unhealthy coping skills.  Commonalities were found in group members, including isolation, anger outbursts, internalizing problems, using marijuana or cocaine, stopping therapy in order to not have to feel things, smoking tobacco, bottling things up, and picking at skin/nails.  The reasons unhealthy coping skills are used were listed by group members, with a focus on them being fast, easy, and initially working.  The positive of making any decision to change were briefly listed.  Finally, we discussed how group members would go about implementing changes.  An emphasis was placed on being accountable to oneself as well as someone else when one is trying to change.  Participation Level:  Active  Participation Quality:  Attentive  Affect:  Appropriate  Cognitive:  Appropriate  Insight: Improving  Engagement in Group:  Developing/Improving  Modes of Intervention:  Discussion and Support  Additional Comments:  The patient expressed one of his/her unhealthy coping skills is isolation and one of his/her healthy coping skills is helping friends.  She was out with a provider for awhile.  She listened but did not speak while in group.  Carloyn JaegerMareida J Grossman-Orr 03/16/2016 , 1:03 PM
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
## 2016-03-17 DIAGNOSIS — F17210 Nicotine dependence, cigarettes, uncomplicated: Secondary | ICD-10-CM
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
## 2016-03-17 NOTE — [facility] Group Notes (Signed)
Adult Therapy Group Note  Date:  03/17/2016  Time:  9:00-10:00AM  Group Topic/Focus: Fears and Healthy/Unhealthy Coping Skills  Building Self Esteem:   The Focus of this group was to discuss some of the prevalent fears that patients experience, and to identify the commonalities among group members.  An exercise was used to initiate the discussion, followed by writing on the white board a group-generated list of unhealthy coping and healthy coping techniques to deal with each fear, as well as supports that could help in using healthy coping.  This included a variety of supports, and CSW emphasized professional supports such as therapist, support groups and psychiatrist.  At the end of group, a song was played that group members danced to, at their request.  Participation Level:  Active  Participation Quality:  Attentive  Affect:  Anxious  Cognitive:  Appropriate  Insight: Good  Engagement in Group:  Engaged  Modes of Intervention:  Discussion, Exploration and Support  Additional Comments:  The patient expressed that one thing people did not know about her just by looking at her is that she has social anxiety and is very shy.  She talked very little in group but listened attentively.  Ambrose MantleMareida Grossman-Orr, LCSW 03/17/2016   12:35pm
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
## 2016-03-17 NOTE — Progress Notes (Signed)
Writer spoke with Brenda Obrien 1:1 and she reports having had a good day. She reports that the tramadol has helped with her pain and made it more bearable but she reports she has constant pain. She reports that this shoulder operation has caused her the most pain in her life. She is hopeful that the surgery on Dec 6th will resolve her pain issue. She had family to visit tonight, She requested motrin and trazadone before going to bed. Support given and safety maintained on unit with 15 min checks.

## 2016-03-17 NOTE — Progress Notes (Signed)
Adult Psychoeducational Group Note  Date:  03/17/2016 Time:  10:26 PM  Group Topic/Focus:  Wrap-Up Group:   The focus of this group is to help patients review their daily goal of treatment and discuss progress on daily workbooks.   Participation Level:  Active  Participation Quality:  Appropriate  Affect:  Appropriate  Cognitive:  Appropriate  Insight: Appropriate  Engagement in Group:  Engaged  Modes of Intervention:  Discussion  Additional Comments: Patient attended wrap-up group and said that her day was a 9.  Something good about her day was that she made new friends.   Fernande Treiber W Shanard Treto 03/17/2016, 10:26 PM
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
## 2016-03-18 NOTE — [facility] Suicide Risk Assessment (Signed)
[facility] INPATIENT:  Family/Significant Other Suicide Prevention Education  Suicide Prevention Education:  Education Completed; Allyne GeeMarsha Owens, Pt's friend [phone], has been identified by the patient as the family member/significant other with whom the patient will be residing, and identified as the person(s) who will aid the patient in the event of a mental health crisis (suicidal ideations/suicide attempt).  With written consent from the patient, the family member/significant other has been provided the following suicide prevention education, prior to the and/or following the discharge of the patient.  The suicide prevention education provided includes the following:  Suicide risk factors  Suicide prevention and interventions  National Suicide Hotline telephone number  [facility] assessment telephone number  [facility] Assistance 911  [facility] and/or Residential Mobile Crisis Unit telephone number  Request made of family/significant other to:  Remove weapons (e.g., guns, rifles, knives), all items previously/currently identified as safety concern.    Remove drugs/medications (over-the-counter, prescriptions, illicit drugs), all items previously/currently identified as a safety concern.  The family member/significant other verbalizes understanding of the suicide prevention education information provided.  The family member/significant other agrees to remove the items of safety concern listed above.  Verdene LennertLauren C Shivaan Tierno 03/18/2016, 4:09 PM

## 2016-03-18 NOTE — Tx Team (Signed)
Interdisciplinary Treatment and Diagnostic Plan Update  03/18/2016 Time of Session: 11:15 AM  Brenda Obrien MRN: [id number]  Principal Diagnosis: MDD (major depressive disorder), recurrent episode, severe ([facility])  Secondary Diagnoses: Principal Problem:   MDD (major depressive disorder), recurrent episode, severe ([facility])   Current Medications:  Current Facility-Administered Medications  Medication Dose Route Frequency Provider Last Rate Last Dose  . acetaminophen (TYLENOL) tablet 650 mg  650 mg Oral Q6H PRN Thermon Leyland, NP      . alum & mag hydroxide-simeth (MAALOX/MYLANTA) 200-200-20 MG/5ML suspension 30 mL  30 mL Oral Q4H PRN Thermon Leyland, NP      . buPROPion (WELLBUTRIN XL) 24 hr tablet 150 mg  150 mg Oral Daily Thermon Leyland, NP   150 mg at 03/18/16 5621  . famotidine (PEPCID) tablet 40 mg  40 mg Oral Daily Oneta Rack, NP   40 mg at 03/18/16 3086  . ibuprofen (ADVIL,MOTRIN) tablet 600 mg  600 mg Oral Q6H PRN Oneta Rack, NP   600 mg at 03/17/16 2044  . lisinopril (PRINIVIL,ZESTRIL) tablet 20 mg  20 mg Oral Daily Oneta Rack, NP   20 mg at 03/18/16 0839  . LORazepam (ATIVAN) tablet 0.5 mg  0.5 mg Oral Q6H PRN Thermon Leyland, NP      . magnesium hydroxide (MILK OF MAGNESIA) suspension 30 mL  30 mL Oral Daily PRN Thermon Leyland, NP      . sertraline (ZOLOFT) tablet 50 mg  50 mg Oral Daily Thermon Leyland, NP   50 mg at 03/18/16 0839  . traMADol (ULTRAM) tablet 50 mg  50 mg Oral Q6H PRN Thermon Leyland, NP   50 mg at 03/18/16 0840  . traZODone (DESYREL) tablet 50 mg  50 mg Oral QHS PRN Thermon Leyland, NP   50 mg at 03/17/16 2124    PTA Medications: Prescriptions Prior to Admission  Medication Sig Dispense Refill Last Dose  . albuterol (PROVENTIL HFA;VENTOLIN HFA) 108 (90 Base) MCG/ACT inhaler Inhale 2 puffs into the lungs every 4 (four) hours as needed for wheezing or shortness of breath. 1 Inhaler 0 Past Month at Unknown time  . buPROPion (WELLBUTRIN XL) 150 MG 24 hr tablet TAKE 1  TABLET BY MOUTH  DAILY. 90 tablet 2 03/14/2016 at Unknown time  . hydrocortisone (ANUSOL-HC) 2.5 % rectal cream Place 1 application rectally 4 (four) times daily. 30 g 0 Taking  . lisinopril (PRINIVIL,ZESTRIL) 20 MG tablet Take 1 tablet (20 mg total) by mouth daily. 90 tablet 3 03/14/2016 at Unknown time  . LORazepam (ATIVAN) 0.5 MG tablet TAKE 1TABLET BY MOUTH EVERY 8 HOURS AS NEEDED FOR ANXIETY 30 tablet 0 03/14/2016 at Unknown time  . Multiple Vitamin (MULTIVITAMIN) tablet Take 1 tablet by mouth daily.   Past Month at Unknown time  . omeprazole (PRILOSEC) 40 MG capsule TAKE 1 CAPSULE BY MOUTH  DAILY. OFFICE VISIT  REQUIRED FOR ADDITIONAL  REFILLS 90 capsule 1 03/14/2016 at Unknown time  . sertraline (ZOLOFT) 50 MG tablet TAKE 1 TABLET BY MOUTH  DAILY. 90 tablet 2 03/14/2016 at Unknown time  . simvastatin (ZOCOR) 20 MG tablet TAKE ONE TABLET BY MOUTH AT BEDTIME. 90 tablet 2 03/14/2016 at Unknown time  . temazepam (RESTORIL) 30 MG capsule Take 1 capsule by mouth at bedtime as needed for sleep.    Past Week at Unknown time  . TRANSDERM-SCOP 1.5 MG USE AS DIRECTED 4 patch 0 2016 at Unknown time  Treatment Modalities: Medication Management, Group therapy, Case management,  1 to 1 session with clinician, Psychoeducation, Recreational therapy.  Patient Stressors: Health problems Loss of Mother 7 years ago on 03/13/16 and loss of brother and sister to suicide 2 years ago (it was also 2 weeks apart)  Patient Strengths: Capable of independent living MetallurgistCommunication skills Financial means General fund of knowledge Motivation for treatment/growth Supportive family/friends  Physician Treatment Plan for Primary Diagnosis: MDD (major depressive disorder), recurrent episode, severe (HCC) Long Term Goal(s): Improvement in symptoms so as ready for discharge  Short Term Goals: Ability to identify changes in lifestyle to reduce recurrence of condition will improve Ability to disclose and discuss suicidal  ideas Ability to identify and develop effective coping behaviors will improve Ability to disclose and discuss suicidal ideas Ability to identify and develop effective coping behaviors will improve Compliance with prescribed medications will improve  Medication Management: Evaluate patient's response, side effects, and tolerance of medication regimen.  Therapeutic Interventions: 1 to 1 sessions, Unit Group sessions and Medication administration.  Evaluation of Outcomes: Adequate for Discharge  Physician Treatment Plan for Secondary Diagnosis: Principal Problem:   MDD (major depressive disorder), recurrent episode, severe ([facility])   Long Term Goal(s): Improvement in symptoms so as ready for discharge  Short Term Goals: Ability to identify changes in lifestyle to reduce recurrence of condition will improve Ability to disclose and discuss suicidal ideas Ability to identify and develop effective coping behaviors will improve Ability to disclose and discuss suicidal ideas Ability to identify and develop effective coping behaviors will improve Compliance with prescribed medications will improve  Medication Management: Evaluate patient's response, side effects, and tolerance of medication regimen.  Therapeutic Interventions: 1 to 1 sessions, Unit Group sessions and Medication administration.  Evaluation of Outcomes: Adequate for Discharge   RN Treatment Plan for Primary Diagnosis: MDD (major depressive disorder), recurrent episode, severe (HCC) Long Term Goal(s): Knowledge of disease and therapeutic regimen to maintain health will improve  Short Term Goals: Ability to verbalize feelings will improve, Ability to disclose and discuss suicidal ideas and Ability to identify and develop effective coping behaviors will improve  Medication Management: RN will administer medications as ordered by provider, will assess and evaluate patient's response and provide education to patient for prescribed  medication. RN will report any adverse and/or side effects to prescribing provider.  Therapeutic Interventions: 1 on 1 counseling sessions, Psychoeducation, Medication administration, Evaluate responses to treatment, Monitor vital signs and CBGs as ordered, Perform/monitor CIWA, COWS, AIMS and Fall Risk screenings as ordered, Perform wound care treatments as ordered.  Evaluation of Outcomes: Adequate for Discharge   LCSW Treatment Plan for Primary Diagnosis: MDD (major depressive disorder), recurrent episode, severe (HCC) Long Term Goal(s): Safe transition to appropriate next level of care at discharge, Engage patient in therapeutic group addressing interpersonal concerns.  Short Term Goals: Engage patient in aftercare planning with referrals and resources and Increase skills for wellness and recovery  Therapeutic Interventions: Assess for all discharge needs, 1 to 1 time with Social worker, Explore available resources and support systems, Assess for adequacy in community support network, Educate family and significant other(s) on suicide prevention, Complete Psychosocial Assessment, Interpersonal group therapy.  Evaluation of Outcomes: Adequate for Discharge   Progress in Treatment: Attending groups: Yes Participating in groups: Yes Taking medication as prescribed: Yes, MD continues to assess for medication changes as needed Toleration medication: Yes, no side effects reported at this time Family/Significant other contact made: No, CSW attempting to make contact with  friend Patient understands diagnosis: Continuing to assess Discussing patient identified problems/goals with staff: Yes Medical problems stabilized or resolved: Yes Denies suicidal/homicidal ideation: Yes Issues/concerns per patient self-inventory: None Other: N/A  New problem(s) identified: None identified at this time.   New Short Term/Long Term Goal(s): None identified at this time.   Discharge Plan or Barriers: Pt  will return home and follow-up with outpatient services.   Reason for Continuation of Hospitalization: Anxiety Depression Medication stabilization  Estimated Length of Stay: 1 day  Attendees: Patient: 03/18/2016  11:15 AM  Physician: Dr. Jama Flavorsobos 03/18/2016  11:15 AM  Nursing: Quintella ReichertBeverly Knight, RN; Anson FretLaronica Waller, RN 03/18/2016  11:15 AM  RN Care Manager: Onnie BoerJennifer Clark, RN 03/18/2016  11:15 AM  Social Worker: Vernie ShanksLauren Alysandra Lobue, LCSW; Belenda CruiseKristin Drinkard, LCSW 03/18/2016  11:15 AM  Recreational Therapist:  03/18/2016  11:15 AM  Geannie Risenther:Tanika Melvyn NethLewis, NP; Gray BernhardtMay Augustin, NP 03/18/2016  11:15 AM  Other:  03/18/2016  11:15 AM  Other: 03/18/2016  11:15 AM    Scribe for Treatment Team: Verdene LennertLauren C Caliegh Middlekauff, LCSW 03/18/2016 11:15 AM

## 2016-03-18 NOTE — Progress Notes (Signed)
Adult Psychoeducational Group Note  Date:  03/18/2016 Time:  11:52 AM  Group Topic/Focus:  Conflict Resolution:   The focus of this group is to discuss the conflict resolution process and how it may be used upon discharge.   Participation Level:  Active  Participation Quality:  Appropriate  Affect:  Appropriate  Cognitive:  Appropriate  Insight: Appropriate, Good and Improving  Engagement in Group:  Engaged  Modes of Intervention:  Discussion  Additional Comments:  Pt participated in group thi morning.  She said that on a scale from one to ten she feels like a ten and states that she feels like she can cope with her issues better. Adonna Horsley R Ryhanna Dunsmore 03/18/2016, 11:52 AM

## 2016-03-18 NOTE — Progress Notes (Signed)
Recreation Therapy Notes  Date: 03/18/16 Time: 0930 Location: [address] Dayroom  Group Topic: Stress Management  Goal Area(s) Addresses:  Patient will verbalize importance of using healthy stress management.  Patient will identify positive emotions associated with healthy stress management.   Behavioral Response: Engaged  Intervention: Calm App  Activity :  LRT introduced the stress management concept of meditation.  LRT played Obrien meditation on anxiety from the Calm App.  Patients were to follow along with the app to engage in the meditation.  Education:  Stress Management, Discharge Planning.   Education Outcome: Acknowledges edcuation/In group clarification offered/Needs additional education  Clinical Observations/Feedback: Pt attended group.   Brenda Obrien, LRT/CTRS         Brenda Obrien 03/18/2016 11:39 AM 

## 2016-03-18 NOTE — Progress Notes (Signed)
Patient has been up in the dayroom and attended group this evening. She is hopeful to discharge so that she can have her surgery on 12/6. She experiences quite a bit of pain but tolerates it well. She reports that she doesn't like surgeries but this is one surgery she is looking forward to. Patient currently denies si/hi/a/v hall. Support and encouragement offered, safety maintained on unit, will continue to monitor.

## 2016-03-18 NOTE — [facility] Group Notes (Signed)
[facility] LCSW Group Therapy  03/18/2016 1:15pm  Type of Therapy:  Group Therapy vercoming Obstacles  Participation Level:  Active  Participation Quality:  Appropriate   Affect:  Appropriate  Cognitive:  Appropriate and Oriented  Insight:  Developing/Improving and Improving  Engagement in Therapy:  Improving  Modes of Intervention:  Discussion, Exploration, Problem-solving and Support  Description of Group:   In this group patients will be encouraged to explore what they see as obstacles to their own wellness and recovery. They will be guided to discuss their thoughts, feelings, and behaviors related to these obstacles. The group will process together ways to cope with barriers, with attention given to specific choices patients can make. Each patient will be challenged to identify changes they are motivated to make in order to overcome their obstacles. This group will be process-oriented, with patients participating in exploration of their own experiences as well as giving and receiving support and challenge from other group members.  Summary of Patient Progress: Pt expressed that she feels that her pride and mental health were serving as an obstacle prior to this admission. However, Pt reports having an improved perspective and feeling thankful for the opportunity to receive help. Pt reports that having a good support system is imperative to a successful recovery.    Therapeutic Modalities:   Cognitive Behavioral Therapy Solution Focused Therapy Motivational Interviewing Relapse Prevention Therapy   Brenda ShanksLauren Nyzir Dubois, LCSW 03/18/2016 4:16 PM

## 2016-03-19 MED ORDER — BUPROPION HCL ER (XL) 150 MG PO TB24: 150.0000 mg | ORAL_TABLET | Freq: Every day | ORAL | 0 refills | Status: DC

## 2016-03-19 MED ORDER — LISINOPRIL 20 MG PO TABS: 20.0000 mg | ORAL_TABLET | Freq: Every day | ORAL | 0 refills | Status: DC

## 2016-03-19 MED ORDER — TRAZODONE HCL 50 MG PO TABS: 50.0000 mg | ORAL_TABLET | Freq: Every evening | ORAL | 0 refills | Status: DC | PRN

## 2016-03-19 MED ORDER — SERTRALINE HCL 50 MG PO TABS: 50.0000 mg | ORAL_TABLET | Freq: Every day | ORAL | 0 refills | Status: DC

## 2016-03-19 MED ORDER — LORAZEPAM 0.5 MG PO TABS: 0.5000 mg | ORAL_TABLET | Freq: Four times a day (QID) | ORAL | 0 refills | Status: DC | PRN

## 2016-03-19 NOTE — Discharge Summary (Signed)
Physician Discharge Summary Note  Patient:  Brenda CroftsDonna L Obrien is an 50 y.o., female MRN:  [id number] DOB:  01/12/1966 Patient phone:  [phone] (home)  Patient address:   [address] [place] [id number],  Total Time spent with patient: 30 minutes  Date of Admission:  03/15/2016 Date of Discharge: 03/19/2016  Reason for Admission:  Depression, anxiety  Principal Problem: MDD (major depressive disorder), recurrent episode, severe [facility](HCC) Discharge Diagnoses: Patient Active Problem List   Diagnosis Date Noted  . MDD (major depressive disorder), recurrent severe, without psychosis (HCC) [F33.2] 03/15/2016  . MDD (major depressive disorder), recurrent episode, severe (HCC) [F33.2] 03/15/2016  . Insomnia [G47.00] 12/27/2015  . Well woman exam [Z01.419] 09/28/2014  . Closed fracture of patella [S82.009A] 04/28/2014  . Obesity, unspecified [E66.9] 09/20/2013  . Carpal tunnel syndrome [G56.00] 09/20/2013  . Allergic rhinitis [J30.9] 09/20/2013  . Anxiety [F41.9] 03/13/2012  . Hyperglycemia [R73.9] 02/03/2012  . Hypertension [I10] 01/21/2012  . Anterior cruciate ligament complete tear [S83.519A] 07/01/2011  . Dyslipidemia [E78.5] 11/22/2010  . GERD [K21.9] 03/27/2009    Past Psychiatric History: see HPI  Past Medical History:  Past Medical History:  Diagnosis Date  . Asthma   . Cataract   . Depression   . Elevated cholesterol   . Endometriosis   . GERD (gastroesophageal reflux disease)   . Glaucoma   . 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 Hypertension     Past Surgical History:  Procedure Laterality Date  . ANTERIOR CRUCIATE LIGAMENT REPAIR  1987,2001,2003,2005  . CATARACT EXTRACTION Bilateral 2016  . [place]  . LAPAROSCOPIC TOTAL HYSTERECTOMY  6/11  . left knee surgery  09/2011  . NASAL SINUS SURGERY  1992  . REPLACEMENT TOTAL KNEE Left 9/14   Dr. Elliot Dallyavid Martin   Family History:  Family History  Problem Relation Age of Onset  .  Coronary artery disease Mother     PAD  . Heart attack Mother 8150  . Diabetes Mother   . Lung cancer Father   . Heart attack Father 30  . Heart attack Brother 45  . Hepatitis Brother     Hep B  . Bipolar disorder Sister    Family Psychiatric  History: see HPI Social History:  History  Alcohol Use  . 7.2 oz/week  . 12 Cans of beer per week    Comment: Socially     History  Drug Use No    Social History   Social History  . Marital status: Single    Spouse name: N/A  . Number of children: N/A  . Years of education: N/A   Occupational History  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 Smoking status: Current Every Day Smoker    Packs/day: 1.00    Types: Cigarettes    Last attempt to quit: 04/15/2004  . Smokeless tobacco: Never Used  . Alcohol use 7.2 oz/week    12 Cans of beer per week     Comment: Socially  . Drug use: No  . Sexual activity: Yes    Birth control/ protection: Surgical     Comment: hysterectomy   Other Topics Concern  . 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  She had a previous shoulder surgery and did not want to undergo the same procedure and was depressed by it.  She was also overwhelmed with other life stressors and developed suicidal ideations.    Brenda Obrien was admitted for MDD (major depressive disorder), recurrent episode, severe (HCC) and crisis management.  Patient was treated with medications with their indications listed below in detail under Medication List.  Medical problems were identified and treated as needed.  Home medications were restarted as appropriate.  Improvement was monitored by observation and Brenda Obrien daily report of symptom  reduction.  Emotional and mental status was monitored by daily self inventory reports completed by Brenda Obrien and clinical staff.  Patient reported continued improvement, denied any new concerns.  Patient had been compliant on medications and denied side effects. Support and encouragement was provided.    Brenda Obrien was evaluated by the treatment team for stability and plans for continued recovery upon discharge.  Patient was offered further treatment options upon discharge including Residential, Intensive Outpatient and Outpatient treatment. Patient will follow up with agency listed below for medication management and counseling.  Encouraged patient to maintain satisfactory support network and home environment.  Advised to adhere to medication compliance and outpatient treatment follow up.  Prescriptions provided.       Brenda Obrien motivation was an integral factor for scheduling further treatment.  Employment, transportation, bed availability, health status, family support, and any pending legal issues were also considered during patient's hospital stay.  Upon completion of this admission the patient was both mentally and medically stable for discharge denying suicidal/homicidal ideation, auditory/visual/tactile hallucinations, delusional thoughts and paranoia.      Physical Findings: AIMS: Facial and Oral Movements Muscles of Facial Expression: None, normal Lips and Perioral Area: None, normal Jaw: None, normal Tongue: None, normal,Extremity Movements Upper (arms, wrists, hands, fingers): None, normal Lower (legs, knees, ankles, toes): None, normal, Trunk Movements Neck, shoulders, hips: None, normal, Overall Severity Severity of abnormal movements (highest score from questions above): None, normal Incapacitation due to abnormal movements: None, normal Patient's awareness of abnormal movements (rate only patient's report): No Awareness, Dental Status Current problems with teeth and/or  dentures?: No Does patient usually wear dentures?: No  CIWA:    COWS:     Musculoskeletal: Strength & Muscle Tone: within normal limits Gait & Station: normal Patient leans: N/A  Psychiatric Specialty Exam:  SEE MD SRA Physical Exam  ROS  Blood pressure 129/77, pulse 72, temperature 98.2 F (36.8 C), temperature source Oral, resp. rate 16, height 5\' 6"  (1.676 m), weight 99.8 kg (220 lb), SpO2 97 %.Body mass index is 35.51 kg/m.    Have you used any form of tobacco in the last 30 days? (Cigarettes, Smokeless Tobacco, Cigars, and/or Pipes): Yes  Has this patient used any form of tobacco in the last 30 days? (Cigarettes, Smokeless Tobacco, Cigars, and/or Pipes) Yes, N/A  Blood Alcohol level:  Lab Results  Component Value Date   ETH 187 (H) 03/14/2016    Metabolic Disorder Labs:  Lab Results  Component Value Date   HGBA1C 5.4 04/28/2012   No results found for: PROLACTIN Lab Results  Component Value Date   CHOL 211 (H) 12/27/2015   TRIG 83.0 12/27/2015   HDL 63.30 12/27/2015   CHOLHDL 3 12/27/2015   VLDL 16.6 12/27/2015   LDLCALC 131 (H) 12/27/2015  LDLCALC 139 (H) 09/28/2014    See Psychiatric Specialty Exam and Suicide Risk Assessment completed by Attending Physician prior to discharge.  Discharge destination:  Home  Is patient on multiple antipsychotic therapies at discharge:  No   Has Patient had three or more failed trials of antipsychotic monotherapy by history:  No  Recommended Plan for Multiple Antipsychotic Therapies: NA     Medication List    STOP taking these medications   albuterol 108 (90 Base) MCG/ACT inhaler Commonly known as:  PROVENTIL HFA;VENTOLIN HFA   hydrocortisone 2.5 % rectal cream Commonly known as:  ANUSOL-HC   multivitamin tablet   omeprazole 40 MG capsule Commonly known as:  PRILOSEC   simvastatin 20 MG tablet Commonly known as:  ZOCOR   temazepam 30 MG capsule Commonly known as:  RESTORIL   TRANSDERM-SCOP (1.5 MG) 1  MG/3DAYS Generic drug:  scopolamine     TAKE these medications     Indication  buPROPion 150 MG 24 hr tablet Commonly known as:  WELLBUTRIN XL Take 1 tablet (150 mg total) by mouth daily. Start taking on:  03/20/2016 What changed:  See the new instructions.  Indication:  Major Depressive Disorder   lisinopril 20 MG tablet Commonly known as:  PRINIVIL,ZESTRIL Take 1 tablet (20 mg total) by mouth daily. Start taking on:  03/20/2016  Indication:  High Blood Pressure Disorder   LORazepam 0.5 MG tablet Commonly known as:  ATIVAN Take 1 tablet (0.5 mg total) by mouth every 6 (six) hours as needed for anxiety. What changed:  how much to take  how to take this  when to take this  reasons to take this  additional instructions  Indication:  Anxiousness associated with Depression   sertraline 50 MG tablet Commonly known as:  ZOLOFT Take 1 tablet (50 mg total) by mouth daily. Start taking on:  03/20/2016 What changed:  See the new instructions.  Indication:  Major Depressive Disorder   traZODone 50 MG tablet Commonly known as:  DESYREL Take 1 tablet (50 mg total) by mouth at bedtime as needed for sleep.  Indication:  Trouble Sleeping      Follow-up Information    [facility]. Go on 03/20/2016.   Specialty:  Behavioral Health Why:  at 12:00pm with Lurena Joinerebecca for therapy. Please schedule your appointment with Dr. Donell BeersPlovsky for medication management at this appointment.  Contact information: [address] [place] [address] [phone]           Follow-up recommendations:  Activity:  as tol Diet:  as tol  Comments:  1.  Take all your medications as prescribed.   2.  Report any adverse side effects to outpatient provider. 3.  Patient instructed to not use alcohol or illegal drugs while on prescription medicines. 4.  In the event of worsening symptoms, instructed patient to call 911, the crisis hotline or go to nearest emergency  room for evaluation of symptoms.  Signed: Lindwood QuaSheila May Agustin, NP [facility] 03/19/2016, 9:56 AM   Patient seen, Suicide Assessment Completed.  Disposition Plan Reviewed

## 2016-03-19 NOTE — Progress Notes (Signed)
Adult Psychoeducational Group Note  Date:  03/19/2016 Time:  1:10 AM  Group Topic/Focus:  Wrap-Up Group:   The focus of this group is to help patients review their daily goal of treatment and discuss progress on daily workbooks.   Participation Level:  Active  Participation Quality:  Appropriate  Affect:  Appropriate  Cognitive:  Appropriate  Insight: Appropriate  Engagement in Group:  Engaged  Modes of Intervention:  Discussion  Additional Comments: Patient states, "I had a great day". Patient's goal for today was to plan for discharge. Latrena Benegas L [address] 03/19/2016, 1:10 AM

## 2016-03-19 NOTE — BHH Suicide Risk Assessment (Signed)
[facility]   Principal Problem: MDD (major depressive disorder), recurrent episode, severe (HCC) Discharge Diagnoses:  Patient Active Problem List   Diagnosis Date Noted  . MDD (major depressive disorder), recurrent severe, without psychosis (HCC) [F33.2] 03/15/2016  . MDD (major depressive disorder), recurrent episode, severe (HCC) [F33.2] 03/15/2016  . Insomnia [G47.00] 12/27/2015  . Well woman exam [Z01.419] 09/28/2014  . Closed fracture of patella [S82.009A] 04/28/2014  . Obesity, unspecified [E66.9] 09/20/2013  . Carpal tunnel syndrome [G56.00] 09/20/2013  . Allergic rhinitis [J30.9] 09/20/2013  . Anxiety [F41.9] 03/13/2012  . Hyperglycemia [R73.9] 02/03/2012  . Hypertension [I10] 01/21/2012  . Anterior cruciate ligament complete tear [S83.519A] 07/01/2011  . Dyslipidemia [E78.5] 11/22/2010  . GERD [K21.9] 03/27/2009    Total Time spent with patient: 30 minutes  Musculoskeletal: Strength & Muscle Tone: within normal limits Gait & Station: normal Patient leans: N/A  Psychiatric Specialty Exam: ROS denies headache, no chest pain, no shortness of breath, no nausea, no vomiting, chronic R shoulder pain and movement restriction  Blood pressure 129/77, pulse 72, temperature 98.2 F (36.8 C), temperature source Oral, resp. rate 16, height 5\' 6"  (1.676 m), weight 220 lb (99.8 kg), SpO2 97 %.Body mass index is 35.51 kg/m.  General Appearance: Well Groomed  Eye Contact::  Good  Speech:  Normal Rate409  Volume:  Normal  Mood:  reports mood is " positive " today, denies depression  Affect:  Appropriate  Thought Process:  Linear  Orientation:  Full (Time, Place, and Person)  Thought Content:  denies hallucinations, no delusions , not internally preoccupied   Suicidal Thoughts:  No denies any suicidal or self injurious ideations , denies any homicidal or violent ideations  Homicidal Thoughts:  No  Memory:  recent and remote grossly intact   Judgement:   Other:  improved   Insight:  improved   Psychomotor Activity:  Normal  Concentration:  Good  Recall:  Good  Fund of Knowledge:Good  Language: Good  Akathisia:  Negative  Handed:  Right  AIMS (if indicated):     Assets:  Desire for Improvement Resilience  Sleep:  Number of Hours: 5  Cognition: WNL  ADL's:  Intact   Mental Status Per Nursing Assessment::   On Admission:  NA  Demographic Factors:  50 year old female , single , lives alone   Loss Factors: Shoulder pain and movement restriction, which are limiting her daily function -  difficulty with work,  ADLs, driving.  Historical Factors: History of depression  Risk Reduction Factors:   Employed, Positive social support and Positive coping skills or problem solving skills  Continued Clinical Symptoms:  At this time patient is improved compared to admission. Presents alert, attentive, well related, mood improved and currently denies depression, presents euthymic, affect appropriate, fuller in range, no thought disorder, no suicidal or self injurious ideations, no hallucinations, no delusions, future oriented, states she is looking forward to her elective shoulder surgery which is scheduled for later this week, and making recovery that will enable her to return to work. She denies medication side effects. Behavior on unit in good spirits Of note, LFTS have been persistently elevated. She denies any associated symptoms. Hep C/B serologies ordered , currently pending. Patient plans to follow up with PCP for further monitoring, management if needed   Cognitive Features That Contribute To Risk:  No gross cognitive deficits noted upon discharge. Is alert , attentive, and oriented x 3   Suicide Risk:  Mild:  Suicidal ideation  of limited frequency, intensity, duration, and specificity.  There are no identifiable plans, no associated intent, mild dysphoria and related symptoms, good self-control (both objective and subjective  assessment), few other risk factors, and identifiable protective factors, including available and accessible social support.  Follow-up Information    [facility]. Go on 03/20/2016.   Specialty:  Behavioral Health Why:  at 12:00pm with Lurena Joinerebecca for therapy. Please schedule your appointment with Dr. Donell BeersPlovsky for medication management at this appointment.  Contact information: [address] [place] [phone]           Plan Of Care/Follow-up recommendations:  Activity:  as tolerated  Diet:  heart healthy Tests:  NA Other:  see below  Patient is leaving unit in good spirits  Plans to return home Follow up as above  As above, scheduled for elective shoulder surgery later this week.  Nehemiah MassedOBOS, Ludell Zacarias, MD 03/19/2016, 9:01 AM

## 2016-03-19 NOTE — Progress Notes (Signed)
Discharge note:  Patient discharged home per MD order.  Patient will follow up with [facility] today at 1200.  Patient is scheduled to have shoulder surgery tomorrow.  Patient denies any thoughts of self harm.  Patient did not have any belongings in a locker; she received all belongings from her room.  Patient left ambulatory with a friend.

## 2016-03-19 NOTE — Progress Notes (Addendum)
  [facility] Discharge [facility] :  Will you be returning to the same living situation after discharge:  Yes,  Pt returning home At discharge, do you have transportation home?: Yes,  Pt friend to pick up Do you have the ability to pay for your medications: Yes,  Pt provided with prescriptions  Release of information consent forms completed and in the chart;  Patient's signature needed at discharge.  Patient to Follow up at: Follow-up Information    [facility]. Go on 03/19/2016.   Specialty:  Behavioral Health Why:  at 12:00pm with Lurena Joinerebecca for therapy. Please schedule your appointment with Dr. Donell BeersPlovsky for medication management at this appointment.  Contact information: [address] [place] [address] [phone]           Next level of care provider has access to [facility] Link:no  Safety Planning and Suicide Prevention discussed: Yes,  with friend; see SPE note  Have you used any form of tobacco in the last 30 days? (Cigarettes, Smokeless Tobacco, Cigars, and/or Pipes): Yes  Has patient been referred to the Quitline?: Patient refused referral  Patient has been referred for addiction treatment: N/A  Verdene LennertLauren C Caylynn Minchew 03/19/2016, 9:42 AM

## 2016-03-19 NOTE — Progress Notes (Signed)
Pt reports she has had a wonderful day.  She states she has made some good friends while here and feels supported her as well as at home.  She denies SI/HI/AVH.  She says that the pain medication is helping her shoulder pain tolerable.  She says that she is supposed to have shoulder surgery later this week and will probably discharge tomorrow. Pt makes her needs known to staff.  She is pleasant and cooperative.  Support and encouragement offered.  Discharge plans are in process.  Safety maintained with q15 minute checks.

## 2016-03-19 NOTE — BHH Group Notes (Signed)
Orientation Group:  Patient invited; declined to attend.

## 2016-03-20 HISTORY — PX: SHOULDER SURGERY: SHX246

## 2016-03-20 LAB — HEPATITIS B SURFACE ANTIGEN: HEP B S AG: NEGATIVE

## 2016-03-20 LAB — HEPATITIS C ANTIBODY

## 2016-04-17 DIAGNOSIS — M75111 Incomplete rotator cuff tear or rupture of right shoulder, not specified as traumatic: Secondary | ICD-10-CM | POA: Diagnosis not present

## 2016-04-17 DIAGNOSIS — M25511 Pain in right shoulder: Secondary | ICD-10-CM | POA: Diagnosis not present

## 2016-04-17 DIAGNOSIS — M25611 Stiffness of right shoulder, not elsewhere classified: Secondary | ICD-10-CM | POA: Diagnosis not present

## 2016-04-19 DIAGNOSIS — M25611 Stiffness of right shoulder, not elsewhere classified: Secondary | ICD-10-CM | POA: Diagnosis not present

## 2016-04-19 DIAGNOSIS — M75111 Incomplete rotator cuff tear or rupture of right shoulder, not specified as traumatic: Secondary | ICD-10-CM | POA: Diagnosis not present

## 2016-04-19 DIAGNOSIS — M25511 Pain in right shoulder: Secondary | ICD-10-CM | POA: Diagnosis not present

## 2016-04-22 DIAGNOSIS — M25611 Stiffness of right shoulder, not elsewhere classified: Secondary | ICD-10-CM | POA: Diagnosis not present

## 2016-04-22 DIAGNOSIS — M25511 Pain in right shoulder: Secondary | ICD-10-CM | POA: Diagnosis not present

## 2016-04-22 DIAGNOSIS — M75111 Incomplete rotator cuff tear or rupture of right shoulder, not specified as traumatic: Secondary | ICD-10-CM | POA: Diagnosis not present

## 2016-04-24 DIAGNOSIS — M25511 Pain in right shoulder: Secondary | ICD-10-CM | POA: Diagnosis not present

## 2016-04-24 DIAGNOSIS — M25611 Stiffness of right shoulder, not elsewhere classified: Secondary | ICD-10-CM | POA: Diagnosis not present

## 2016-04-24 DIAGNOSIS — M75111 Incomplete rotator cuff tear or rupture of right shoulder, not specified as traumatic: Secondary | ICD-10-CM | POA: Diagnosis not present

## 2016-04-26 DIAGNOSIS — M75111 Incomplete rotator cuff tear or rupture of right shoulder, not specified as traumatic: Secondary | ICD-10-CM | POA: Diagnosis not present

## 2016-04-26 DIAGNOSIS — M25511 Pain in right shoulder: Secondary | ICD-10-CM | POA: Diagnosis not present

## 2016-04-26 DIAGNOSIS — M25611 Stiffness of right shoulder, not elsewhere classified: Secondary | ICD-10-CM | POA: Diagnosis not present

## 2016-04-29 DIAGNOSIS — M75111 Incomplete rotator cuff tear or rupture of right shoulder, not specified as traumatic: Secondary | ICD-10-CM | POA: Diagnosis not present

## 2016-04-29 DIAGNOSIS — M25511 Pain in right shoulder: Secondary | ICD-10-CM | POA: Diagnosis not present

## 2016-04-29 DIAGNOSIS — M25611 Stiffness of right shoulder, not elsewhere classified: Secondary | ICD-10-CM | POA: Diagnosis not present

## 2016-05-03 DIAGNOSIS — M25611 Stiffness of right shoulder, not elsewhere classified: Secondary | ICD-10-CM | POA: Diagnosis not present

## 2016-05-03 DIAGNOSIS — M75111 Incomplete rotator cuff tear or rupture of right shoulder, not specified as traumatic: Secondary | ICD-10-CM | POA: Diagnosis not present

## 2016-05-03 DIAGNOSIS — M25511 Pain in right shoulder: Secondary | ICD-10-CM | POA: Diagnosis not present

## 2016-05-06 DIAGNOSIS — Z9889 Other specified postprocedural states: Secondary | ICD-10-CM | POA: Diagnosis not present

## 2016-05-06 DIAGNOSIS — M7712 Lateral epicondylitis, left elbow: Secondary | ICD-10-CM | POA: Diagnosis not present

## 2016-05-08 DIAGNOSIS — M25611 Stiffness of right shoulder, not elsewhere classified: Secondary | ICD-10-CM | POA: Diagnosis not present

## 2016-05-08 DIAGNOSIS — M75111 Incomplete rotator cuff tear or rupture of right shoulder, not specified as traumatic: Secondary | ICD-10-CM | POA: Diagnosis not present

## 2016-05-08 DIAGNOSIS — M25511 Pain in right shoulder: Secondary | ICD-10-CM | POA: Diagnosis not present

## 2016-05-10 DIAGNOSIS — M75111 Incomplete rotator cuff tear or rupture of right shoulder, not specified as traumatic: Secondary | ICD-10-CM | POA: Diagnosis not present

## 2016-05-10 DIAGNOSIS — M25611 Stiffness of right shoulder, not elsewhere classified: Secondary | ICD-10-CM | POA: Diagnosis not present

## 2016-05-10 DIAGNOSIS — M25511 Pain in right shoulder: Secondary | ICD-10-CM | POA: Diagnosis not present

## 2016-05-17 DIAGNOSIS — M75111 Incomplete rotator cuff tear or rupture of right shoulder, not specified as traumatic: Secondary | ICD-10-CM | POA: Diagnosis not present

## 2016-05-17 DIAGNOSIS — M25611 Stiffness of right shoulder, not elsewhere classified: Secondary | ICD-10-CM | POA: Diagnosis not present

## 2016-05-17 DIAGNOSIS — M25511 Pain in right shoulder: Secondary | ICD-10-CM | POA: Diagnosis not present

## 2016-05-20 DIAGNOSIS — M25611 Stiffness of right shoulder, not elsewhere classified: Secondary | ICD-10-CM | POA: Diagnosis not present

## 2016-05-20 DIAGNOSIS — M25511 Pain in right shoulder: Secondary | ICD-10-CM | POA: Diagnosis not present

## 2016-05-20 DIAGNOSIS — M75111 Incomplete rotator cuff tear or rupture of right shoulder, not specified as traumatic: Secondary | ICD-10-CM | POA: Diagnosis not present

## 2016-05-22 DIAGNOSIS — M25511 Pain in right shoulder: Secondary | ICD-10-CM | POA: Diagnosis not present

## 2016-05-22 DIAGNOSIS — M25611 Stiffness of right shoulder, not elsewhere classified: Secondary | ICD-10-CM | POA: Diagnosis not present

## 2016-05-22 DIAGNOSIS — M75111 Incomplete rotator cuff tear or rupture of right shoulder, not specified as traumatic: Secondary | ICD-10-CM | POA: Diagnosis not present

## 2016-05-24 DIAGNOSIS — M25611 Stiffness of right shoulder, not elsewhere classified: Secondary | ICD-10-CM | POA: Diagnosis not present

## 2016-05-24 DIAGNOSIS — M75111 Incomplete rotator cuff tear or rupture of right shoulder, not specified as traumatic: Secondary | ICD-10-CM | POA: Diagnosis not present

## 2016-05-24 DIAGNOSIS — M25511 Pain in right shoulder: Secondary | ICD-10-CM | POA: Diagnosis not present

## 2016-05-27 DIAGNOSIS — M25511 Pain in right shoulder: Secondary | ICD-10-CM | POA: Diagnosis not present

## 2016-05-27 DIAGNOSIS — M75111 Incomplete rotator cuff tear or rupture of right shoulder, not specified as traumatic: Secondary | ICD-10-CM | POA: Diagnosis not present

## 2016-05-27 DIAGNOSIS — M25611 Stiffness of right shoulder, not elsewhere classified: Secondary | ICD-10-CM | POA: Diagnosis not present

## 2016-06-07 DIAGNOSIS — M25611 Stiffness of right shoulder, not elsewhere classified: Secondary | ICD-10-CM | POA: Diagnosis not present

## 2016-06-07 DIAGNOSIS — M75111 Incomplete rotator cuff tear or rupture of right shoulder, not specified as traumatic: Secondary | ICD-10-CM | POA: Diagnosis not present

## 2016-06-07 DIAGNOSIS — M25511 Pain in right shoulder: Secondary | ICD-10-CM | POA: Diagnosis not present

## 2016-06-11 ENCOUNTER — Ambulatory Visit (INDEPENDENT_AMBULATORY_CARE_PROVIDER_SITE_OTHER): Payer: 59 | Admitting: Family Medicine

## 2016-06-11 ENCOUNTER — Encounter: Payer: Self-pay | Admitting: Family Medicine

## 2016-06-11 VITALS — BP 144/95 | HR 105 | Temp 98.6°F | Ht 66.0 in | Wt 229.0 lb

## 2016-06-11 DIAGNOSIS — J018 Other acute sinusitis: Secondary | ICD-10-CM | POA: Diagnosis not present

## 2016-06-11 MED ORDER — AZITHROMYCIN 250 MG PO TABS: ORAL_TABLET | ORAL | 0 refills | Status: DC

## 2016-06-11 NOTE — Patient Instructions (Signed)
**  Coming March 12th**  [facility]!!!  Same Day Appointments for Acute Care: Sprains, Injuries, cuts, abrasions Colds, flu, sore throats, cough, upset stomachs Fever, ear pain Sinus and eye infections Animal/insect bites  3 Easy Ways to Schedule: Walk-In Scheduling Call in scheduling Mychart Sign-up: [email or web address]         

## 2016-06-11 NOTE — Progress Notes (Signed)
   Subjective:    Patient ID: Brenda Obrien, female    DOB: 02/20/1966, 51 y.o.   MRN: [id number]  HPI Here for 3 days of sinus pressure, headaches, PND, and coughing up yellow sputum. No fever.    Review of Systems  Constitutional: Negative.   HENT: Positive for congestion, postnasal drip, sinus pain and sinus pressure. Negative for sore throat.   Eyes: Negative.   Respiratory: Positive for cough.        Objective:   Physical Exam  Constitutional: She appears well-developed and well-nourished.  HENT:  Right Ear: External ear normal.  Left Ear: External ear normal.  Nose: Nose normal.  Mouth/Throat: Oropharynx is clear and moist.  Eyes: Conjunctivae are normal.  Neck: No thyromegaly present.  Pulmonary/Chest: Effort normal and breath sounds normal.  Lymphadenopathy:    She has no cervical adenopathy.          Assessment & Plan:  Sinusitis, treat with a Zpack. Add Mucinex.  Gershon CraneStephen Fry, MD

## 2016-06-11 NOTE — Progress Notes (Signed)
Pre visit review using our clinic review tool, if applicable. No additional management support is needed unless otherwise documented below in the visit note. 

## 2016-06-14 DIAGNOSIS — M75111 Incomplete rotator cuff tear or rupture of right shoulder, not specified as traumatic: Secondary | ICD-10-CM | POA: Diagnosis not present

## 2016-06-14 DIAGNOSIS — M25611 Stiffness of right shoulder, not elsewhere classified: Secondary | ICD-10-CM | POA: Diagnosis not present

## 2016-06-14 DIAGNOSIS — M25511 Pain in right shoulder: Secondary | ICD-10-CM | POA: Diagnosis not present

## 2016-06-17 ENCOUNTER — Telehealth: Payer: Self-pay | Admitting: Family Medicine

## 2016-06-17 NOTE — Telephone Encounter (Signed)
Pt state that she has completed the Zpac and is still taking the Mucinex and still has congestion in head and chest and would like to know if Dr. Clent RidgesFry would call her something in.  Pharm:  [facility] ([address]

## 2016-06-17 NOTE — Telephone Encounter (Signed)
Zpack is likely still in her system, although I did not see her for this complaint.  I would need to evaluate her myself for any additional treatment, unfortunately.

## 2016-06-17 NOTE — Telephone Encounter (Signed)
Spoke to pt. She finished the z-pac 06-15-16. Just concerned that it has moved to her chest. I will forward to Dr Clent RidgesFry

## 2016-06-17 NOTE — Telephone Encounter (Signed)
Call in Augmentin 875 bid for 10 days  

## 2016-06-18 MED ORDER — AMOXICILLIN-POT CLAVULANATE 875-125 MG PO TABS: 1.0000 | ORAL_TABLET | Freq: Two times a day (BID) | ORAL | 0 refills | Status: DC

## 2016-06-18 NOTE — Telephone Encounter (Signed)
I spoke with pt and [facility] 

## 2016-06-18 NOTE — Addendum Note (Signed)
Addended by: Aniceto BossNIMMONS, Lavanya Roa A on: 06/18/2016 12:23 PM   Modules accepted: Orders

## 2016-06-21 DIAGNOSIS — M25511 Pain in right shoulder: Secondary | ICD-10-CM | POA: Diagnosis not present

## 2016-06-21 DIAGNOSIS — M75111 Incomplete rotator cuff tear or rupture of right shoulder, not specified as traumatic: Secondary | ICD-10-CM | POA: Diagnosis not present

## 2016-06-21 DIAGNOSIS — M25611 Stiffness of right shoulder, not elsewhere classified: Secondary | ICD-10-CM | POA: Diagnosis not present

## 2016-06-28 DIAGNOSIS — M75111 Incomplete rotator cuff tear or rupture of right shoulder, not specified as traumatic: Secondary | ICD-10-CM | POA: Diagnosis not present

## 2016-06-28 DIAGNOSIS — M25611 Stiffness of right shoulder, not elsewhere classified: Secondary | ICD-10-CM | POA: Diagnosis not present

## 2016-06-28 DIAGNOSIS — M25511 Pain in right shoulder: Secondary | ICD-10-CM | POA: Diagnosis not present

## 2016-07-01 ENCOUNTER — Telehealth: Payer: Self-pay

## 2016-07-01 NOTE — Telephone Encounter (Signed)
Received a refill request form [facility] for omeprazole rx. It was stopped in the hospital last year and is not on her current med list. Left message for pt to call and verify if she is or is not taking the medication and needs the rx.

## 2016-07-02 MED ORDER — OMEPRAZOLE 40 MG PO CPDR: 40.0000 mg | DELAYED_RELEASE_CAPSULE | Freq: Every day | ORAL | 3 refills | Status: DC

## 2016-07-02 NOTE — Telephone Encounter (Signed)
Pt called back. She has not stopped taking the med. I will send the rx [facility].

## 2016-07-04 DIAGNOSIS — M7501 Adhesive capsulitis of right shoulder: Secondary | ICD-10-CM | POA: Diagnosis not present

## 2016-07-05 DIAGNOSIS — M25511 Pain in right shoulder: Secondary | ICD-10-CM | POA: Diagnosis not present

## 2016-07-05 DIAGNOSIS — M75111 Incomplete rotator cuff tear or rupture of right shoulder, not specified as traumatic: Secondary | ICD-10-CM | POA: Diagnosis not present

## 2016-07-05 DIAGNOSIS — M25611 Stiffness of right shoulder, not elsewhere classified: Secondary | ICD-10-CM | POA: Diagnosis not present

## 2016-09-12 ENCOUNTER — Ambulatory Visit: Payer: 59 | Admitting: Obstetrics & Gynecology

## 2016-09-17 ENCOUNTER — Encounter: Payer: Self-pay | Admitting: Obstetrics & Gynecology

## 2016-09-17 ENCOUNTER — Telehealth: Payer: Self-pay | Admitting: Obstetrics & Gynecology

## 2016-09-17 ENCOUNTER — Ambulatory Visit (INDEPENDENT_AMBULATORY_CARE_PROVIDER_SITE_OTHER): Payer: 59 | Admitting: Obstetrics & Gynecology

## 2016-09-17 VITALS — BP 126/66 | HR 98 | Resp 16 | Ht 66.0 in | Wt 241.0 lb

## 2016-09-17 DIAGNOSIS — Z01419 Encounter for gynecological examination (general) (routine) without abnormal findings: Secondary | ICD-10-CM

## 2016-09-17 MED ORDER — NYSTATIN 100000 UNIT/GM EX CREA: 1.0000 "application " | TOPICAL_CREAM | Freq: Two times a day (BID) | CUTANEOUS | 1 refills | Status: DC

## 2016-09-17 MED ORDER — MIRABEGRON ER 25 MG PO TB24: 25.0000 mg | ORAL_TABLET | Freq: Every day | ORAL | 2 refills | Status: DC

## 2016-09-17 NOTE — Telephone Encounter (Signed)
Patient [facility]

## 2016-09-17 NOTE — Telephone Encounter (Signed)
Call to [facility] to [facility] to be sent to. Spoke with patient, was [facility] [address]. Advised new order placed at requested pharmacy, previous order discontinued. Patient thankful for return call and verbalizes understanding.  Routing to provider for final review. Patient is agreeable to disposition. Will close encounter.

## 2016-09-17 NOTE — Progress Notes (Signed)
51 y.o. G0P0000 SingleCaucasianF here for annual exam.  Having issues with right shoulder and has had two shoulder sugeries since I saw her last.    State disability was approved about two weeks ago.  Working on her federal disability.  Working with a group that was recommended through her long term disability.    Denies vaginal bleeding.    Having some increased issues with bladder.  Having a lot of urgency.  When she has this she must go to the bathroom immediately.  If she doesn't make it to the bathroom she can leak a little or leak a lot.  Has never completely lost control.  Sleep is ok and she doesn't wake up most nights except the void once.  Does not leak at night.    No LMP recorded. Patient has had a hysterectomy.          Sexually active: No.  The current method of family planning is status post hysterectomy.    Exercising: No.  The patient does not participate in regular exercise at present. Smoker:  yes  Health Maintenance: Pap: 2010 Normal  History of abnormal Pap:  no MMG:  08/25/12 BIRADS1:Neg. Pt waiting for shoulder to heal first  Colonoscopy:  Never BMD:   Never TDaP:  02/2007  Pneumonia vaccine(s):  No Zostavax:   No Hep C testing: 03/18/16 Neg  Screening Labs: PCP   reports that she has been smoking Cigarettes.  She has been smoking about 1.00 pack per day. She has never used smokeless tobacco. She reports that she drinks about 7.2 oz of alcohol per week . She reports that she does not use drugs.  Past Medical History:  Diagnosis Date  . Asthma   . Cataract   . Depression   . Elevated cholesterol   . Endometriosis   . Generalized anxiety disorder 2017  . GERD (gastroesophageal reflux disease)   . Glaucoma   . 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 Hypertension   . PTSD (post-traumatic stress disorder) 2017    Past Surgical History:  Procedure Laterality Date  . ANTERIOR CRUCIATE LIGAMENT REPAIR  1987,2001,2003,2005  . CATARACT EXTRACTION Bilateral 2016  .  LAPAROSCOPIC CHOLECYSTECTOMY  5/14   [place], [place]  . LAPAROSCOPIC TOTAL HYSTERECTOMY  6/11  . left knee surgery  09/2011  . NASAL SINUS SURGERY  1992  . REPLACEMENT TOTAL KNEE Left 9/14   Dr. Elliot Dally  . SHOULDER SURGERY Right 09/08/2015   due to injury   . SHOULDER SURGERY Right 03/20/2016   due to injury    Current Outpatient Prescriptions  Medication Sig Dispense Refill  . Brexpiprazole 1 MG TABS Take 1 mg by mouth daily.    . busPIRone (BUSPAR) 10 MG tablet Take 10 mg by mouth 3 (three) times daily.    . DULoxetine (CYMBALTA) 60 MG capsule Take 90 mg by mouth daily.    Marland Kitchen gabapentin (NEURONTIN) 300 MG capsule Take 300 mg by mouth 3 (three) times daily.    . hydrOXYzine (VISTARIL) 25 MG capsule Take 25 mg by mouth as needed.    Marland Kitchen lisinopril (PRINIVIL,ZESTRIL) 20 MG tablet Take 1 tablet (20 mg total) by mouth daily. 30 tablet 0  . Naltrexone 380 MG SUSR Inject 380 mg into the muscle.    Marland Kitchen omeprazole (PRILOSEC) 40 MG capsule Take 1 capsule (40 mg total) by mouth daily. 90 capsule 3  . prazosin (MINIPRESS) 2 MG capsule Take 2 mg by mouth 2 (two) times daily.    Marland Kitchen  simvastatin (ZOCOR) 20 MG tablet Take 20 mg by mouth daily.    . traZODone (DESYREL) 50 MG tablet Take 1 tablet (50 mg total) by mouth at bedtime as needed for sleep. 30 tablet 0   No current facility-administered medications for this visit.     Family History  Problem Relation Age of Onset  . Coronary artery disease Mother        PAD  . Heart attack Mother [id number]  . Diabetes Mother   . Lung cancer Father   . Heart attack Father 30  . Heart attack Brother 45  . Hepatitis Brother        Hep B  . Bipolar disorder Sister     ROS:  Pertinent items are noted in HPI.  Otherwise, a comprehensive ROS was negative.  Exam:   BP 126/66 (BP Location: Right Arm, Patient Position: Sitting, Cuff Size: Large)   Pulse 98   Resp 16   Ht 5\' 6"  (1.676 m)   Wt 241 lb (109.3 kg)   BMI 38.90 kg/m   Weight change: +19#   Height: 5'  6" (167.6 cm)  Ht Readings from Last 3 Encounters:  09/17/16 5\' 6"  (1.676 m)  06/11/16 5\' 6"  (1.676 m)  03/14/16 5\' 6"  (1.676 m)    General appearance: alert, cooperative and appears stated age Head: Normocephalic, without obvious abnormality, atraumatic Neck: no adenopathy, supple, symmetrical, trachea midline and thyroid normal to inspection and palpation Lungs: clear to auscultation bilaterally Breasts: normal appearance, no masses or tenderness Heart: regular rate and rhythm Abdomen: soft, non-tender; bowel sounds normal; no masses,  no organomegaly Extremities: extremities normal, atraumatic, no cyanosis or edema Skin: Skin color, texture, turgor normal. No rashes or lesions Lymph nodes: Cervical, supraclavicular, and axillary nodes normal. No abnormal inguinal nodes palpated Neurologic: Grossly normal  Pelvic: External genitalia:  no lesions, erythema of inner thigh c/w yeast              Urethra:  normal appearing urethra with no masses, tenderness or lesions              Bartholins and Skenes: normal                 Vagina: normal appearing vagina with normal color and discharge, no lesions              Cervix: absent              Pap taken: No. Bimanual Exam:  Uterus:  uterus absent              Adnexa: no mass, fullness, tenderness               Rectovaginal: Confirms               Anus:  normal sphincter tone, no lesions  Chaperone was present for exam.  A:  Well Woman with normal exam H/O robotic assisted TLH H/O depression (and suicide attempt 11/17) Hypertension Elevated lipids H/O mildly elevated liver enzymes OAB Inner thigh candida  P:   Mammogram guidelines reviewed.  Aware due.  Has been unable to due this due to shoulder issues pap smear not indicated Rx for Nystatin cream bid up to 7 days to pharmacy Trial of myrbetriq 25mg .  Pt will be called to check on BP in about a month. She is going to wait until insurance changes occur. Aware tdap needs to be  updated by 11/18 return annually or prn

## 2016-09-25 ENCOUNTER — Telehealth: Payer: Self-pay

## 2016-09-25 NOTE — Telephone Encounter (Signed)
Fax from the pharmacy for Myrbetriq states:  " Alternative requested: Not covered by insurance. Insurance prefers MidwifeToviaz or call for PA."  Please advise.

## 2016-09-26 ENCOUNTER — Other Ambulatory Visit: Payer: Self-pay | Admitting: Obstetrics & Gynecology

## 2016-09-26 MED ORDER — FESOTERODINE FUMARATE ER 4 MG PO TB24: 4.0000 mg | ORAL_TABLET | Freq: Every day | ORAL | 1 refills | Status: DC

## 2016-09-26 NOTE — Telephone Encounter (Signed)
Spoke with patient. Advised of message as seen below from Dr.Miller. Patient is agreeable and verbalizes understanding.  Routing to provider for final review. Patient agreeable to disposition. Will close encounter   

## 2016-09-26 NOTE — Telephone Encounter (Signed)
Left message to call Kaitlyn at [phone]. 

## 2016-09-26 NOTE — Telephone Encounter (Signed)
Please let her know that insurance has Gala Murdochtoviaz as a preferred medication.  I sent in Rx for 4mg .  It comes in 4 and 8mg  but will start with the lower dosage.  She needs to pay attention to dry eyes, dry mouth or constipation symptoms and call with any issues.  I'd like an update in one month.  I did rx for #30/1RF and will do more RFs if this helps.  If not, will consider other options like the myrbetriq I initially prescribed.  Thanks.  Routing to triage to call.

## 2016-10-07 DIAGNOSIS — Z96652 Presence of left artificial knee joint: Secondary | ICD-10-CM | POA: Diagnosis not present

## 2016-10-07 DIAGNOSIS — Z96651 Presence of right artificial knee joint: Secondary | ICD-10-CM | POA: Diagnosis not present

## 2016-10-07 DIAGNOSIS — M1712 Unilateral primary osteoarthritis, left knee: Secondary | ICD-10-CM | POA: Diagnosis not present

## 2016-10-07 DIAGNOSIS — Z471 Aftercare following joint replacement surgery: Secondary | ICD-10-CM | POA: Diagnosis not present

## 2016-10-07 DIAGNOSIS — M7501 Adhesive capsulitis of right shoulder: Secondary | ICD-10-CM | POA: Diagnosis not present

## 2016-10-07 DIAGNOSIS — M7712 Lateral epicondylitis, left elbow: Secondary | ICD-10-CM | POA: Diagnosis not present

## 2016-10-21 DIAGNOSIS — T8484XA Pain due to internal orthopedic prosthetic devices, implants and grafts, initial encounter: Secondary | ICD-10-CM | POA: Diagnosis not present

## 2016-10-21 DIAGNOSIS — M25362 Other instability, left knee: Secondary | ICD-10-CM | POA: Diagnosis not present

## 2016-10-21 DIAGNOSIS — G8929 Other chronic pain: Secondary | ICD-10-CM | POA: Diagnosis not present

## 2016-10-21 DIAGNOSIS — M25562 Pain in left knee: Secondary | ICD-10-CM | POA: Diagnosis not present

## 2016-10-25 DIAGNOSIS — R531 Weakness: Secondary | ICD-10-CM | POA: Diagnosis not present

## 2016-10-25 DIAGNOSIS — M25562 Pain in left knee: Secondary | ICD-10-CM | POA: Diagnosis not present

## 2016-10-29 DIAGNOSIS — M25562 Pain in left knee: Secondary | ICD-10-CM | POA: Diagnosis not present

## 2016-10-29 DIAGNOSIS — R531 Weakness: Secondary | ICD-10-CM | POA: Diagnosis not present

## 2016-10-31 DIAGNOSIS — R531 Weakness: Secondary | ICD-10-CM | POA: Diagnosis not present

## 2016-10-31 DIAGNOSIS — M25562 Pain in left knee: Secondary | ICD-10-CM | POA: Diagnosis not present

## 2016-11-05 DIAGNOSIS — M25562 Pain in left knee: Secondary | ICD-10-CM | POA: Diagnosis not present

## 2016-11-05 DIAGNOSIS — R531 Weakness: Secondary | ICD-10-CM | POA: Diagnosis not present

## 2016-11-07 DIAGNOSIS — R531 Weakness: Secondary | ICD-10-CM | POA: Diagnosis not present

## 2016-11-07 DIAGNOSIS — M25562 Pain in left knee: Secondary | ICD-10-CM | POA: Diagnosis not present

## 2016-11-12 DIAGNOSIS — R531 Weakness: Secondary | ICD-10-CM | POA: Diagnosis not present

## 2016-11-12 DIAGNOSIS — M25562 Pain in left knee: Secondary | ICD-10-CM | POA: Diagnosis not present

## 2016-11-14 DIAGNOSIS — R531 Weakness: Secondary | ICD-10-CM | POA: Diagnosis not present

## 2016-11-14 DIAGNOSIS — M25562 Pain in left knee: Secondary | ICD-10-CM | POA: Diagnosis not present

## 2016-11-18 DIAGNOSIS — M4726 Other spondylosis with radiculopathy, lumbar region: Secondary | ICD-10-CM | POA: Diagnosis not present

## 2016-11-18 DIAGNOSIS — M5136 Other intervertebral disc degeneration, lumbar region: Secondary | ICD-10-CM | POA: Insufficient documentation

## 2016-11-18 DIAGNOSIS — M5116 Intervertebral disc disorders with radiculopathy, lumbar region: Secondary | ICD-10-CM | POA: Diagnosis not present

## 2016-11-18 DIAGNOSIS — M47818 Spondylosis without myelopathy or radiculopathy, sacral and sacrococcygeal region: Secondary | ICD-10-CM | POA: Diagnosis not present

## 2016-11-18 DIAGNOSIS — M5442 Lumbago with sciatica, left side: Secondary | ICD-10-CM | POA: Diagnosis not present

## 2016-11-18 DIAGNOSIS — M4696 Unspecified inflammatory spondylopathy, lumbar region: Secondary | ICD-10-CM | POA: Diagnosis not present

## 2016-11-18 DIAGNOSIS — M4316 Spondylolisthesis, lumbar region: Secondary | ICD-10-CM | POA: Diagnosis not present

## 2016-11-21 DIAGNOSIS — M4726 Other spondylosis with radiculopathy, lumbar region: Secondary | ICD-10-CM | POA: Diagnosis not present

## 2016-11-21 DIAGNOSIS — M5442 Lumbago with sciatica, left side: Secondary | ICD-10-CM | POA: Diagnosis not present

## 2016-11-21 DIAGNOSIS — M545 Low back pain: Secondary | ICD-10-CM | POA: Diagnosis not present

## 2016-11-26 DIAGNOSIS — M545 Low back pain: Secondary | ICD-10-CM | POA: Diagnosis not present

## 2016-11-26 DIAGNOSIS — M5442 Lumbago with sciatica, left side: Secondary | ICD-10-CM | POA: Diagnosis not present

## 2016-11-26 DIAGNOSIS — M4726 Other spondylosis with radiculopathy, lumbar region: Secondary | ICD-10-CM | POA: Diagnosis not present

## 2016-11-28 ENCOUNTER — Other Ambulatory Visit: Payer: Self-pay | Admitting: Family Medicine

## 2016-11-28 DIAGNOSIS — M4726 Other spondylosis with radiculopathy, lumbar region: Secondary | ICD-10-CM | POA: Diagnosis not present

## 2016-11-28 DIAGNOSIS — M545 Low back pain: Secondary | ICD-10-CM | POA: Diagnosis not present

## 2016-11-28 DIAGNOSIS — M5442 Lumbago with sciatica, left side: Secondary | ICD-10-CM | POA: Diagnosis not present

## 2016-12-02 ENCOUNTER — Telehealth: Payer: Self-pay | Admitting: Obstetrics & Gynecology

## 2016-12-02 NOTE — Telephone Encounter (Signed)
Opened in error

## 2016-12-03 ENCOUNTER — Telehealth: Payer: Self-pay | Admitting: *Deleted

## 2016-12-03 DIAGNOSIS — M5442 Lumbago with sciatica, left side: Secondary | ICD-10-CM | POA: Diagnosis not present

## 2016-12-03 DIAGNOSIS — M4726 Other spondylosis with radiculopathy, lumbar region: Secondary | ICD-10-CM | POA: Diagnosis not present

## 2016-12-03 DIAGNOSIS — M545 Low back pain: Secondary | ICD-10-CM | POA: Diagnosis not present

## 2016-12-03 NOTE — Telephone Encounter (Signed)
-----   Message from Jerene Bears, MD sent at 12/02/2016  4:10 PM EDT ----- Can you please call this pt and see if she took the myrbetriq.  If so, has she been checking her blood pressure?  If taking but not checking BP, needs OV for recheck.  Thanks.  Rosalita Chessman

## 2016-12-03 NOTE — Telephone Encounter (Signed)
Call to patient. Patient states she has been taking the myrbetriq since Dr. Hyacinth Meeker prescribed it to her in June, but "I totally forgot about checking my blood pressure." RN advised patient will need OV to check blood pressure. Patient agreeable. Nurse visit scheduled for Thursday 12/05/16 at 1030. Patient agreeable to date and time of appointment.   Routing to provider for final review. Patient agreeable to disposition. Will close encounter.

## 2016-12-05 ENCOUNTER — Ambulatory Visit (INDEPENDENT_AMBULATORY_CARE_PROVIDER_SITE_OTHER): Payer: 59 | Admitting: *Deleted

## 2016-12-05 ENCOUNTER — Other Ambulatory Visit: Payer: Self-pay | Admitting: Obstetrics & Gynecology

## 2016-12-05 VITALS — BP 122/76 | HR 100

## 2016-12-05 DIAGNOSIS — M545 Low back pain: Secondary | ICD-10-CM | POA: Diagnosis not present

## 2016-12-05 DIAGNOSIS — Z0130 Encounter for examination of blood pressure without abnormal findings: Secondary | ICD-10-CM

## 2016-12-05 DIAGNOSIS — M4726 Other spondylosis with radiculopathy, lumbar region: Secondary | ICD-10-CM | POA: Diagnosis not present

## 2016-12-05 DIAGNOSIS — M5442 Lumbago with sciatica, left side: Secondary | ICD-10-CM | POA: Diagnosis not present

## 2016-12-05 NOTE — Progress Notes (Signed)
Patient in for BP check.   BP today 122/76   Patient taking Myrbetriq as prescribed at night. Denies any problems today.

## 2016-12-05 NOTE — Progress Notes (Signed)
Pt is actually taking Toviaz 4mg  daily.  Rx sent to pharmacy.

## 2016-12-09 ENCOUNTER — Other Ambulatory Visit: Payer: Self-pay | Admitting: Obstetrics & Gynecology

## 2016-12-09 NOTE — Telephone Encounter (Signed)
Medication refill request:Toviaz  Last AEX:  09-17-16  Next AEX: 12-02-17  Last MMG (if hormonal medication request): 08-24-12 WNL  Refill authorized: please advise

## 2016-12-10 DIAGNOSIS — M4726 Other spondylosis with radiculopathy, lumbar region: Secondary | ICD-10-CM | POA: Diagnosis not present

## 2016-12-10 DIAGNOSIS — M545 Low back pain: Secondary | ICD-10-CM | POA: Diagnosis not present

## 2016-12-10 DIAGNOSIS — M5442 Lumbago with sciatica, left side: Secondary | ICD-10-CM | POA: Diagnosis not present

## 2016-12-12 DIAGNOSIS — M4726 Other spondylosis with radiculopathy, lumbar region: Secondary | ICD-10-CM | POA: Diagnosis not present

## 2016-12-12 DIAGNOSIS — M5442 Lumbago with sciatica, left side: Secondary | ICD-10-CM | POA: Diagnosis not present

## 2016-12-12 DIAGNOSIS — M545 Low back pain: Secondary | ICD-10-CM | POA: Diagnosis not present

## 2016-12-17 DIAGNOSIS — M4726 Other spondylosis with radiculopathy, lumbar region: Secondary | ICD-10-CM | POA: Diagnosis not present

## 2016-12-17 DIAGNOSIS — M5442 Lumbago with sciatica, left side: Secondary | ICD-10-CM | POA: Diagnosis not present

## 2016-12-17 DIAGNOSIS — M545 Low back pain: Secondary | ICD-10-CM | POA: Diagnosis not present

## 2016-12-18 DIAGNOSIS — M545 Low back pain: Secondary | ICD-10-CM | POA: Diagnosis not present

## 2016-12-18 DIAGNOSIS — M5136 Other intervertebral disc degeneration, lumbar region: Secondary | ICD-10-CM | POA: Diagnosis not present

## 2016-12-18 DIAGNOSIS — M4726 Other spondylosis with radiculopathy, lumbar region: Secondary | ICD-10-CM | POA: Diagnosis not present

## 2016-12-18 DIAGNOSIS — M5126 Other intervertebral disc displacement, lumbar region: Secondary | ICD-10-CM | POA: Diagnosis not present

## 2016-12-18 DIAGNOSIS — M5117 Intervertebral disc disorders with radiculopathy, lumbosacral region: Secondary | ICD-10-CM | POA: Diagnosis not present

## 2016-12-19 DIAGNOSIS — Z96652 Presence of left artificial knee joint: Secondary | ICD-10-CM | POA: Diagnosis not present

## 2016-12-19 DIAGNOSIS — T8484XD Pain due to internal orthopedic prosthetic devices, implants and grafts, subsequent encounter: Secondary | ICD-10-CM | POA: Diagnosis not present

## 2016-12-20 DIAGNOSIS — M5126 Other intervertebral disc displacement, lumbar region: Secondary | ICD-10-CM | POA: Diagnosis not present

## 2016-12-27 DIAGNOSIS — M5136 Other intervertebral disc degeneration, lumbar region: Secondary | ICD-10-CM | POA: Diagnosis not present

## 2016-12-27 DIAGNOSIS — M5126 Other intervertebral disc displacement, lumbar region: Secondary | ICD-10-CM | POA: Diagnosis not present

## 2016-12-27 DIAGNOSIS — M5416 Radiculopathy, lumbar region: Secondary | ICD-10-CM | POA: Diagnosis not present

## 2017-01-03 DIAGNOSIS — H40013 Open angle with borderline findings, low risk, bilateral: Secondary | ICD-10-CM | POA: Diagnosis not present

## 2017-01-13 ENCOUNTER — Telehealth: Payer: Self-pay

## 2017-01-13 ENCOUNTER — Telehealth: Payer: Self-pay | Admitting: Family Medicine

## 2017-01-13 HISTORY — PX: BACK SURGERY: SHX140

## 2017-01-13 NOTE — Telephone Encounter (Signed)
Order request sent to Dr Dayton Martes

## 2017-01-13 NOTE — Telephone Encounter (Signed)
Pt is having labs this Thursday for cpe. cpe is next week. Orders needs to be put in. thanks

## 2017-01-13 NOTE — Telephone Encounter (Signed)
Pt has labs scheduled for CPX on 01/16/17 [facility] labs; pt is scheduled for surgery at [facility] on 01/27/17 with preop labs on 01/20/17. Pt wants to know if can have all labs drawn at same time; pt will ck with [facility] to see what labs are needed and pt will cb with info.

## 2017-01-14 ENCOUNTER — Telehealth: Payer: Self-pay | Admitting: Family Medicine

## 2017-01-14 NOTE — Telephone Encounter (Signed)
Pt called about her lab work that she is scheduled for Thursday.  She has some questions about it and would like to speak to someone.  Can you please call her.

## 2017-01-15 ENCOUNTER — Other Ambulatory Visit: Payer: Self-pay | Admitting: Family Medicine

## 2017-01-15 DIAGNOSIS — Z01419 Encounter for gynecological examination (general) (routine) without abnormal findings: Secondary | ICD-10-CM

## 2017-01-16 ENCOUNTER — Other Ambulatory Visit (INDEPENDENT_AMBULATORY_CARE_PROVIDER_SITE_OTHER): Payer: 59

## 2017-01-16 DIAGNOSIS — Z01419 Encounter for gynecological examination (general) (routine) without abnormal findings: Secondary | ICD-10-CM | POA: Diagnosis not present

## 2017-01-16 LAB — LIPID PANEL
CHOL/HDL RATIO: 4
Cholesterol: 185 mg/dL (ref 0–200)
HDL: 47.6 mg/dL (ref >39.00)
LDL CALC: 122 mg/dL — AB (ref 0–99)
NONHDL: 137.21
Triglycerides: 77 mg/dL (ref 0.0–149.0)
VLDL: 15.4 mg/dL (ref 0.0–40.0)

## 2017-01-16 LAB — COMPREHENSIVE METABOLIC PANEL
ALT: 31 U/L (ref 0–35)
AST: 17 U/L (ref 0–37)
Albumin: 4.3 g/dL (ref 3.5–5.2)
Alkaline Phosphatase: 89 U/L (ref 39–117)
BUN: 8 mg/dL (ref 6–23)
CO2: 28 meq/L (ref 19–32)
Calcium: 9.6 mg/dL (ref 8.4–10.5)
Chloride: 103 mEq/L (ref 96–112)
Creatinine, Ser: 0.82 mg/dL (ref 0.40–1.20)
GFR: 78.49 mL/min (ref >60.00)
GLUCOSE: 111 mg/dL — AB (ref 70–99)
POTASSIUM: 4.5 meq/L (ref 3.5–5.1)
SODIUM: 139 meq/L (ref 135–145)
Total Bilirubin: 0.6 mg/dL (ref 0.2–1.2)
Total Protein: 7.1 g/dL (ref 6.0–8.3)

## 2017-01-16 LAB — CBC WITH DIFFERENTIAL/PLATELET
BASOS ABS: 0.1 10*3/uL (ref 0.0–0.1)
Basophils Relative: 0.8 % (ref 0.0–3.0)
EOS PCT: 3 % (ref 0.0–5.0)
Eosinophils Absolute: 0.2 10*3/uL (ref 0.0–0.7)
HCT: 42.7 % (ref 36.0–46.0)
Hemoglobin: 14.5 g/dL (ref 12.0–15.0)
LYMPHS ABS: 1.9 10*3/uL (ref 0.7–4.0)
Lymphocytes Relative: 29.4 % (ref 12.0–46.0)
MCHC: 33.9 g/dL (ref 30.0–36.0)
MCV: 90.9 fl (ref 78.0–100.0)
MONO ABS: 0.5 10*3/uL (ref 0.1–1.0)
Monocytes Relative: 7.1 % (ref 3.0–12.0)
NEUTROS PCT: 59.7 % (ref 43.0–77.0)
Neutro Abs: 3.9 10*3/uL (ref 1.4–7.7)
PLATELETS: 197 10*3/uL (ref 150.0–400.0)
RBC: 4.7 Mil/uL (ref 3.87–5.11)
RDW: 13 % (ref 11.5–15.5)
WBC: 6.5 10*3/uL (ref 4.0–10.5)

## 2017-01-16 LAB — TSH: TSH: 0.97 u[IU]/mL (ref 0.35–4.50)

## 2017-01-21 ENCOUNTER — Ambulatory Visit (INDEPENDENT_AMBULATORY_CARE_PROVIDER_SITE_OTHER): Payer: 59 | Admitting: Family Medicine

## 2017-01-21 ENCOUNTER — Encounter: Payer: Self-pay | Admitting: Family Medicine

## 2017-01-21 VITALS — BP 120/78 | HR 84 | Temp 98.2°F | Ht 66.0 in | Wt 247.4 lb

## 2017-01-21 DIAGNOSIS — Z23 Encounter for immunization: Secondary | ICD-10-CM

## 2017-01-21 DIAGNOSIS — E785 Hyperlipidemia, unspecified: Secondary | ICD-10-CM

## 2017-01-21 DIAGNOSIS — Z01419 Encounter for gynecological examination (general) (routine) without abnormal findings: Secondary | ICD-10-CM

## 2017-01-21 DIAGNOSIS — F332 Major depressive disorder, recurrent severe without psychotic features: Secondary | ICD-10-CM | POA: Diagnosis not present

## 2017-01-21 DIAGNOSIS — I10 Essential (primary) hypertension: Secondary | ICD-10-CM

## 2017-01-21 NOTE — Progress Notes (Signed)
Subjective:   Patient ID: Brenda Obrien, female    DOB: [birth year], 51 y.o.   MRN: [id number]  Brenda Obrien is a pleasant 51 y.o. year old female who presents to clinic today with Annual Exam (Patient is here today for a CPE.  Had PAP done in June which was WNL. Is having back surgery on Monday and is wondering if ok to get the flu shot.)  on 01/21/2017  HPI:  Has had a rough year.  MDD/PTSD-  Intentional drug overdose on benzos in 02/2016- 5 day admission to behavioral health.  Another suicide attempt requiring hospitalization in 05/2016 (in [place]).  Currently taking Cymbalta 90 mg daily, buspar 10 mg three times daily, brexpiprazole and trazodone. Seeing a therapist weekly and psychiatrist every 6 weeks. Feels current rxs and management is working well.  Denies any symptoms of anxiety or depression currently.  Another Shoulder surgery in 03/2016- has had multiple complications, adhesive capsulitis, since her first shoulder surgery.  Having back surgery on Monday.  Has GYN- sees Dr. Leda Quail. Remote h/o hysterectomy.  Was last seen by her on 09/17/16.  Note reviewed.  HTN- currently taking lisinopril 20 mg daily. Lab Results  Component Value Date   CREATININE 0.82 01/16/2017     HLD- taking zocor 20 mg daily. Denies myalgias.  Lab Results  Component Value Date   CHOL 185 01/16/2017   HDL 47.60 01/16/2017   LDLCALC 122 (H) 01/16/2017   LDLDIRECT 165.6 02/18/2011   TRIG 77.0 01/16/2017   CHOLHDL 4 01/16/2017    Current Outpatient Prescriptions on File Prior to Visit  Medication Sig Dispense Refill  . Brexpiprazole 1 MG TABS Take 1 mg by mouth daily.    . busPIRone (BUSPAR) 10 MG tablet Take 10 mg by mouth 3 (three) times daily.    . DULoxetine (CYMBALTA) 60 MG capsule Take 60 mg by mouth 2 (two) times daily.     Marland Kitchen gabapentin (NEURONTIN) 300 MG capsule Take 300 mg by mouth 3 (three) times daily. 1qam; 2@noon ; 3qhs    . hydrOXYzine (ATARAX/VISTARIL) 25  MG tablet     . hydrOXYzine (VISTARIL) 25 MG capsule Take 25 mg by mouth as needed.    Marland Kitchen lisinopril (PRINIVIL,ZESTRIL) 20 MG tablet Take 1 tablet (20 mg total) by mouth daily. 30 tablet 0  . omeprazole (PRILOSEC) 40 MG capsule Take 1 capsule (40 mg total) by mouth daily. 90 capsule 3  . prazosin (MINIPRESS) 2 MG capsule Take 2 mg by mouth 2 (two) times daily.    . simvastatin (ZOCOR) 20 MG tablet TAKE 1 TABLET BY MOUTH AT  BEDTIME 90 tablet 0  . traZODone (DESYREL) 50 MG tablet Take 1 tablet (50 mg total) by mouth at bedtime as needed for sleep. 30 tablet 0   No current facility-administered medications on file prior to visit.     No Known Allergies  Past Medical History:  Diagnosis Date  . Asthma   . Cataract   . Depression   . Elevated cholesterol   . Endometriosis   . Generalized anxiety disorder 2017  . GERD (gastroesophageal reflux disease)   . Glaucoma   . 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 Hypertension   . PTSD (post-traumatic stress disorder) 2017    Past Surgical History:  Procedure Laterality Date  . ANTERIOR CRUCIATE LIGAMENT REPAIR  1987,2001,2003,2005  . CATARACT EXTRACTION Bilateral 2016  . LAPAROSCOPIC CHOLECYSTECTOMY  5/14   [place], [place]  . LAPAROSCOPIC TOTAL HYSTERECTOMY  6/11  .  left knee surgery  09/2011  . NASAL SINUS SURGERY  1992  . REPLACEMENT TOTAL KNEE Left 9/14   Dr. Elliot Dally  . SHOULDER SURGERY Right 09/08/2015   due to injury   . SHOULDER SURGERY Right 03/20/2016   due to injury    Family History  Problem Relation Age of Onset  . Coronary artery disease Mother        PAD  . Heart attack Mother 65  . Diabetes Mother   . Lung cancer Father   . Heart attack Father 30  . Heart attack Brother 45  . Hepatitis Brother        Hep B  . Bipolar disorder Sister     Social History   Social History  . Marital status: Single    Spouse name: N/A  . Number of children: N/A  . Years of education: N/A   Occupational History  . 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 Smoking status: Current Every Day Smoker    Packs/day: 1.00    Types: Cigarettes    Last attempt to quit: 04/15/2004  . Smokeless tobacco: Never Used  . Alcohol use 7.2 oz/week    12 Cans of beer per week     Comment: Socially  . Drug use: No  . Sexual activity: Not Currently    Birth control/ protection: Surgical     Comment: hysterectomy   Other Topics Concern  . Not on file   Social History Narrative   Single      No regular exercise      Works as Conservator, museum/gallery for [employer]         The PMH, PSH, Social History, Family History, Medications, and allergies have been reviewed in [facility], and have been updated if relevant.   Review of Systems  Constitutional: Negative.   HENT: Negative.   Respiratory: Negative.   Cardiovascular: Negative.   Gastrointestinal: Negative.   Endocrine: Negative.   Genitourinary: Negative.   Musculoskeletal: Positive for back pain.  Allergic/Immunologic: Negative.   Neurological: Negative.   Hematological: Negative.   Psychiatric/Behavioral: Negative.   All other systems reviewed and are negative.      Objective:    BP 120/78 (BP Location: Right Arm, Patient Position: Sitting, Cuff Size: Large)   Pulse 84   Temp 98.2 F (36.8 C) (Oral)   Ht  (1.676 m)   Wt 247 lb 6.4 oz (112.2 kg)   SpO2 96%   BMI 39.93 kg/m    Physical Exam   General:  Well-developed,well-nourished,in no acute distress; alert,appropriate and cooperative throughout examination Head:  normocephalic and atraumatic.   Eyes:  vision grossly intact, PERRL Ears:  R ear normal and L ear normal externally, TMs clear bilaterally Nose:  no external deformity.   Mouth:  good dentition.   Neck:  No deformities, masses, or tenderness noted. Lungs:  Normal respiratory effort, chest expands symmetrically. Lungs are clear to auscultation, no crackles or wheezes. Heart:  Normal rate and regular  rhythm. S1 and S2 normal without gallop, murmur, click, rub or other extra sounds. Abdomen:  Bowel sounds positive,abdomen soft and non-tender without masses, organomegaly or hernias noted. f thoracic or lumbar spine.   Extremities:  No clubbing, cyanosis, edema, or deformity noted with normal full range of motion of all joints.   Neurologic:  alert & oriented X3 and gait normal.   Skin:  Intact without suspicious lesions  or rashes Cervical Nodes:  No lymphadenopathy noted Axillary Nodes:  No palpable lymphadenopathy Psych:  Cognition and judgment appear intact. Alert and cooperative with normal attention span and concentration. No apparent delusions, illusions, hallucinations       Assessment & Plan:   Well woman exam  Severe episode of recurrent major depressive disorder, without psychotic features (HCC) No Follow-up on file.

## 2017-01-21 NOTE — Assessment & Plan Note (Signed)
Stable on current rxs. Followed by psych.

## 2017-01-21 NOTE — Assessment & Plan Note (Signed)
Well controlled. No changes made today. 

## 2017-01-21 NOTE — Assessment & Plan Note (Signed)
Reasonable control. No changes made to rxs. 

## 2017-01-21 NOTE — Assessment & Plan Note (Signed)
Reviewed preventive care protocols, scheduled due services, and updated immunizations Discussed nutrition, exercise, diet, and healthy lifestyle.  Influenza vaccine given today. 

## 2017-01-27 DIAGNOSIS — M5126 Other intervertebral disc displacement, lumbar region: Secondary | ICD-10-CM | POA: Diagnosis not present

## 2017-01-27 DIAGNOSIS — I10 Essential (primary) hypertension: Secondary | ICD-10-CM | POA: Diagnosis not present

## 2017-01-27 DIAGNOSIS — Z978 Presence of other specified devices: Secondary | ICD-10-CM | POA: Diagnosis not present

## 2017-01-27 DIAGNOSIS — M5117 Intervertebral disc disorders with radiculopathy, lumbosacral region: Secondary | ICD-10-CM | POA: Diagnosis not present

## 2017-01-27 DIAGNOSIS — M5116 Intervertebral disc disorders with radiculopathy, lumbar region: Secondary | ICD-10-CM | POA: Diagnosis not present

## 2017-01-27 DIAGNOSIS — K219 Gastro-esophageal reflux disease without esophagitis: Secondary | ICD-10-CM | POA: Diagnosis not present

## 2017-01-28 ENCOUNTER — Other Ambulatory Visit: Payer: Self-pay | Admitting: Family Medicine

## 2017-02-02 DIAGNOSIS — R52 Pain, unspecified: Secondary | ICD-10-CM | POA: Diagnosis not present

## 2017-02-02 DIAGNOSIS — I10 Essential (primary) hypertension: Secondary | ICD-10-CM | POA: Diagnosis not present

## 2017-02-02 DIAGNOSIS — J22 Unspecified acute lower respiratory infection: Secondary | ICD-10-CM | POA: Diagnosis not present

## 2017-02-04 DIAGNOSIS — J22 Unspecified acute lower respiratory infection: Secondary | ICD-10-CM | POA: Diagnosis not present

## 2017-02-04 DIAGNOSIS — G8929 Other chronic pain: Secondary | ICD-10-CM | POA: Diagnosis not present

## 2017-02-04 DIAGNOSIS — M545 Low back pain: Secondary | ICD-10-CM | POA: Diagnosis not present

## 2017-02-07 ENCOUNTER — Encounter: Payer: Self-pay | Admitting: Family Medicine

## 2017-02-10 DIAGNOSIS — I10 Essential (primary) hypertension: Secondary | ICD-10-CM | POA: Diagnosis not present

## 2017-02-10 DIAGNOSIS — J22 Unspecified acute lower respiratory infection: Secondary | ICD-10-CM | POA: Diagnosis not present

## 2017-02-10 DIAGNOSIS — K219 Gastro-esophageal reflux disease without esophagitis: Secondary | ICD-10-CM | POA: Diagnosis not present

## 2017-02-14 DIAGNOSIS — G8929 Other chronic pain: Secondary | ICD-10-CM | POA: Diagnosis not present

## 2017-02-14 DIAGNOSIS — M545 Low back pain: Secondary | ICD-10-CM | POA: Diagnosis not present

## 2017-02-14 DIAGNOSIS — J22 Unspecified acute lower respiratory infection: Secondary | ICD-10-CM | POA: Diagnosis not present

## 2017-02-17 ENCOUNTER — Other Ambulatory Visit: Payer: Self-pay | Admitting: Family Medicine

## 2017-02-19 DIAGNOSIS — Z9889 Other specified postprocedural states: Secondary | ICD-10-CM | POA: Diagnosis not present

## 2017-02-19 DIAGNOSIS — M5137 Other intervertebral disc degeneration, lumbosacral region: Secondary | ICD-10-CM | POA: Diagnosis not present

## 2017-02-19 DIAGNOSIS — M4807 Spinal stenosis, lumbosacral region: Secondary | ICD-10-CM | POA: Diagnosis not present

## 2017-02-19 DIAGNOSIS — Z4789 Encounter for other orthopedic aftercare: Secondary | ICD-10-CM | POA: Diagnosis not present

## 2017-02-19 DIAGNOSIS — M1288 Other specific arthropathies, not elsewhere classified, other specified site: Secondary | ICD-10-CM | POA: Diagnosis not present

## 2017-03-13 DIAGNOSIS — M545 Low back pain: Secondary | ICD-10-CM | POA: Diagnosis not present

## 2017-03-13 DIAGNOSIS — M4726 Other spondylosis with radiculopathy, lumbar region: Secondary | ICD-10-CM | POA: Diagnosis not present

## 2017-03-13 DIAGNOSIS — M5136 Other intervertebral disc degeneration, lumbar region: Secondary | ICD-10-CM | POA: Diagnosis not present

## 2017-03-18 DIAGNOSIS — M4726 Other spondylosis with radiculopathy, lumbar region: Secondary | ICD-10-CM | POA: Diagnosis not present

## 2017-03-18 DIAGNOSIS — M545 Low back pain: Secondary | ICD-10-CM | POA: Diagnosis not present

## 2017-03-18 DIAGNOSIS — M5136 Other intervertebral disc degeneration, lumbar region: Secondary | ICD-10-CM | POA: Diagnosis not present

## 2017-03-21 DIAGNOSIS — M5136 Other intervertebral disc degeneration, lumbar region: Secondary | ICD-10-CM | POA: Diagnosis not present

## 2017-03-21 DIAGNOSIS — M4726 Other spondylosis with radiculopathy, lumbar region: Secondary | ICD-10-CM | POA: Diagnosis not present

## 2017-03-21 DIAGNOSIS — M545 Low back pain: Secondary | ICD-10-CM | POA: Diagnosis not present

## 2017-03-25 DIAGNOSIS — M5136 Other intervertebral disc degeneration, lumbar region: Secondary | ICD-10-CM | POA: Diagnosis not present

## 2017-03-25 DIAGNOSIS — M4726 Other spondylosis with radiculopathy, lumbar region: Secondary | ICD-10-CM | POA: Diagnosis not present

## 2017-03-25 DIAGNOSIS — M545 Low back pain: Secondary | ICD-10-CM | POA: Diagnosis not present

## 2017-03-28 DIAGNOSIS — M5136 Other intervertebral disc degeneration, lumbar region: Secondary | ICD-10-CM | POA: Diagnosis not present

## 2017-03-28 DIAGNOSIS — M545 Low back pain: Secondary | ICD-10-CM | POA: Diagnosis not present

## 2017-03-28 DIAGNOSIS — M4726 Other spondylosis with radiculopathy, lumbar region: Secondary | ICD-10-CM | POA: Diagnosis not present

## 2017-03-31 ENCOUNTER — Encounter: Payer: Self-pay | Admitting: Family Medicine

## 2017-03-31 ENCOUNTER — Ambulatory Visit (INDEPENDENT_AMBULATORY_CARE_PROVIDER_SITE_OTHER): Payer: 59 | Admitting: Family Medicine

## 2017-03-31 VITALS — BP 130/80 | HR 108 | Temp 97.9°F | Wt 254.6 lb

## 2017-03-31 DIAGNOSIS — M4726 Other spondylosis with radiculopathy, lumbar region: Secondary | ICD-10-CM | POA: Diagnosis not present

## 2017-03-31 DIAGNOSIS — L299 Pruritus, unspecified: Secondary | ICD-10-CM | POA: Diagnosis not present

## 2017-03-31 DIAGNOSIS — M5136 Other intervertebral disc degeneration, lumbar region: Secondary | ICD-10-CM | POA: Diagnosis not present

## 2017-03-31 DIAGNOSIS — M545 Low back pain: Secondary | ICD-10-CM | POA: Diagnosis not present

## 2017-03-31 NOTE — Progress Notes (Signed)
   Subjective:    Patient ID: Brenda Obrien, female    DOB: 03/29/1966, 51 y.o.   MRN: [id number]  HPI Here for one week of itching in the right ear canal. No pain. Her hearing is not affected. No sinus congestion.    Review of Systems  Constitutional: Negative.   HENT: Negative.   Eyes: Negative.   Respiratory: Negative.        Objective:   Physical Exam  Constitutional: She appears well-developed and well-nourished.  HENT:  Left Ear: External ear normal.  Nose: Nose normal.  Mouth/Throat: Oropharynx is clear and moist.  Right ear canal is slightly red, no cerumen. The TM is clear.   Eyes: Conjunctivae are normal.  Neck: No thyromegaly present.  Pulmonary/Chest: Effort normal and breath sounds normal. No respiratory distress. She has no wheezes. She has no rales.  Lymphadenopathy:    She has no cervical adenopathy.          Assessment & Plan:  Eczema. She may apply a small amount of OTC cortisone cream to the canal 2-3 times daily. Recheck prn. Gershon CraneStephen Fry, MD

## 2017-04-02 DIAGNOSIS — M4726 Other spondylosis with radiculopathy, lumbar region: Secondary | ICD-10-CM | POA: Diagnosis not present

## 2017-04-02 DIAGNOSIS — M545 Low back pain: Secondary | ICD-10-CM | POA: Diagnosis not present

## 2017-04-02 DIAGNOSIS — M5416 Radiculopathy, lumbar region: Secondary | ICD-10-CM | POA: Diagnosis not present

## 2017-04-10 ENCOUNTER — Telehealth: Payer: Self-pay | Admitting: Family Medicine

## 2017-04-10 MED ORDER — LISINOPRIL 20 MG PO TABS: 20.0000 mg | ORAL_TABLET | Freq: Every day | ORAL | 0 refills | Status: DC

## 2017-04-10 MED ORDER — SIMVASTATIN 20 MG PO TABS: 20.0000 mg | ORAL_TABLET | Freq: Every day | ORAL | 0 refills | Status: DC

## 2017-04-10 NOTE — Telephone Encounter (Signed)
Copied from CRM [id number]. Topic: Quick Communication - See Telephone Encounter >> Apr 10, 2017 11:47 AM Eston Mouldavis, Holton Sidman B wrote: CRM for notification. See Telephone encounter for:  Refill simvastatin  [facility] 04/10/17.

## 2017-04-10 NOTE — Telephone Encounter (Signed)
Copied from CRM [id number]. Topic: Quick Communication - Rx Refill/Question >> Apr 10, 2017 11:38 AM Gerrianne ScalePayne, Jian Hodgman L wrote: Has the patient contacted their pharmacy? Yes.     (Agent: If no, request that the patient contact the pharmacy for the refill.)  lisinopril (PRINIVIL,ZESTRIL) 20 MG tablet  omeprazole (PRILOSEC) 40 MG capsule   Preferred Pharmacy (with phone number or street name): [id number] [place],  - [address] [phone] (Phone) [phone] (Fax)     Agent: Please be advised that RX refills may take up to 3 business days. We ask that you follow-up with your pharmacy.

## 2017-04-10 NOTE — Telephone Encounter (Signed)
Per historical Rx's yes TA has filled/sent in Rx's for 90d/thx dmf

## 2017-04-10 NOTE — Telephone Encounter (Signed)
Cannot see where Dr. Dayton MartesAron has refilled Zestril. Please advise.

## 2017-04-11 ENCOUNTER — Other Ambulatory Visit: Payer: Self-pay | Admitting: *Deleted

## 2017-04-11 DIAGNOSIS — M545 Low back pain: Secondary | ICD-10-CM | POA: Diagnosis not present

## 2017-04-11 DIAGNOSIS — M4726 Other spondylosis with radiculopathy, lumbar region: Secondary | ICD-10-CM | POA: Diagnosis not present

## 2017-04-11 DIAGNOSIS — M5136 Other intervertebral disc degeneration, lumbar region: Secondary | ICD-10-CM | POA: Diagnosis not present

## 2017-04-11 MED ORDER — OMEPRAZOLE 40 MG PO CPDR: 40.0000 mg | DELAYED_RELEASE_CAPSULE | Freq: Every day | ORAL | 3 refills | Status: DC

## 2017-04-11 NOTE — Telephone Encounter (Signed)
Pt states she picked up her meds today & does not have the PRILOSEC 40mg . The pharmacy said they never received a request for it. [facility] [address]

## 2017-04-14 DIAGNOSIS — M5136 Other intervertebral disc degeneration, lumbar region: Secondary | ICD-10-CM | POA: Diagnosis not present

## 2017-04-14 DIAGNOSIS — M545 Low back pain: Secondary | ICD-10-CM | POA: Diagnosis not present

## 2017-04-14 DIAGNOSIS — M4726 Other spondylosis with radiculopathy, lumbar region: Secondary | ICD-10-CM | POA: Diagnosis not present

## 2017-05-07 ENCOUNTER — Other Ambulatory Visit: Payer: Self-pay | Admitting: Family Medicine

## 2017-05-08 NOTE — Telephone Encounter (Signed)
Spoke with the pt, she is no longer use [facility] for her pharmacy anymore, [facility] Rx denied, [facility].

## 2017-05-26 ENCOUNTER — Ambulatory Visit: Payer: Self-pay | Admitting: Family Medicine

## 2017-06-04 ENCOUNTER — Other Ambulatory Visit: Payer: Self-pay | Admitting: Family Medicine

## 2017-06-11 DIAGNOSIS — M5416 Radiculopathy, lumbar region: Secondary | ICD-10-CM | POA: Diagnosis not present

## 2017-07-15 DIAGNOSIS — R0600 Dyspnea, unspecified: Secondary | ICD-10-CM | POA: Diagnosis not present

## 2017-07-15 DIAGNOSIS — M25512 Pain in left shoulder: Secondary | ICD-10-CM | POA: Diagnosis not present

## 2017-07-15 DIAGNOSIS — G8929 Other chronic pain: Secondary | ICD-10-CM | POA: Diagnosis not present

## 2017-07-25 DIAGNOSIS — M75112 Incomplete rotator cuff tear or rupture of left shoulder, not specified as traumatic: Secondary | ICD-10-CM | POA: Diagnosis not present

## 2017-07-25 DIAGNOSIS — M67912 Unspecified disorder of synovium and tendon, left shoulder: Secondary | ICD-10-CM | POA: Diagnosis not present

## 2017-07-25 DIAGNOSIS — M7552 Bursitis of left shoulder: Secondary | ICD-10-CM | POA: Diagnosis not present

## 2017-07-31 DIAGNOSIS — M75112 Incomplete rotator cuff tear or rupture of left shoulder, not specified as traumatic: Secondary | ICD-10-CM | POA: Diagnosis not present

## 2017-07-31 DIAGNOSIS — M7542 Impingement syndrome of left shoulder: Secondary | ICD-10-CM | POA: Diagnosis not present

## 2017-08-11 DIAGNOSIS — M4726 Other spondylosis with radiculopathy, lumbar region: Secondary | ICD-10-CM | POA: Diagnosis not present

## 2017-08-11 DIAGNOSIS — Z9889 Other specified postprocedural states: Secondary | ICD-10-CM | POA: Diagnosis not present

## 2017-08-11 DIAGNOSIS — M5126 Other intervertebral disc displacement, lumbar region: Secondary | ICD-10-CM | POA: Diagnosis not present

## 2017-08-11 DIAGNOSIS — M545 Low back pain: Secondary | ICD-10-CM | POA: Diagnosis not present

## 2017-08-15 DIAGNOSIS — M5126 Other intervertebral disc displacement, lumbar region: Secondary | ICD-10-CM | POA: Diagnosis not present

## 2017-08-15 DIAGNOSIS — M545 Low back pain: Secondary | ICD-10-CM | POA: Diagnosis not present

## 2017-08-19 DIAGNOSIS — H524 Presbyopia: Secondary | ICD-10-CM | POA: Diagnosis not present

## 2017-08-19 DIAGNOSIS — M5126 Other intervertebral disc displacement, lumbar region: Secondary | ICD-10-CM | POA: Diagnosis not present

## 2017-08-19 DIAGNOSIS — M545 Low back pain: Secondary | ICD-10-CM | POA: Diagnosis not present

## 2017-08-22 DIAGNOSIS — M5126 Other intervertebral disc displacement, lumbar region: Secondary | ICD-10-CM | POA: Diagnosis not present

## 2017-08-22 DIAGNOSIS — M545 Low back pain: Secondary | ICD-10-CM | POA: Diagnosis not present

## 2017-08-26 DIAGNOSIS — M545 Low back pain: Secondary | ICD-10-CM | POA: Diagnosis not present

## 2017-08-26 DIAGNOSIS — M5126 Other intervertebral disc displacement, lumbar region: Secondary | ICD-10-CM | POA: Diagnosis not present

## 2017-08-28 DIAGNOSIS — M75112 Incomplete rotator cuff tear or rupture of left shoulder, not specified as traumatic: Secondary | ICD-10-CM | POA: Diagnosis not present

## 2017-08-28 DIAGNOSIS — M7542 Impingement syndrome of left shoulder: Secondary | ICD-10-CM | POA: Diagnosis not present

## 2017-08-29 DIAGNOSIS — M5126 Other intervertebral disc displacement, lumbar region: Secondary | ICD-10-CM | POA: Diagnosis not present

## 2017-08-29 DIAGNOSIS — M545 Low back pain: Secondary | ICD-10-CM | POA: Diagnosis not present

## 2017-09-02 DIAGNOSIS — M545 Low back pain: Secondary | ICD-10-CM | POA: Diagnosis not present

## 2017-09-02 DIAGNOSIS — M5126 Other intervertebral disc displacement, lumbar region: Secondary | ICD-10-CM | POA: Diagnosis not present

## 2017-09-04 DIAGNOSIS — Z4789 Encounter for other orthopedic aftercare: Secondary | ICD-10-CM | POA: Diagnosis not present

## 2017-09-10 DIAGNOSIS — M7542 Impingement syndrome of left shoulder: Secondary | ICD-10-CM | POA: Diagnosis not present

## 2017-09-10 DIAGNOSIS — G8918 Other acute postprocedural pain: Secondary | ICD-10-CM | POA: Diagnosis not present

## 2017-09-10 DIAGNOSIS — M75102 Unspecified rotator cuff tear or rupture of left shoulder, not specified as traumatic: Secondary | ICD-10-CM | POA: Diagnosis not present

## 2017-09-10 DIAGNOSIS — S46012A Strain of muscle(s) and tendon(s) of the rotator cuff of left shoulder, initial encounter: Secondary | ICD-10-CM | POA: Diagnosis not present

## 2017-09-10 DIAGNOSIS — S46112A Strain of muscle, fascia and tendon of long head of biceps, left arm, initial encounter: Secondary | ICD-10-CM | POA: Diagnosis not present

## 2017-09-10 DIAGNOSIS — S46812A Strain of other muscles, fascia and tendons at shoulder and upper arm level, left arm, initial encounter: Secondary | ICD-10-CM | POA: Diagnosis not present

## 2017-09-10 HISTORY — PX: SHOULDER SURGERY: SHX246

## 2017-09-19 ENCOUNTER — Other Ambulatory Visit: Payer: Self-pay

## 2017-09-19 ENCOUNTER — Ambulatory Visit (INDEPENDENT_AMBULATORY_CARE_PROVIDER_SITE_OTHER): Payer: 59 | Admitting: Obstetrics & Gynecology

## 2017-09-19 ENCOUNTER — Encounter: Payer: Self-pay | Admitting: Obstetrics & Gynecology

## 2017-09-19 VITALS — BP 140/80 | HR 108 | Resp 16 | Ht 66.0 in | Wt 261.0 lb

## 2017-09-19 DIAGNOSIS — M25512 Pain in left shoulder: Secondary | ICD-10-CM | POA: Diagnosis not present

## 2017-09-19 DIAGNOSIS — Z01419 Encounter for gynecological examination (general) (routine) without abnormal findings: Secondary | ICD-10-CM | POA: Diagnosis not present

## 2017-09-19 DIAGNOSIS — M25511 Pain in right shoulder: Secondary | ICD-10-CM

## 2017-09-19 MED ORDER — GABAPENTIN 600 MG PO TABS: 600.0000 mg | ORAL_TABLET | Freq: Two times a day (BID) | ORAL | Status: DC

## 2017-09-19 NOTE — Progress Notes (Signed)
52 y.o. G0P0000 SingleCaucasianF here for annual exam.  Continuing to have joint issues.  She's had now a third surgery on her left shoulder.  Does have significant osteoarthritis in her knees and hips.  Had back surgery--L5, S1 laminectomies.  Had back spasms after surgery that were quite severe.  Has been told to exercise but can't do much with her knees, back, or shoulders.   Having a lot more issues with depression due to surgeries.    Has been denies federal disability twice.  Has not had her hearing scheduled.  States this could take several more months.    Denies vaginal bleeding.     Desires several skin tags from her inner thighs to be removed.  [facility] [employer]:  Patriciaann Clan, PA.  No LMP recorded. Patient has had a hysterectomy.          Sexually active: No.  The current method of family planning is status post hysterectomy.    Exercising: No.   Smoker:  yes  Health Maintenance: Pap:  2010 Normal  History of abnormal Pap:  no MMG:  08/25/12 BIRADS1:Neg  Colonoscopy:  Never.  Declines today.  Interested in considering cologuard. BMD:   never TDaP:  2008 Pneumonia vaccine(s):  n/a Shingrix: No Hep C testing: 03/18/16 Neg  Screening Labs: PCP   reports that she has been smoking cigarettes.  She has been smoking about 1.00 pack per day. She has never used smokeless tobacco. She reports that she drinks about 7.2 oz of alcohol per week. She reports that she does not use drugs.  Past Medical History:  Diagnosis Date  . Asthma   . Cataract   . Depression   . Elevated cholesterol   . Endometriosis   . Generalized anxiety disorder 2017  . GERD (gastroesophageal reflux disease)   . Glaucoma   . 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 Hypertension   . PTSD (post-traumatic stress disorder) 2017    Past Surgical History:  Procedure Laterality Date  . ANTERIOR CRUCIATE LIGAMENT REPAIR  1987,2001,2003,2005  . BACK SURGERY  01/2017   due herniated disc  . CATARACT EXTRACTION Bilateral 2016   . LAPAROSCOPIC CHOLECYSTECTOMY  5/14   [place], [place]  . LAPAROSCOPIC TOTAL HYSTERECTOMY  6/11  . left knee surgery  09/2011  . NASAL SINUS SURGERY  1992  . REPLACEMENT TOTAL KNEE Left 9/14   Dr. Laurance Flatten  . SHOULDER SURGERY Right 09/08/2015   due to injury   . SHOULDER SURGERY Right 03/20/2016   due to injury  . SHOULDER SURGERY Left 09/10/2017   due rotator cuff    Current Outpatient Medications  Medication Sig Dispense Refill  . acetaminophen (TYLENOL) 500 MG tablet Take 2 tablets by mouth every 8 (eight) hours as needed.    Marland Kitchen amitriptyline (ELAVIL) 50 MG tablet TAKE 1 TO 2 AT BEDTIME TAKE AS NEEDED FOR INSOMNIA  3  . Brexpiprazole (REXULTI) 3 MG TABS Take 1 tablet by mouth daily.    . cyclobenzaprine (FLEXERIL) 10 MG tablet Take 1 tablet by mouth daily.    . DULoxetine (CYMBALTA) 60 MG capsule Take 60 mg by mouth 2 (two) times daily.     Marland Kitchen gabapentin (NEURONTIN) 600 MG tablet Take 1 tablet (600 mg total) by mouth 2 (two) times daily.    . hydrOXYzine (VISTARIL) 25 MG capsule Take 25 mg by mouth as needed.    Marland Kitchen lisinopril (PRINIVIL,ZESTRIL) 20 MG tablet Take 1 tablet (20 mg total) by mouth daily. 90 tablet 0  .  omeprazole (PRILOSEC) 40 MG capsule TAKE 1 CAPSULE BY MOUTH  DAILY 90 capsule 2  . oxyCODONE (OXY IR/ROXICODONE) 5 MG immediate release tablet Take 1-2 tablets by mouth every 4 (four) hours as needed.  0  . prazosin (MINIPRESS) 2 MG capsule Take 2 mg by mouth 2 (two) times daily.    . simvastatin (ZOCOR) 20 MG tablet Take 1 tablet (20 mg total) by mouth at bedtime. 90 tablet 0  . amoxicillin (AMOXIL) 500 MG capsule For dental appt     No current facility-administered medications for this visit.     Family History  Problem Relation Age of Onset  . Coronary artery disease Mother        PAD  . Heart attack Mother 73  . Diabetes Mother   . Lung cancer Father   . Heart attack Father 82  . Heart attack Brother 76  . Hepatitis Brother        Hep B  . Bipolar disorder  Sister     Review of Systems  HENT: Positive for congestion.   Musculoskeletal: Positive for myalgias.  Neurological:       Difficulty with memory   Psychiatric/Behavioral: Positive for depression. The patient is nervous/anxious.   All other systems reviewed and are negative.   Exam:   BP 140/80 (BP Location: Right Arm, Patient Position: Sitting, Cuff Size: Large)   Pulse (!) 108   Resp 16   Ht 5' 6"  (1.676 m)   Wt 261 lb (118.4 kg)   BMI 42.13 kg/m   Height: 5' 6"  (167.6 cm)  Ht Readings from Last 3 Encounters:  09/19/17 5' 6"  (1.676 m)  01/21/17 5' 6"  (1.676 m)  09/17/16 5' 6"  (1.676 m)    General appearance: alert, cooperative and appears stated age Head: Normocephalic, without obvious abnormality, atraumatic Neck: no adenopathy, supple, symmetrical, trachea midline and thyroid normal to inspection and palpation Lungs: clear to auscultation bilaterally Breasts: normal appearance, no masses or tenderness Heart: regular rate and rhythm Abdomen: soft, non-tender; bowel sounds normal; no masses,  no organomegaly Extremities: extremities normal, atraumatic, no cyanosis or edema Skin: Skin color, texture, turgor normal. No rashes or lesions Lymph nodes: Cervical, supraclavicular, and axillary nodes normal. No abnormal inguinal nodes palpated Neurologic: Grossly normal   Pelvic: External genitalia:  no lesions              Urethra:  normal appearing urethra with no masses, tenderness or lesions              Bartholins and Skenes: normal                 Vagina: normal appearing vagina with normal color and discharge, no lesions              Cervix: absent              Pap taken: No. Bimanual Exam:  Uterus:  uterus absent              Adnexa: no mass, fullness, tenderness               Rectovaginal: Confirms               Anus:  normal sphincter tone, no lesions  Procedure:  Verbal consent obtained.  Skin cleansed with Betadine x 3.  Lesions anesthestized with 1%  Lidocaine.  9 lesions removed.  Silver nitrate used for hemostasis.  Dressings applied.  No pathology sent today.  Pt tolerated procedure  well.  Chaperone was present for exam.  A:  Well Woman with normal exam H/O robotic assisted TLH H/o depression (and suicide attempt 11/17) Hypertension Elevated lipids OAB Multiple joint pain issues/surgery  P:   Mammogram guidelines reviewed.  Declined due to shoulder surgery.  Will see if can have ultrasound performed.  Pt concerned about coverage.  pap smear not indicated Skin tags removed Declines colonoscopy.  Cologuard order placed.   Lab work UTD Will test CRP, ANA, and ESR today. Return annually or prn

## 2017-09-20 LAB — C-REACTIVE PROTEIN: CRP: 10.7 mg/L — ABNORMAL HIGH (ref 0.0–4.9)

## 2017-09-20 LAB — SEDIMENTATION RATE: SED RATE: 48 mm/h — AB (ref 0–40)

## 2017-09-20 LAB — ANA: Anti Nuclear Antibody(ANA): NEGATIVE

## 2017-09-22 DIAGNOSIS — M75112 Incomplete rotator cuff tear or rupture of left shoulder, not specified as traumatic: Secondary | ICD-10-CM | POA: Diagnosis not present

## 2017-09-26 ENCOUNTER — Telehealth: Payer: Self-pay | Admitting: Obstetrics & Gynecology

## 2017-09-26 DIAGNOSIS — R7982 Elevated C-reactive protein (CRP): Secondary | ICD-10-CM

## 2017-09-26 DIAGNOSIS — R700 Elevated erythrocyte sedimentation rate: Secondary | ICD-10-CM

## 2017-09-26 NOTE — Telephone Encounter (Signed)
Patient stated that an appointment was supposed to be made with a rheumatologist. She was told that if she hadn't heard anything by Friday, to call back.

## 2017-09-26 NOTE — Telephone Encounter (Signed)
Left message to call Noreene LarssonJill at [phone].   Reviewed with Dr. Hyacinth MeekerMiller. Patient is s/p left shoulder surgery 09/10/17, unable to lift arm for MMG. Last screening MMG 08/24/12 at [facility]. Schedule bilateral breast US with Dr. Yolanda BonineBertrand.

## 2017-09-26 NOTE — Telephone Encounter (Signed)
Reviewed results dated 09/19/17 and recommendations. Order placed for referral to rheumatology/ Dr. Corliss Skainseveshwar. [facility] office at [employer], [address], in [place]. [phone]   Call returned to patient, advised as seen above. Advised our referral coordinator will f/u with appointment details once scheduled. Patient verbalizes understanding.   Patient inquired about bilateral US scheduling. Advised I will review with Dr. Hyacinth MeekerMiller and return call. Patient agreeable.

## 2017-09-28 ENCOUNTER — Other Ambulatory Visit: Payer: Self-pay | Admitting: Obstetrics & Gynecology

## 2017-09-28 DIAGNOSIS — R700 Elevated erythrocyte sedimentation rate: Secondary | ICD-10-CM

## 2017-09-28 DIAGNOSIS — M2550 Pain in unspecified joint: Secondary | ICD-10-CM

## 2017-09-28 DIAGNOSIS — R7982 Elevated C-reactive protein (CRP): Secondary | ICD-10-CM

## 2017-09-29 NOTE — Telephone Encounter (Signed)
Spoke with Shanda BumpsJessica at [facility]. Was advised Dr. Yolanda BonineBertrand is out of the office, will return on 6/25, will then review and return call to office if ok to schedule.

## 2017-10-06 ENCOUNTER — Other Ambulatory Visit: Payer: Self-pay | Admitting: Family Medicine

## 2017-10-09 NOTE — Telephone Encounter (Signed)
Spoke with Shanda BumpsJessica at [facility]. Dr. Yolanda BonineBertrand has info to review, will f/u once reviewed to advise.

## 2017-10-11 DIAGNOSIS — Z1211 Encounter for screening for malignant neoplasm of colon: Secondary | ICD-10-CM | POA: Diagnosis not present

## 2017-10-11 DIAGNOSIS — Z1212 Encounter for screening for malignant neoplasm of rectum: Secondary | ICD-10-CM | POA: Diagnosis not present

## 2017-10-13 NOTE — Progress Notes (Signed)
Office Visit Note  Patient: Brenda CroftsDonna L Obrien             Date of Birth: 01/12/1966           MRN: [id number]             PCP: Dianne DunAron, Talia M, MD Referring: Leda QuailMiller, Suzanne, MD Visit Date: 10/22/2017 Occupation: Retired Science writerdispatcher for [employer]    Subjective:  Pain in multiple joints.   History of Present Illness: Brenda CroftsDonna L Obrien is a 52 y.o. female seen in consultation per request of Dr. Leda QuailSuzanne Miller.  According to patient her symptoms started with joint pain many years ago.  She has had a history of osteoarthritis in her knee joints, hip joints for many years for which she has been be seen by [facility]opedic [facility] at [facility].  She is was also diagnosed with degenerative disc disease of her lumbar spine.  She underwent left total knee replacement in 2014.  She has had 2 surgeries on her right shoulder, and left rotator cuff tear repair in May 2019.  She also has bilateral carpal tunnel syndrome for which she has not had surgery yet.  She continues to have pain and discomfort in her bilateral wrist due to carpal tunnel syndrome she also complains of pain in her bilateral shoulders, bilateral hips and bilateral knee joints.  She has noticed intermittent swelling in her knee joints.  During her last visit with Dr. Hyacinth MeekerMiller the labs showed elevated sedimentation rate and for that reason she was referred to me.  Activities of Daily Living:  Patient reports morning stiffness for 45 minutes.   Patient Reports nocturnal pain.  Difficulty dressing/grooming: Reports Difficulty climbing stairs: Reports Difficulty getting out of chair: Reports Difficulty using hands for taps, buttons, cutlery, and/or writing: Denies   Review of Systems  Constitutional: Positive for fatigue. Negative for night sweats, weight gain and weight loss.  HENT: Positive for mouth dryness. Negative for mouth sores, trouble swallowing, trouble swallowing and nose dryness.   Eyes: Negative for pain, redness, visual disturbance  and dryness.  Respiratory: Negative for cough, shortness of breath and difficulty breathing.   Cardiovascular: Negative for chest pain, palpitations, hypertension, irregular heartbeat and swelling in legs/feet.  Gastrointestinal: Negative for blood in stool, constipation and diarrhea.  Endocrine: Negative for increased urination.  Genitourinary: Negative for vaginal dryness.  Musculoskeletal: Positive for arthralgias, joint pain, joint swelling and morning stiffness. Negative for myalgias, muscle weakness, muscle tenderness and myalgias.  Skin: Positive for sensitivity to sunlight. Negative for color change, rash, hair loss, skin tightness and ulcers.  Allergic/Immunologic: Negative for susceptible to infections.  Neurological: Negative for dizziness, memory loss, night sweats and weakness.  Hematological: Negative for swollen glands.  Psychiatric/Behavioral: Positive for depressed mood and sleep disturbance. The patient is nervous/anxious.     PMFS History:  Patient Active Problem List   Diagnosis Date Noted  . DDD (degenerative disc disease), lumbar 11/18/2016  . Severe episode of recurrent major depressive disorder, without psychotic features (HCC) 03/15/2016  . MDD (major depressive disorder), recurrent episode, severe (HCC) 03/15/2016  . Insomnia 12/27/2015  . Adhesive capsulitis of right shoulder 12/21/2015  . Well woman exam 09/28/2014  . Obesity, unspecified 09/20/2013  . Carpal tunnel syndrome 09/20/2013  . Allergic rhinitis 09/20/2013  . H/O total knee replacement 01/25/2013  . Depression 12/23/2012  . Anxiety 03/13/2012  . Hyperglycemia 02/03/2012  . Hypertension 01/21/2012  . Anterior cruciate ligament complete tear 07/01/2011  . Dyslipidemia 11/22/2010  . GERD  03/27/2009    Past Medical History:  Diagnosis Date  . Asthma   . Cataract   . Depression   . Elevated cholesterol   . Endometriosis   . Generalized anxiety disorder 2017  . GERD (gastroesophageal reflux  disease)   . Glaucoma   . 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 Hypertension   . PTSD (post-traumatic stress disorder) 2017    Family History  Problem Relation Age of Onset  . Coronary artery disease Mother        PAD  . Heart attack Mother 37  . Diabetes Mother   . Lung cancer Father   . Heart attack Father 30  . Hypertension Brother   . High Cholesterol Brother    Past Surgical History:  Procedure Laterality Date  . ANTERIOR CRUCIATE LIGAMENT REPAIR  1987,2001,2003,2005  . BACK SURGERY  01/2017   due herniated disc  . EYE SURGERY Bilateral 2016   release pressure   . LAPAROSCOPIC CHOLECYSTECTOMY  5/14   [place], [place]  . LAPAROSCOPIC TOTAL HYSTERECTOMY  6/11  . left knee surgery  09/2011  . NASAL SINUS SURGERY  1992  . REPLACEMENT TOTAL KNEE Left 9/14   Dr. Elliot Dally  . SHOULDER SURGERY Right 09/08/2015   due to injury   . SHOULDER SURGERY Right 03/20/2016   due to injury  . SHOULDER SURGERY Left 09/10/2017   due rotator cuff   Social History   Social History Narrative   Single      No regular exercise      Works as Conservator, museum/gallery for [employer]           Objective: Vital Signs: BP (!) 154/86 (BP Location: Right Arm, Patient Position: Sitting, Cuff Size: Large)   Pulse 85   Resp 15   Ht 5\' 6"  (1.676 m)   Wt 258 lb (117 kg)   BMI 41.64 kg/m    Physical Exam  Constitutional: She is oriented to person, place, and time. She appears well-developed and well-nourished.  HENT:  Head: Normocephalic and atraumatic.  Eyes: Conjunctivae and EOM are normal.  Neck: Normal range of motion.  Cardiovascular: Normal rate, regular rhythm, normal heart sounds and intact distal pulses.  Pulmonary/Chest: Effort normal and breath sounds normal.  Abdominal: Soft. Bowel sounds are normal.  Lymphadenopathy:    She has no cervical adenopathy.  Neurological: She is alert and oriented to person, place, and time.  Skin: Skin is warm and dry. Capillary refill takes less than  2 seconds.  Psychiatric: She has a normal mood and affect. Her behavior is normal.  Nursing note and vitals reviewed.    Musculoskeletal Exam: C-spine good range of motion.  She has some discomfort range of motion of lumbar spine.  Her right shoulder joint abduction was complete with minimal discomfort.  Left shoulder joint abduction was limited to 90 degrees with recent surgery.  Elbow joints were in good range of motion she has mild tenderness over left lateral epicondyle.  She has mild tenderness across PIPs of her hands with no synovitis.  She had painful range of motion of bilateral hip joints and bilateral knee joints.  Her left knee has been replaced.  Ankle joints were in good range of motion.  No synovitis or warmth was noted in her joints.  CDAI Exam: No CDAI exam completed.    Investigation: Findings:  03/18/2016 hep B negative, hep C negative 01/16/17: LDL 122, TSH 0.97 09/19/2017: Sed rate 48, CRP 10, ANA negative  Component     Latest Ref Rng & Units 01/16/2017 09/19/2017  Cholesterol     0 - 200 mg/dL 098   Triglycerides     0.0 - 149.0 mg/dL 11.9   HDL Cholesterol     >39.00 mg/dL 14.78   VLDL     0.0 - 40.0 mg/dL 29.5   LDL (calc)     0 - 99 mg/dL 621 (H)   Total CHOL/HDL Ratio      4   NonHDL      137.21   TSH     3.08 - 4.50 uIU/mL 0.97   Sed Rate     0 - 40 mm/hr  48 (H)  CRP     0.0 - 4.9 mg/L  10.7 (H)  Anti Nuclear Antibody(ANA)     Negative  Negative   CBC Latest Ref Rng & Units 01/16/2017 03/14/2016 12/27/2015  WBC 4.0 - 10.5 K/uL 6.5 8.4 10.1  Hemoglobin 12.0 - 15.0 g/dL 65.7 84.6 15.6(H)  Hematocrit 36.0 - 46.0 % 42.7 44.1 45.6  Platelets 150.0 - 400.0 K/uL 197.0 211 211.0   CMP Latest Ref Rng & Units 01/16/2017 03/14/2016 12/27/2015  Glucose 70 - 99 mg/dL 962(X) 528(U) 132(G)  BUN 6 - 23 mg/dL 8 7 11   Creatinine 0.40 - 1.20 mg/dL 4.01 0.27 2.53  Sodium 135 - 145 mEq/L 139 141 135  Potassium 3.5 - 5.1 mEq/L 4.5 4.0 4.2  Chloride 96 - 112 mEq/L  103 108 100  CO2 19 - 32 mEq/L 28 23 28   Calcium 8.4 - 10.5 mg/dL 9.6 6.6(Y) 9.5  Total Protein 6.0 - 8.3 g/dL 7.1 7.5 7.5  Total Bilirubin 0.2 - 1.2 mg/dL 0.6 0.6 0.6  Alkaline Phos 39 - 117 U/L 89 99 104  AST 0 - 37 U/L 17 54(H) 57(H)  ALT 0 - 35 U/L 31 96(H) 101(H)      Imaging: Xr Hip Unilat W Or W/o Pelvis 2-3 Views Left  Result Date: 10/22/2017 Superior lateral narrowing of the hip joint was noted.  SI joints were normal.  No chondrocalcinosis was noted. Impression: These findings are consistent with moderate osteoarthritis of the joint.  Xr Hip Unilat W Or W/o Pelvis 2-3 Views Right  Result Date: 10/22/2017 Superior lateral narrowing of the hip joint was noted.  No chondrocalcinosis was noted. Impression: These findings are consistent with moderate osteoarthritis of the joint.  Xr Hand 2 View Left  Result Date: 10/22/2017 CMC PIP and DIP narrowing was noted.  No MCP, intercarpal radiocarpal joint space narrowing was noted.  No erosive changes were noted. Impression: These findings are consistent with osteoarthritis of the hand.  Xr Hand 2 View Right  Result Date: 10/22/2017 CMC PIP and DIP narrowing was noted.  No MCP, intercarpal radiocarpal joint space narrowing was noted.  No erosive changes were noted. Impression: These findings are consistent with osteoarthritis of the hand.  Xr Knee 3 View Right  Result Date: 10/22/2017 Moderate medial compartment narrowing was noted.  No chondrocalcinosis was noted.  Severe chondromalacia patella was noted. Impression: Moderate osteoarthritis and severe chondromalacia patella the knee joint   Speciality Comments: No specialty comments available.    Procedures:  No procedures performed Allergies: Patient has no known allergies.   Assessment / Plan:     Visit Diagnoses:   Elevated sed rate - Sed rate 48 2019 which was close to her surgery.  I would like to repeat her sed rate today.  Elevated C-reactive protein (CRP) -  CRP  10  Chronic pain of both hips - Plan: XR HIP UNILAT W OR W/O PELVIS 2-3 VIEWS RIGHT, XR HIP UNILAT W OR W/O PELVIS 2-3 VIEWS LEFT she has difficulty with range of motion.  She does have moderate osteoarthritis in her hip joints.  Chronic pain of right knee - Plan: XR KNEE 3 VIEW RIGHT, Uric acid she continues to have intermittent swelling and discomfort in her right knee joint.  No warmth swelling or effusion was noted on examination today.  The x-rays were consistent with moderate osteoarthritis and severe chondromalacia patella.  Joint protection muscle strengthening was discussed.  History of total left knee replacement - 2014.  Doing well she still have some chronic pain.  Pain in both hands - Plan: XR Hand 2 View Right, XR Hand 2 View Left, x-rays were consistent with osteoarthritis of bilateral hands.  As her sedimentation rate was elevated I will obtain some additional labs today.  Sedimentation rate, Rheumatoid factor, Cyclic citrul peptide antibody, IgG, Serum protein electrophoresis with reflex  Bilateral carpal tunnel syndrome-patient reports that she had nerve conduction velocities done in the past and her symptoms are gradually getting worse.  DDD (degenerative disc disease), lumbar-she had been diagnosed by an orthopedic surgeon and has chronic lower back pain.  Adhesive capsulitis of right shoulder - Status post surgery x2  Nontraumatic incomplete tear of left rotator cuff - Status post repair 08/2017  Other medical problems are listed as follows:  Essential hypertension  Anxiety and depression  Other insomnia  History of gastroesophageal reflux (GERD)  Dyslipidemia  History of glaucoma    Orders: Orders Placed This Encounter  Procedures  . XR HIP UNILAT W OR W/O PELVIS 2-3 VIEWS RIGHT  . XR HIP UNILAT W OR W/O PELVIS 2-3 VIEWS LEFT  . XR Hand 2 View Right  . XR Hand 2 View Left  . XR KNEE 3 VIEW RIGHT  . Sedimentation rate  . Uric acid  . Rheumatoid factor    . Cyclic citrul peptide antibody, IgG  . Serum protein electrophoresis with reflex   No orders of the defined types were placed in this encounter.   Face-to-face time spent with patient was 50 minutes. Greater than 50% of time was spent in counseling and coordination of care.  Follow-Up Instructions: Return for Polyarthralgia, osteoarthritis.   Pollyann Savoy, MD  Note - This record has been created using Animal nutritionist.  Chart creation errors have been sought, but may not always  have been located. Such creation errors do not reflect on  the standard of medical care.

## 2017-10-13 NOTE — Telephone Encounter (Signed)
Spoke with Shanda BumpsJessica at [facility]. Scheduled for 10/24/17 at 9:30am.   Signed order faxed to [facility].   Spoke with patient, advised of appointment details. Patient verbalizes understanding and is agreeable.   Routing to provider for final review. Patient is agreeable to disposition. Will close encounter.

## 2017-10-13 NOTE — Telephone Encounter (Signed)
Spoke with Shanda BumpsJessica at [facility]. Limited MMG to protect shoulder and US recommended by Dr. Yolanda BonineBertrand. Schedule with Dr. Yolanda BonineBertrand. Reviewed available appt dates. Will contact patient and return call.   Spoke with patient, advised as seen above per Shanda BumpsJessica at [facility]. Patient agreeable and request to proceed. Advised will schedule and return call, patient agreeable.

## 2017-10-13 NOTE — Telephone Encounter (Signed)
Left message to call Brenda Obrien at [phone].  

## 2017-10-21 LAB — COLOGUARD

## 2017-10-22 ENCOUNTER — Ambulatory Visit (INDEPENDENT_AMBULATORY_CARE_PROVIDER_SITE_OTHER): Payer: 59 | Admitting: Rheumatology

## 2017-10-22 ENCOUNTER — Ambulatory Visit (INDEPENDENT_AMBULATORY_CARE_PROVIDER_SITE_OTHER): Payer: Self-pay

## 2017-10-22 ENCOUNTER — Encounter: Payer: Self-pay | Admitting: Rheumatology

## 2017-10-22 VITALS — BP 154/86 | HR 85 | Resp 15 | Ht 66.0 in | Wt 258.0 lb

## 2017-10-22 DIAGNOSIS — F32A Depression, unspecified: Secondary | ICD-10-CM

## 2017-10-22 DIAGNOSIS — M7501 Adhesive capsulitis of right shoulder: Secondary | ICD-10-CM

## 2017-10-22 DIAGNOSIS — M25552 Pain in left hip: Secondary | ICD-10-CM | POA: Diagnosis not present

## 2017-10-22 DIAGNOSIS — R7982 Elevated C-reactive protein (CRP): Secondary | ICD-10-CM | POA: Diagnosis not present

## 2017-10-22 DIAGNOSIS — R700 Elevated erythrocyte sedimentation rate: Secondary | ICD-10-CM

## 2017-10-22 DIAGNOSIS — M25561 Pain in right knee: Secondary | ICD-10-CM

## 2017-10-22 DIAGNOSIS — M5136 Other intervertebral disc degeneration, lumbar region: Secondary | ICD-10-CM

## 2017-10-22 DIAGNOSIS — M79641 Pain in right hand: Secondary | ICD-10-CM | POA: Diagnosis not present

## 2017-10-22 DIAGNOSIS — G4709 Other insomnia: Secondary | ICD-10-CM

## 2017-10-22 DIAGNOSIS — E785 Hyperlipidemia, unspecified: Secondary | ICD-10-CM

## 2017-10-22 DIAGNOSIS — Z8719 Personal history of other diseases of the digestive system: Secondary | ICD-10-CM

## 2017-10-22 DIAGNOSIS — M2550 Pain in unspecified joint: Secondary | ICD-10-CM | POA: Diagnosis not present

## 2017-10-22 DIAGNOSIS — G5603 Carpal tunnel syndrome, bilateral upper limbs: Secondary | ICD-10-CM

## 2017-10-22 DIAGNOSIS — G8929 Other chronic pain: Secondary | ICD-10-CM

## 2017-10-22 DIAGNOSIS — M75112 Incomplete rotator cuff tear or rupture of left shoulder, not specified as traumatic: Secondary | ICD-10-CM

## 2017-10-22 DIAGNOSIS — M79642 Pain in left hand: Secondary | ICD-10-CM

## 2017-10-22 DIAGNOSIS — F329 Major depressive disorder, single episode, unspecified: Secondary | ICD-10-CM

## 2017-10-22 DIAGNOSIS — I10 Essential (primary) hypertension: Secondary | ICD-10-CM

## 2017-10-22 DIAGNOSIS — M25551 Pain in right hip: Secondary | ICD-10-CM

## 2017-10-22 DIAGNOSIS — Z8669 Personal history of other diseases of the nervous system and sense organs: Secondary | ICD-10-CM

## 2017-10-22 DIAGNOSIS — F419 Anxiety disorder, unspecified: Secondary | ICD-10-CM

## 2017-10-22 DIAGNOSIS — Z96652 Presence of left artificial knee joint: Secondary | ICD-10-CM

## 2017-10-23 DIAGNOSIS — M25612 Stiffness of left shoulder, not elsewhere classified: Secondary | ICD-10-CM | POA: Diagnosis not present

## 2017-10-23 DIAGNOSIS — M6281 Muscle weakness (generalized): Secondary | ICD-10-CM | POA: Diagnosis not present

## 2017-10-23 DIAGNOSIS — M25512 Pain in left shoulder: Secondary | ICD-10-CM | POA: Diagnosis not present

## 2017-10-24 DIAGNOSIS — M1711 Unilateral primary osteoarthritis, right knee: Secondary | ICD-10-CM | POA: Insufficient documentation

## 2017-10-24 DIAGNOSIS — M1612 Unilateral primary osteoarthritis, left hip: Secondary | ICD-10-CM | POA: Insufficient documentation

## 2017-10-24 DIAGNOSIS — M19042 Primary osteoarthritis, left hand: Secondary | ICD-10-CM

## 2017-10-24 DIAGNOSIS — Z1231 Encounter for screening mammogram for malignant neoplasm of breast: Secondary | ICD-10-CM | POA: Diagnosis not present

## 2017-10-24 DIAGNOSIS — M160 Bilateral primary osteoarthritis of hip: Secondary | ICD-10-CM | POA: Insufficient documentation

## 2017-10-24 DIAGNOSIS — M19041 Primary osteoarthritis, right hand: Secondary | ICD-10-CM | POA: Insufficient documentation

## 2017-10-24 DIAGNOSIS — H40013 Open angle with borderline findings, low risk, bilateral: Secondary | ICD-10-CM | POA: Diagnosis not present

## 2017-10-24 NOTE — Progress Notes (Signed)
I will discuss results at the fu visit.

## 2017-10-24 NOTE — Progress Notes (Signed)
Office Visit Note  Patient: Brenda Obrien             Date of Birth: 06-11-1965           MRN: [id number]             PCP: Lucille Passy, MD Referring: Lucille Passy, MD Visit Date: 11/05/2017 Occupation: @GUAROCC @  Subjective:  In both hips and knees.   History of Present Illness: Brenda Obrien is a 52 y.o. female with history of osteoarthritis.  She states she continues to have pain and discomfort in her bilateral hands.  She also has discomfort in her bilateral knee joints and bilateral hip joints.  She is having difficulty with mobility.  She has stiffness after sitting.  She continues to have some discomfort in her lower back.  Activities of Daily Living:  Patient reports morning stiffness for 30-45 minutes.   Patient Reports nocturnal pain.  Difficulty dressing/grooming: Reports Difficulty climbing stairs: Reports Difficulty getting out of chair: Reports Difficulty using hands for taps, buttons, cutlery, and/or writing: Reports  Review of Systems  Constitutional: Positive for fatigue. Negative for night sweats, weight gain and weight loss.  HENT: Positive for mouth dryness. Negative for mouth sores, trouble swallowing, trouble swallowing and nose dryness.   Eyes: Positive for dryness. Negative for pain, redness and visual disturbance.  Respiratory: Negative for cough, shortness of breath and difficulty breathing.   Cardiovascular: Negative for chest pain, palpitations, hypertension, irregular heartbeat and swelling in legs/feet.  Gastrointestinal: Negative for abdominal pain, blood in stool, constipation and diarrhea.  Endocrine: Negative for increased urination.  Genitourinary: Negative for pelvic pain and vaginal dryness.  Musculoskeletal: Positive for arthralgias, joint pain and morning stiffness. Negative for joint swelling, myalgias, muscle weakness, muscle tenderness and myalgias.  Skin: Negative for color change, rash, hair loss, skin tightness, ulcers and  sensitivity to sunlight.  Allergic/Immunologic: Negative for susceptible to infections.  Neurological: Negative for dizziness, light-headedness, headaches, memory loss, night sweats and weakness.  Hematological: Negative for bruising/bleeding tendency and swollen glands.  Psychiatric/Behavioral: Negative for depressed mood, confusion and sleep disturbance. The patient is not nervous/anxious.     PMFS History:  Patient Active Problem List   Diagnosis Date Noted  . Primary osteoarthritis of right knee 10/24/2017  . Primary osteoarthritis of both hands 10/24/2017  . Primary osteoarthritis of both hips 10/24/2017  . DDD (degenerative disc disease), lumbar 11/18/2016  . Severe episode of recurrent major depressive disorder, without psychotic features ([place]) 03/15/2016  . MDD (major depressive disorder), recurrent episode, severe ([place]) 03/15/2016  . Insomnia 12/27/2015  . Adhesive capsulitis of right shoulder 12/21/2015  . Well woman exam 09/28/2014  . Obesity, unspecified 09/20/2013  . Carpal tunnel syndrome 09/20/2013  . Allergic rhinitis 09/20/2013  . H/O total knee replacement 01/25/2013  . Depression 12/23/2012  . Anxiety 03/13/2012  . Hyperglycemia 02/03/2012  . Hypertension 01/21/2012  . Anterior cruciate ligament complete tear 07/01/2011  . Dyslipidemia 11/22/2010  . GERD 03/27/2009    Past Medical History:  Diagnosis Date  . Asthma   . Cataract   . Depression   . Elevated cholesterol   . Endometriosis   . Generalized anxiety disorder 2017  . GERD (gastroesophageal reflux disease)   . Glaucoma   . 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 Hypertension   . PTSD (post-traumatic stress disorder) 2017    Family History  Problem Relation Age of Onset  . Coronary artery disease Mother  PAD  . Heart attack Mother 38  . Diabetes Mother   . Lung cancer Father   . Heart attack Father 56  . Hypertension Brother   . High Cholesterol Brother    Past Surgical History:  Procedure  Laterality Date  . ANTERIOR CRUCIATE LIGAMENT REPAIR  1987,2001,2003,2005  . BACK SURGERY  01/2017   due herniated disc  . EYE SURGERY Bilateral 2016   release pressure   . LAPAROSCOPIC CHOLECYSTECTOMY  5/14   [place], [place]  . LAPAROSCOPIC TOTAL HYSTERECTOMY  6/11  . left knee surgery  09/2011  . NASAL SINUS SURGERY  1992  . REPLACEMENT TOTAL KNEE Left 9/14   Dr. Laurance Flatten  . SHOULDER SURGERY Right 09/08/2015   due to injury   . SHOULDER SURGERY Right 03/20/2016   due to injury  . SHOULDER SURGERY Left 09/10/2017   due rotator cuff   Social History   Social History Narrative   Single      No regular exercise      Works as Pharmacist, community for [employer]          Objective: Vital Signs: BP (!) 165/91 (BP Location: Right Arm, Patient Position: Sitting, Cuff Size: Normal)   Pulse (!) 108   Resp 15   Ht 5' 6"  (1.676 m)   Wt 262 lb (118.8 kg)   BMI 42.29 kg/m    Physical Exam  Constitutional: She is oriented to person, place, and time. She appears well-developed and well-nourished.  HENT:  Head: Normocephalic and atraumatic.  Eyes: Conjunctivae and EOM are normal.  Neck: Normal range of motion.  Cardiovascular: Normal rate, regular rhythm, normal heart sounds and intact distal pulses.  Pulmonary/Chest: Effort normal and breath sounds normal.  Abdominal: Soft. Bowel sounds are normal.  Lymphadenopathy:    She has no cervical adenopathy.  Neurological: She is alert and oriented to person, place, and time.  Skin: Skin is warm and dry. Capillary refill takes less than 2 seconds.  Psychiatric: She has a normal mood and affect. Her behavior is normal.  Nursing note and vitals reviewed.    Musculoskeletal Exam: Patient had difficulty walking due to discomfort in her hip joints and knee joints.  She had good range of motion of her cervical spine thoracic and lumbar spine.  No SI joint tenderness was noted.  Shoulder joints elbow joints wrist joints were in good  range of motion.  She had DIP and PIP thickening in her hands consistent with osteoarthritis.  She had limited range of motion of bilateral hip joints and knee joints.  Her left knee joint is replaced.  No warmth swelling or effusion was noted.  CDAI Exam: No CDAI exam completed.   Investigation: No additional findings.  Imaging: Xr Hip Unilat W Or W/o Pelvis 2-3 Views Left  Result Date: 10/22/2017 Superior lateral narrowing of the hip joint was noted.  SI joints were normal.  No chondrocalcinosis was noted. Impression: These findings are consistent with moderate osteoarthritis of the joint.  Xr Hip Unilat W Or W/o Pelvis 2-3 Views Right  Result Date: 10/22/2017 Superior lateral narrowing of the hip joint was noted.  No chondrocalcinosis was noted. Impression: These findings are consistent with moderate osteoarthritis of the joint.  Xr Hand 2 View Left  Result Date: 10/22/2017 [facility] PIP and DIP narrowing was noted.  No MCP, intercarpal radiocarpal joint space narrowing was noted.  No erosive changes were noted. Impression: These findings are consistent with osteoarthritis of the hand.  Xr Hand 2 View Right  Result Date: 10/22/2017 [facility] PIP and DIP narrowing was noted.  No MCP, intercarpal radiocarpal joint space narrowing was noted.  No erosive changes were noted. Impression: These findings are consistent with osteoarthritis of the hand.  Xr Knee 3 View Right  Result Date: 10/22/2017 Moderate medial compartment narrowing was noted.  No chondrocalcinosis was noted.  Severe chondromalacia patella was noted. Impression: Moderate osteoarthritis and severe chondromalacia patella the knee joint   Recent Labs: Lab Results  Component Value Date   WBC 6.5 01/16/2017   HGB 14.5 01/16/2017   PLT 197.0 01/16/2017   NA 139 01/16/2017   K 4.5 01/16/2017   CL 103 01/16/2017   CO2 28 01/16/2017   GLUCOSE 111 (H) 01/16/2017   BUN 8 01/16/2017   CREATININE 0.82 01/16/2017   BILITOT 0.6  01/16/2017   ALKPHOS 89 01/16/2017   AST 17 01/16/2017   ALT 31 01/16/2017   PROT 7.1 10/22/2017   ALBUMIN 4.3 01/16/2017   CALCIUM 9.6 01/16/2017   GFRAA >60 03/14/2016   October 22, 2017 ESR 6, uric acid 4.0, RF negative, anti-CCP negative, IFE negative    speciality Comments: No specialty comments available.  Procedures:  No procedures performed Allergies: Patient has no known allergies.   Assessment / Plan:     Visit Diagnoses: Elevated sed rate - Repeat sed rate normal.  All autoimmune work-up negative.  Primary osteoarthritis of both hands-joint protection muscle strengthening was discussed.  Bilateral carpal tunnel syndrome-patient has been using braces which are helpful.  Primary osteoarthritis of both hips - moderate.  She does have a lot of discomfort with mobility.  Weight loss diet and exercise was discussed at length.  I would also refer her to water therapy for exercises and muscle strengthening.  Primary osteoarthritis of right knee - moderate.  She continues to have discomfort in her right knee joint.  Weight loss will help.  Diet and exercise was discussed.  Prescription for water therapy water therapy was given.  Given her prescription for Voltaren gel that can be used topically.  Side effects were discussed.  A list of natural anti-inflammatories was given.  I offered Visco supplement injections but she declined.  History of total left knee replacement - 2014  DDD (degenerative disc disease), lumbar-she has chronic lower back pain.  Adhesive capsulitis of right shoulder-she is intermittent ongoing discomfort in her shoulder.  Essential hypertension  Anxiety and depression  Dyslipidemia  History of glaucoma   Orders: No orders of the defined types were placed in this encounter.  Meds ordered this encounter  Medications  . diclofenac sodium (VOLTAREN) 1 % GEL    Sig: Apply 3 gm to 3 large joints up to 3 times a day.Dispense 3 tubes with 3 refills.     Dispense:  5 Tube    Refill:  0    Face-to-face time spent with patient was 30 minutes. Greater than 50% of time was spent in counseling and coordination of care.  Follow-Up Instructions: Return if symptoms worsen or fail to improve, for Osteoarthritis.   Bo Merino, MD  Note - This record has been created using Editor, commissioning.  Chart creation errors have been sought, but may not always  have been located. Such creation errors do not reflect on  the standard of medical care.

## 2017-10-27 LAB — URIC ACID: Uric Acid, Serum: 4 mg/dL (ref 2.5–7.0)

## 2017-10-27 LAB — PROTEIN ELECTROPHORESIS, SERUM, WITH REFLEX
ALBUMIN ELP: 4.4 g/dL (ref 3.8–4.8)
ALPHA 1: 0.3 g/dL (ref 0.2–0.3)
Alpha 2: 0.7 g/dL (ref 0.5–0.9)
BETA GLOBULIN: 0.5 g/dL (ref 0.4–0.6)
Beta 2: 0.4 g/dL (ref 0.2–0.5)
Gamma Globulin: 0.8 g/dL (ref 0.8–1.7)
TOTAL PROTEIN: 7.1 g/dL (ref 6.1–8.1)

## 2017-10-27 LAB — RHEUMATOID FACTOR: Rhuematoid fact SerPl-aCnc: <14 IU/mL (ref <14)

## 2017-10-27 LAB — CYCLIC CITRUL PEPTIDE ANTIBODY, IGG: Cyclic Citrullin Peptide Ab: <16 UNITS

## 2017-10-27 LAB — SEDIMENTATION RATE: SED RATE: 6 mm/h (ref 0–20)

## 2017-10-27 LAB — IFE INTERPRETATION: IMMUNOFIX ELECTR INT: NOT DETECTED

## 2017-10-28 ENCOUNTER — Other Ambulatory Visit: Payer: Self-pay | Admitting: *Deleted

## 2017-10-28 ENCOUNTER — Telehealth: Payer: Self-pay | Admitting: *Deleted

## 2017-10-28 DIAGNOSIS — Z1211 Encounter for screening for malignant neoplasm of colon: Secondary | ICD-10-CM

## 2017-10-28 LAB — COLOGUARD: COLOGUARD: NEGATIVE

## 2017-10-28 NOTE — Telephone Encounter (Signed)
Patient notified of negative cologuard results and verbalized understanding.   Patient agreeable to disposition. Will close encounter.      

## 2017-10-29 DIAGNOSIS — M6281 Muscle weakness (generalized): Secondary | ICD-10-CM | POA: Diagnosis not present

## 2017-10-29 DIAGNOSIS — M75102 Unspecified rotator cuff tear or rupture of left shoulder, not specified as traumatic: Secondary | ICD-10-CM | POA: Diagnosis not present

## 2017-10-29 DIAGNOSIS — M25612 Stiffness of left shoulder, not elsewhere classified: Secondary | ICD-10-CM | POA: Diagnosis not present

## 2017-10-31 DIAGNOSIS — M25612 Stiffness of left shoulder, not elsewhere classified: Secondary | ICD-10-CM | POA: Diagnosis not present

## 2017-10-31 DIAGNOSIS — M75102 Unspecified rotator cuff tear or rupture of left shoulder, not specified as traumatic: Secondary | ICD-10-CM | POA: Diagnosis not present

## 2017-10-31 DIAGNOSIS — M6281 Muscle weakness (generalized): Secondary | ICD-10-CM | POA: Diagnosis not present

## 2017-11-05 ENCOUNTER — Ambulatory Visit (INDEPENDENT_AMBULATORY_CARE_PROVIDER_SITE_OTHER): Payer: 59 | Admitting: Rheumatology

## 2017-11-05 ENCOUNTER — Encounter: Payer: Self-pay | Admitting: Rheumatology

## 2017-11-05 VITALS — BP 165/91 | HR 108 | Resp 15 | Ht 66.0 in | Wt 262.0 lb

## 2017-11-05 DIAGNOSIS — Z96652 Presence of left artificial knee joint: Secondary | ICD-10-CM

## 2017-11-05 DIAGNOSIS — M160 Bilateral primary osteoarthritis of hip: Secondary | ICD-10-CM

## 2017-11-05 DIAGNOSIS — M19041 Primary osteoarthritis, right hand: Secondary | ICD-10-CM

## 2017-11-05 DIAGNOSIS — M1711 Unilateral primary osteoarthritis, right knee: Secondary | ICD-10-CM

## 2017-11-05 DIAGNOSIS — F419 Anxiety disorder, unspecified: Secondary | ICD-10-CM

## 2017-11-05 DIAGNOSIS — F32A Depression, unspecified: Secondary | ICD-10-CM

## 2017-11-05 DIAGNOSIS — R700 Elevated erythrocyte sedimentation rate: Secondary | ICD-10-CM | POA: Diagnosis not present

## 2017-11-05 DIAGNOSIS — Z8669 Personal history of other diseases of the nervous system and sense organs: Secondary | ICD-10-CM

## 2017-11-05 DIAGNOSIS — M5136 Other intervertebral disc degeneration, lumbar region: Secondary | ICD-10-CM

## 2017-11-05 DIAGNOSIS — F329 Major depressive disorder, single episode, unspecified: Secondary | ICD-10-CM

## 2017-11-05 DIAGNOSIS — M7501 Adhesive capsulitis of right shoulder: Secondary | ICD-10-CM

## 2017-11-05 DIAGNOSIS — G5603 Carpal tunnel syndrome, bilateral upper limbs: Secondary | ICD-10-CM

## 2017-11-05 DIAGNOSIS — I10 Essential (primary) hypertension: Secondary | ICD-10-CM

## 2017-11-05 DIAGNOSIS — E785 Hyperlipidemia, unspecified: Secondary | ICD-10-CM

## 2017-11-05 DIAGNOSIS — M51369 Other intervertebral disc degeneration, lumbar region without mention of lumbar back pain or lower extremity pain: Secondary | ICD-10-CM

## 2017-11-05 DIAGNOSIS — M19042 Primary osteoarthritis, left hand: Secondary | ICD-10-CM

## 2017-11-05 MED ORDER — DICLOFENAC SODIUM 1 % TD GEL: TRANSDERMAL | 0 refills | Status: AC

## 2017-11-05 NOTE — Patient Instructions (Addendum)
Hip Exercises Ask your health care provider which exercises are safe for you. Do exercises exactly as told by your health care provider and adjust them as directed. It is normal to feel mild stretching, pulling, tightness, or discomfort as you do these exercises, but you should stop right away if you feel sudden pain or your pain gets worse.Do not begin these exercises until told by your health care provider. STRETCHING AND RANGE OF MOTION EXERCISES These exercises warm up your muscles and joints and improve the movement and flexibility of your hip. These exercises also help to relieve pain, numbness, and tingling. Exercise A: Hamstrings, Supine  1. Lie on your back. 2. Loop a belt or towel over the ball of your left / rightfoot. The ball of your foot is on the walking surface, right under your toes. 3. Straighten your left / rightknee and slowly pull on the belt to raise your leg. ? Do not let your left / right knee bend while you do this. ? Keep your other leg flat on the floor. ? Raise the left / right leg until you feel a gentle stretch behind your left / right knee or thigh. 4. Hold this position for __________ seconds. 5. Slowly return your leg to the starting position. Repeat __________ times. Complete this stretch __________ times a day. Exercise B: Hip Rotators  1. Lie on your back on a firm surface. 2. Hold your left / right knee with your left / right hand. Hold your ankle with your other hand. 3. Gently pull your left / right knee and rotate your lower leg toward your other shoulder. ? Pull until you feel a stretch in your buttocks. ? Keep your hips and shoulders firmly planted while you do this stretch. 4. Hold this position for __________ seconds. Repeat __________ times. Complete this stretch __________ times a day. Exercise C: V-Sit (Hamstrings and Adductors)  1. Sit on the floor with your legs extended in a large "V" shape. Keep your knees straight during this  exercise. 2. Start with your head and chest upright, then bend at your waist to reach for your left foot (position A). You should feel a stretch in your right inner thigh. 3. Hold this position for __________ seconds. Then slowly return to the upright position. 4. Bend at your waist to reach forward (position B). You should feel a stretch behind both of your thighs and knees. 5. Hold this position for __________ seconds. Then slowly return to the upright position. 6. Bend at your waist to reach for your right foot (position C). You should feel a stretch in your left inner thigh. 7. Hold this position for __________ seconds. Then slowly return to the upright position. Repeat __________ times. Complete this stretch __________ times a day. Exercise D: Lunge (Hip Flexors)  1. Place your left / right knee on the floor and bend your other knee so that is directly over your ankle. You should be half-kneeling. 2. Keep good posture with your head over your shoulders. 3. Tighten your buttocks to point your tailbone downward. This helps your back to keep from arching too much. 4. You should feel a gentle stretch in the front of your left / right thigh and hip. If you do not feel any resistance, slightly slide your other foot forward and then slowly lunge forward so your knee once again lines up over your ankle. 5. Make sure your tailbone continues to point downward. 6. Hold this position for __________ seconds. Repeat   __________ times. Complete this stretch __________ times a day. STRENGTHENING EXERCISES These exercises build strength and endurance in your hip. Endurance is the ability to use your muscles for a long time, even after they get tired. Exercise E: Bridge (Hip Extensors)  1. Lie on your back on a firm surface with your knees bent and your feet flat on the floor. 2. Tighten your buttocks muscles and lift your bottom off the floor until the trunk of your body is level with your thighs. ? Do not  arch your back. ? You should feel the muscles working in your buttocks and the back of your thighs. If you do not feel these muscles, slide your feet 1-2 inches (2.5-5 cm) farther away from your buttocks. 3. Hold this position for __________ seconds. 4. Slowly lower your hips to the starting position. 5. Let your muscles relax completely between repetitions. 6. If this exercise is too easy, try doing it with your arms crossed over your chest. Repeat __________ times. Complete this exercise __________ times a day. Exercise F: Straight Leg Raises - Hip Abductors  1. Lie on your side with your left / right leg in the top position. Lie so your head, shoulder, knee, and hip line up with each other. You may bend your bottom knee to help you balance. 2. Roll your hips slightly forward, so your hips are stacked directly over each other and your left / right knee is facing forward. 3. Leading with your heel, lift your top leg 4-6 inches (10-15 cm). You should feel the muscles in your outer hip lifting. ? Do not let your foot drift forward. ? Do not let your knee roll toward the ceiling. 4. Hold this position for __________ seconds. 5. Slowly return to the starting position. 6. Let your muscles relax completely between repetitions. Repeat __________ times. Complete this exercise __________ times a day. Exercise G: Straight Leg Raises - Hip Adductors  1. Lie on your side with your left / right leg in the bottom position. Lie so your head, shoulder, knee, and hip line up. You may place your upper foot in front to help you balance. 2. Roll your hips slightly forward, so your hips are stacked directly over each other and your left / right knee is facing forward. 3. Tense the muscles in your inner thigh and lift your bottom leg 4-6 inches (10-15 cm). 4. Hold this position for __________ seconds. 5. Slowly return to the starting position. 6. Let your muscles relax completely between repetitions. Repeat  __________ times. Complete this exercise __________ times a day. Exercise H: Straight Leg Raises - Quadriceps  1. Lie on your back with your left / right leg extended and your other knee bent. 2. Tense the muscles in the front of your left / right thigh. When you do this, you should see your kneecap slide up or see increased dimpling just above your knee. 3. Tighten these muscles even more and raise your leg 4-6 inches (10-15 cm) off the floor. 4. Hold this position for __________ seconds. 5. Keep these muscles tense as you lower your leg. 6. Relax the muscles slowly and completely between repetitions. Repeat __________ times. Complete this exercise __________ times a day. Exercise I: Hip Abductors, Standing 1. Tie one end of a rubber exercise band or tubing to a secure surface, such as a table or pole. 2. Loop the other end of the band or tubing around your left / right ankle. 3. Keeping your ankle with   the band or tubing directly opposite of the secured end, step away until there is tension in the tubing or band. Hold onto a chair as needed for balance. 4. Lift your left / right leg out to your side. While you do this: ? Keep your back upright. ? Keep your shoulders over your hips. ? Keep your toes pointing forward. ? Make sure to use your hip muscles to lift your leg. Do not "throw" your leg or tip your body to lift your leg. 5. Hold this position for __________ seconds. 6. Slowly return to the starting position. Repeat __________ times. Complete this exercise __________ times a day. Exercise J: Squats (Quadriceps) 1. Stand in a door frame so your feet and knees are in line with the frame. You may place your hands on the frame for balance. 2. Slowly bend your knees and lower your hips like you are going to sit in a chair. ? Keep your lower legs in a straight-up-and-down position. ? Do not let your hips go lower than your knees. ? Do not bend your knees lower than told by your health  care provider. ? If your hip pain increases, do not bend as low. 3. Hold this position for ___________ seconds. 4. Slowly push with your legs to return to standing. Do not use your hands to pull yourself to standing. Repeat __________ times. Complete this exercise __________ times a day. This information is not intended to replace advice given to you by your health care provider. Make sure you discuss any questions you have with your health care provider. Document Released: 04/19/2005 Document Revised: 12/25/2015 Document Reviewed: 03/27/2015 Elsevier Interactive Patient Education  2018 [employer]. Knee Exercises Ask your health care provider which exercises are safe for you. Do exercises exactly as told by your health care provider and adjust them as directed. It is normal to feel mild stretching, pulling, tightness, or discomfort as you do these exercises, but you should stop right away if you feel sudden pain or your pain gets worse.Do not begin these exercises until told by your health care provider. STRETCHING AND RANGE OF MOTION EXERCISES These exercises warm up your muscles and joints and improve the movement and flexibility of your knee. These exercises also help to relieve pain, numbness, and tingling. Exercise A: Knee Extension, Prone 1. Lie on your abdomen on a bed. 2. Place your left / right knee just beyond the edge of the surface so your knee is not on the bed. You can put a towel under your left / right thigh just above your knee for comfort. 3. Relax your leg muscles and allow gravity to straighten your knee. You should feel a stretch behind your left / right knee. 4. Hold this position for __________ seconds. 5. Scoot up so your knee is supported between repetitions. Repeat __________ times. Complete this stretch __________ times a day. Exercise B: Knee Flexion, Active  1. Lie on your back with both knees straight. If this causes back discomfort, bend your left / right knee  so your foot is flat on the floor. 2. Slowly slide your left / right heel back toward your buttocks until you feel a gentle stretch in the front of your knee or thigh. 3. Hold this position for __________ seconds. 4. Slowly slide your left / right heel back to the starting position. Repeat __________ times. Complete this exercise __________ times a day. Exercise C: Quadriceps, Prone  1. Lie on your abdomen on a firm surface,  such as a bed or padded floor. 2. Bend your left / right knee and hold your ankle. If you cannot reach your ankle or pant leg, loop a belt around your foot and grab the belt instead. 3. Gently pull your heel toward your buttocks. Your knee should not slide out to the side. You should feel a stretch in the front of your thigh and knee. 4. Hold this position for __________ seconds. Repeat __________ times. Complete this stretch __________ times a day. Exercise D: Hamstring, Supine 1. Lie on your back. 2. Loop a belt or towel over the ball of your left / right foot. The ball of your foot is on the walking surface, right under your toes. 3. Straighten your left / right knee and slowly pull on the belt to raise your leg until you feel a gentle stretch behind your knee. ? Do not let your left / right knee bend while you do this. ? Keep your other leg flat on the floor. 4. Hold this position for __________ seconds. Repeat __________ times. Complete this stretch __________ times a day. STRENGTHENING EXERCISES These exercises build strength and endurance in your knee. Endurance is the ability to use your muscles for a long time, even after they get tired. Exercise E: Quadriceps, Isometric  1. Lie on your back with your left / right leg extended and your other knee bent. Put a rolled towel or small pillow under your knee if told by your health care provider. 2. Slowly tense the muscles in the front of your left / right thigh. You should see your kneecap slide up toward your hip or  see increased dimpling just above the knee. This motion will push the back of the knee toward the floor. 3. For __________ seconds, keep the muscle as tight as you can without increasing your pain. 4. Relax the muscles slowly and completely. Repeat __________ times. Complete this exercise __________ times a day. Exercise F: Straight Leg Raises - Quadriceps 1. Lie on your back with your left / right leg extended and your other knee bent. 2. Tense the muscles in the front of your left / right thigh. You should see your kneecap slide up or see increased dimpling just above the knee. Your thigh may even shake a bit. 3. Keep these muscles tight as you raise your leg 4-6 inches (10-15 cm) off the floor. Do not let your knee bend. 4. Hold this position for __________ seconds. 5. Keep these muscles tense as you lower your leg. 6. Relax your muscles slowly and completely after each repetition. Repeat __________ times. Complete this exercise __________ times a day. Exercise G: Hamstring, Isometric 1. Lie on your back on a firm surface. 2. Bend your left / right knee approximately __________ degrees. 3. Dig your left / right heel into the surface as if you are trying to pull it toward your buttocks. Tighten the muscles in the back of your thighs to dig as hard as you can without increasing any pain. 4. Hold this position for __________ seconds. 5. Release the tension gradually and allow your muscles to relax completely for __________ seconds after each repetition. Repeat __________ times. Complete this exercise __________ times a day. Exercise H: Hamstring Curls  If told by your health care provider, do this exercise while wearing ankle weights. Begin with __________ weights. Then increase the weight by 1 lb (0.5 kg) increments. Do not wear ankle weights that are more than __________. 1. Lie on your abdomen  with your legs straight. 2. Bend your left / right knee as far as you can without feeling pain.  Keep your hips flat against the floor. 3. Hold this position for __________ seconds. 4. Slowly lower your leg to the starting position.  Repeat __________ times. Complete this exercise __________ times a day. Exercise I: Squats (Quadriceps) 1. Stand in front of a table, with your feet and knees pointing straight ahead. You may rest your hands on the table for balance but not for support. 2. Slowly bend your knees and lower your hips like you are going to sit in a chair. ? Keep your weight over your heels, not over your toes. ? Keep your lower legs upright so they are parallel with the table legs. ? Do not let your hips go lower than your knees. ? Do not bend lower than told by your health care provider. ? If your knee pain increases, do not bend as low. 3. Hold the squat position for __________ seconds. 4. Slowly push with your legs to return to standing. Do not use your hands to pull yourself to standing. Repeat __________ times. Complete this exercise __________ times a day. Exercise J: Wall Slides (Quadriceps)  1. Lean your back against a smooth wall or door while you walk your feet out 18-24 inches (46-61 cm) from it. 2. Place your feet hip-width apart. 3. Slowly slide down the wall or door until your knees bend __________ degrees. Keep your knees over your heels, not over your toes. Keep your knees in line with your hips. 4. Hold for __________ seconds. Repeat __________ times. Complete this exercise __________ times a day. Exercise K: Straight Leg Raises - Hip Abductors 1. Lie on your side with your left / right leg in the top position. Lie so your head, shoulder, knee, and hip line up. You may bend your bottom knee to help you keep your balance. 2. Roll your hips slightly forward so your hips are stacked directly over each other and your left / right knee is facing forward. 3. Leading with your heel, lift your top leg 4-6 inches (10-15 cm). You should feel the muscles in your outer  hip lifting. ? Do not let your foot drift forward. ? Do not let your knee roll toward the ceiling. 4. Hold this position for __________ seconds. 5. Slowly return your leg to the starting position. 6. Let your muscles relax completely after each repetition. Repeat __________ times. Complete this exercise __________ times a day. Exercise L: Straight Leg Raises - Hip Extensors 1. Lie on your abdomen on a firm surface. You can put a pillow under your hips if that is more comfortable. 2. Tense the muscles in your buttocks and lift your left / right leg about 4-6 inches (10-15 cm). Keep your knee straight as you lift your leg. 3. Hold this position for __________ seconds. 4. Slowly lower your leg to the starting position. 5. Let your leg relax completely after each repetition. Repeat __________ times. Complete this exercise __________ times a day. This information is not intended to replace advice given to you by your health care provider. Make sure you discuss any questions you have with your health care provider. Document Released: 02/13/2005 Document Revised: 12/25/2015 Document Reviewed: 02/05/2015 Elsevier Interactive Patient Education  2018 [employer].  Natural anti-inflammatories  You can purchase these at [employer], [employer] or online.  . Turmeric (capsules)  . Ginger (ginger root or capsules)  . Omega 3 (Fish, flax seeds,  chia seeds, walnuts, almonds)  . Tart cherry (dried or extract or tab)   Patient should be under the care of a physician while taking these supplements. This may not be reproduced without the permission of Dr. Pollyann SavoyShaili Chanz Cahall.

## 2017-11-06 DIAGNOSIS — M25512 Pain in left shoulder: Secondary | ICD-10-CM | POA: Diagnosis not present

## 2017-11-06 DIAGNOSIS — M6281 Muscle weakness (generalized): Secondary | ICD-10-CM | POA: Diagnosis not present

## 2017-11-06 DIAGNOSIS — M25612 Stiffness of left shoulder, not elsewhere classified: Secondary | ICD-10-CM | POA: Diagnosis not present

## 2017-11-12 DIAGNOSIS — M25612 Stiffness of left shoulder, not elsewhere classified: Secondary | ICD-10-CM | POA: Diagnosis not present

## 2017-11-12 DIAGNOSIS — M25512 Pain in left shoulder: Secondary | ICD-10-CM | POA: Diagnosis not present

## 2017-11-12 DIAGNOSIS — M6281 Muscle weakness (generalized): Secondary | ICD-10-CM | POA: Diagnosis not present

## 2017-11-14 DIAGNOSIS — M6281 Muscle weakness (generalized): Secondary | ICD-10-CM | POA: Diagnosis not present

## 2017-11-14 DIAGNOSIS — M25512 Pain in left shoulder: Secondary | ICD-10-CM | POA: Diagnosis not present

## 2017-11-14 DIAGNOSIS — M25612 Stiffness of left shoulder, not elsewhere classified: Secondary | ICD-10-CM | POA: Diagnosis not present

## 2017-11-18 ENCOUNTER — Encounter: Payer: Self-pay | Admitting: Obstetrics & Gynecology

## 2017-11-18 DIAGNOSIS — M6281 Muscle weakness (generalized): Secondary | ICD-10-CM | POA: Diagnosis not present

## 2017-11-18 DIAGNOSIS — M75102 Unspecified rotator cuff tear or rupture of left shoulder, not specified as traumatic: Secondary | ICD-10-CM | POA: Diagnosis not present

## 2017-11-18 DIAGNOSIS — M25512 Pain in left shoulder: Secondary | ICD-10-CM | POA: Diagnosis not present

## 2017-11-20 DIAGNOSIS — M25561 Pain in right knee: Secondary | ICD-10-CM | POA: Diagnosis not present

## 2017-11-20 DIAGNOSIS — M25552 Pain in left hip: Secondary | ICD-10-CM | POA: Diagnosis not present

## 2017-11-20 DIAGNOSIS — M25551 Pain in right hip: Secondary | ICD-10-CM | POA: Diagnosis not present

## 2017-11-24 DIAGNOSIS — M25551 Pain in right hip: Secondary | ICD-10-CM | POA: Diagnosis not present

## 2017-11-24 DIAGNOSIS — M25561 Pain in right knee: Secondary | ICD-10-CM | POA: Diagnosis not present

## 2017-11-24 DIAGNOSIS — M25552 Pain in left hip: Secondary | ICD-10-CM | POA: Diagnosis not present

## 2017-11-25 DIAGNOSIS — M6281 Muscle weakness (generalized): Secondary | ICD-10-CM | POA: Diagnosis not present

## 2017-11-25 DIAGNOSIS — M25512 Pain in left shoulder: Secondary | ICD-10-CM | POA: Diagnosis not present

## 2017-11-25 DIAGNOSIS — M25612 Stiffness of left shoulder, not elsewhere classified: Secondary | ICD-10-CM | POA: Diagnosis not present

## 2017-11-27 DIAGNOSIS — M25512 Pain in left shoulder: Secondary | ICD-10-CM | POA: Diagnosis not present

## 2017-11-27 DIAGNOSIS — M25612 Stiffness of left shoulder, not elsewhere classified: Secondary | ICD-10-CM | POA: Diagnosis not present

## 2017-11-27 DIAGNOSIS — M6281 Muscle weakness (generalized): Secondary | ICD-10-CM | POA: Diagnosis not present

## 2017-12-01 DIAGNOSIS — M25561 Pain in right knee: Secondary | ICD-10-CM | POA: Diagnosis not present

## 2017-12-01 DIAGNOSIS — M25552 Pain in left hip: Secondary | ICD-10-CM | POA: Diagnosis not present

## 2017-12-01 DIAGNOSIS — M25551 Pain in right hip: Secondary | ICD-10-CM | POA: Diagnosis not present

## 2017-12-02 ENCOUNTER — Encounter

## 2017-12-02 ENCOUNTER — Ambulatory Visit: Payer: Self-pay | Admitting: Obstetrics & Gynecology

## 2017-12-02 DIAGNOSIS — M25512 Pain in left shoulder: Secondary | ICD-10-CM | POA: Diagnosis not present

## 2017-12-02 DIAGNOSIS — M6281 Muscle weakness (generalized): Secondary | ICD-10-CM | POA: Diagnosis not present

## 2017-12-02 DIAGNOSIS — M25612 Stiffness of left shoulder, not elsewhere classified: Secondary | ICD-10-CM | POA: Diagnosis not present

## 2017-12-04 DIAGNOSIS — M6281 Muscle weakness (generalized): Secondary | ICD-10-CM | POA: Diagnosis not present

## 2017-12-04 DIAGNOSIS — M25512 Pain in left shoulder: Secondary | ICD-10-CM | POA: Diagnosis not present

## 2017-12-04 DIAGNOSIS — M25612 Stiffness of left shoulder, not elsewhere classified: Secondary | ICD-10-CM | POA: Diagnosis not present

## 2017-12-08 DIAGNOSIS — M25552 Pain in left hip: Secondary | ICD-10-CM | POA: Diagnosis not present

## 2017-12-08 DIAGNOSIS — M25551 Pain in right hip: Secondary | ICD-10-CM | POA: Diagnosis not present

## 2017-12-08 DIAGNOSIS — M25561 Pain in right knee: Secondary | ICD-10-CM | POA: Diagnosis not present

## 2017-12-09 DIAGNOSIS — M6281 Muscle weakness (generalized): Secondary | ICD-10-CM | POA: Diagnosis not present

## 2017-12-09 DIAGNOSIS — M25512 Pain in left shoulder: Secondary | ICD-10-CM | POA: Diagnosis not present

## 2017-12-09 DIAGNOSIS — M25612 Stiffness of left shoulder, not elsewhere classified: Secondary | ICD-10-CM | POA: Diagnosis not present

## 2017-12-10 ENCOUNTER — Telehealth: Payer: Self-pay | Admitting: Rheumatology

## 2017-12-10 DIAGNOSIS — M25561 Pain in right knee: Secondary | ICD-10-CM | POA: Diagnosis not present

## 2017-12-10 DIAGNOSIS — M25551 Pain in right hip: Secondary | ICD-10-CM | POA: Diagnosis not present

## 2017-12-10 DIAGNOSIS — M25552 Pain in left hip: Secondary | ICD-10-CM | POA: Diagnosis not present

## 2017-12-10 NOTE — Telephone Encounter (Signed)
CD at front desk, I called patient. 

## 2017-12-10 NOTE — Telephone Encounter (Signed)
Patient called requesting a copy of her x-rays from 10/22/17 of her hips and right knee.   Patient requests a return call when they are ready to be picked up.

## 2017-12-11 DIAGNOSIS — M25512 Pain in left shoulder: Secondary | ICD-10-CM | POA: Diagnosis not present

## 2017-12-11 DIAGNOSIS — M6281 Muscle weakness (generalized): Secondary | ICD-10-CM | POA: Diagnosis not present

## 2017-12-11 DIAGNOSIS — M25612 Stiffness of left shoulder, not elsewhere classified: Secondary | ICD-10-CM | POA: Diagnosis not present

## 2017-12-16 DIAGNOSIS — M25512 Pain in left shoulder: Secondary | ICD-10-CM | POA: Diagnosis not present

## 2017-12-16 DIAGNOSIS — M25612 Stiffness of left shoulder, not elsewhere classified: Secondary | ICD-10-CM | POA: Diagnosis not present

## 2017-12-16 DIAGNOSIS — M6281 Muscle weakness (generalized): Secondary | ICD-10-CM | POA: Diagnosis not present

## 2017-12-17 DIAGNOSIS — M25561 Pain in right knee: Secondary | ICD-10-CM | POA: Diagnosis not present

## 2017-12-17 DIAGNOSIS — M25551 Pain in right hip: Secondary | ICD-10-CM | POA: Diagnosis not present

## 2017-12-17 DIAGNOSIS — M25552 Pain in left hip: Secondary | ICD-10-CM | POA: Diagnosis not present

## 2017-12-18 DIAGNOSIS — M25512 Pain in left shoulder: Secondary | ICD-10-CM | POA: Diagnosis not present

## 2017-12-18 DIAGNOSIS — M25612 Stiffness of left shoulder, not elsewhere classified: Secondary | ICD-10-CM | POA: Diagnosis not present

## 2017-12-18 DIAGNOSIS — M6281 Muscle weakness (generalized): Secondary | ICD-10-CM | POA: Diagnosis not present

## 2017-12-22 DIAGNOSIS — Z9889 Other specified postprocedural states: Secondary | ICD-10-CM | POA: Diagnosis not present

## 2017-12-22 DIAGNOSIS — M7542 Impingement syndrome of left shoulder: Secondary | ICD-10-CM | POA: Diagnosis not present

## 2017-12-24 ENCOUNTER — Other Ambulatory Visit: Payer: Self-pay

## 2017-12-24 ENCOUNTER — Emergency Department (HOSPITAL_COMMUNITY)
Admission: EM | Admit: 2017-12-24 | Discharge: 2017-12-24 | Disposition: A | Discharge status: Home / Self Care | Payer: 59 | Attending: Emergency Medicine | Admitting: Emergency Medicine

## 2017-12-24 ENCOUNTER — Encounter (HOSPITAL_COMMUNITY): Payer: Self-pay

## 2017-12-24 DIAGNOSIS — G8929 Other chronic pain: Secondary | ICD-10-CM

## 2017-12-24 DIAGNOSIS — F17210 Nicotine dependence, cigarettes, uncomplicated: Secondary | ICD-10-CM | POA: Insufficient documentation

## 2017-12-24 DIAGNOSIS — M5442 Lumbago with sciatica, left side: Secondary | ICD-10-CM | POA: Diagnosis not present

## 2017-12-24 DIAGNOSIS — M5441 Lumbago with sciatica, right side: Secondary | ICD-10-CM | POA: Diagnosis not present

## 2017-12-24 DIAGNOSIS — Z79899 Other long term (current) drug therapy: Secondary | ICD-10-CM | POA: Diagnosis not present

## 2017-12-24 DIAGNOSIS — M1711 Unilateral primary osteoarthritis, right knee: Secondary | ICD-10-CM | POA: Diagnosis not present

## 2017-12-24 DIAGNOSIS — I10 Essential (primary) hypertension: Secondary | ICD-10-CM | POA: Insufficient documentation

## 2017-12-24 DIAGNOSIS — M545 Low back pain: Secondary | ICD-10-CM | POA: Diagnosis present

## 2017-12-24 MED ORDER — METHYLPREDNISOLONE 4 MG PO TBPK: ORAL_TABLET | ORAL | 0 refills | Status: AC

## 2017-12-24 MED ORDER — OXYCODONE-ACETAMINOPHEN 5-325 MG PO TABS
2.0000 | ORAL_TABLET | Freq: Once | ORAL | Status: AC
  Administered 2017-12-24: 2 via ORAL
  Filled 2017-12-24: qty 2

## 2017-12-24 MED ORDER — KETOROLAC TROMETHAMINE 60 MG/2ML IM SOLN
60.0000 mg | Freq: Once | INTRAMUSCULAR | Status: AC
  Administered 2017-12-24: 60 mg via INTRAMUSCULAR
  Filled 2017-12-24: qty 2

## 2017-12-24 MED ORDER — METHOCARBAMOL 1000 MG/10ML IJ SOLN
1000.0000 mg | Freq: Once | INTRAMUSCULAR | Status: AC
  Administered 2017-12-24: 1000 mg via INTRAMUSCULAR
  Filled 2017-12-24 (×2): qty 10

## 2017-12-24 MED ORDER — OXYCODONE-ACETAMINOPHEN 5-325 MG PO TABS: 2.0000 | ORAL_TABLET | ORAL | 0 refills | Status: DC | PRN

## 2017-12-24 MED ORDER — METHOCARBAMOL 500 MG PO TABS: 500.0000 mg | ORAL_TABLET | Freq: Two times a day (BID) | ORAL | 0 refills | Status: DC

## 2017-12-24 MED ORDER — DEXAMETHASONE SODIUM PHOSPHATE 10 MG/ML IJ SOLN
20.0000 mg | Freq: Once | INTRAMUSCULAR | Status: AC
  Administered 2017-12-24: 20 mg via INTRAMUSCULAR
  Filled 2017-12-24: qty 2

## 2017-12-24 NOTE — Discharge Instructions (Signed)
Take Medrol dose-pack as prescribed. Take robaxin as needed for pain, do not take this with your home flexiril. Follow up with your PCP and/or your orthopedic provider as needed. Return to the ED if you experience severe worsening of your symptoms, loss of control of bowel or bladder, weakness.

## 2017-12-24 NOTE — ED Provider Notes (Signed)
[facility]-EMERGENCY DEPT Provider Note   CSN: [id number] Arrival date & time: 12/24/17  1033     History   Chief Complaint Chief Complaint  Patient presents with  . Back Pain    HPI Brenda Obrien is a 52 y.o. female with a pmhx of osteoarthritis, DDD, chronic back pain, s/p L5-S1 laminectomy who presented to the ED today complaining of acute on chronic lower back pain. Patient reports that she takes Flexeril 3 times a day and up to 1200 mg of gabapentin daily for her chronic back and knee pain as well as her carpal tunnel and bilateral wrists. On Monday morning she woke up and had very severe lower back pain which has progressively worsened over the past 2 days. She is able to ambulate but states that it is painful. She denies any bowel or bladder incontinence. This feels similar to her prior episodes of back pain. She is scheduled to see her orthopedic doctor on September 20 but did not feel she could make it until then.  HPI  Past Medical History:  Diagnosis Date  . Asthma   . Cataract   . Depression   . Elevated cholesterol   . Endometriosis   . Generalized anxiety disorder 2017  . GERD (gastroesophageal reflux disease)   . Glaucoma   . 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 Hypertension   . PTSD (post-traumatic stress disorder) 2017    Patient Active Problem List   Diagnosis Date Noted  . Primary osteoarthritis of right knee 10/24/2017  . Primary osteoarthritis of both hands 10/24/2017  . Primary osteoarthritis of both hips 10/24/2017  . DDD (degenerative disc disease), lumbar 11/18/2016  . Severe episode of recurrent major depressive disorder, without psychotic features (HCC) 03/15/2016  . MDD (major depressive disorder), recurrent episode, severe (HCC) 03/15/2016  . Insomnia 12/27/2015  . Adhesive capsulitis of right shoulder 12/21/2015  . Well woman exam 09/28/2014  . Obesity, unspecified 09/20/2013  . Carpal tunnel syndrome 09/20/2013  . Allergic  rhinitis 09/20/2013  . H/O total knee replacement 01/25/2013  . Depression 12/23/2012  . Anxiety 03/13/2012  . Hyperglycemia 02/03/2012  . Hypertension 01/21/2012  . Anterior cruciate ligament complete tear 07/01/2011  . Dyslipidemia 11/22/2010  . GERD 03/27/2009    Past Surgical History:  Procedure Laterality Date  . ANTERIOR CRUCIATE LIGAMENT REPAIR  1987,2001,2003,2005  . BACK SURGERY  01/2017   due herniated disc  . EYE SURGERY Bilateral 2016   release pressure   . LAPAROSCOPIC CHOLECYSTECTOMY  5/14   [place], [place]  . LAPAROSCOPIC TOTAL HYSTERECTOMY  6/11  . left knee surgery  09/2011  . NASAL SINUS SURGERY  1992  . REPLACEMENT TOTAL KNEE Left 9/14   Dr. Elliot Dally  . SHOULDER SURGERY Right 09/08/2015   due to injury   . SHOULDER SURGERY Right 03/20/2016   due to injury  . SHOULDER SURGERY Left 09/10/2017   due rotator cuff     OB History    Gravida  0   Para  0   Term  0   Preterm  0   AB  0   Living  0     SAB  0   TAB  0   Ectopic  0   Multiple  0   Live Births               Home Medications    Prior to Admission medications   Medication Sig Start Date End Date Taking? Authorizing Provider  amitriptyline (ELAVIL) 50 MG tablet Take 100 mg by mouth at bedtime as needed for sleep.  03/27/17  Yes [provider]  amoxicillin (AMOXIL) 500 MG capsule Take 1,000 mg by mouth as needed (For dental appointments.).  01/13/17  Yes [provider]  Brexpiprazole (REXULTI) 3 MG TABS Take 3 mg by mouth daily.  02/24/17  Yes [provider]  cyclobenzaprine (FLEXERIL) 10 MG tablet Take 10 mg by mouth 3 (three) times daily.  09/15/17  Yes [provider]  diclofenac sodium (VOLTAREN) 1 % GEL Apply 3 gm to 3 large joints up to 3 times a day.Dispense 3 tubes with 3 refills. Patient taking differently: Apply 3 g topically 3 (three) times daily as needed (For joints.).  11/05/17  Yes Deveshwar, Janalyn Rouse, MD  DULoxetine (CYMBALTA)  60 MG capsule Take 120 mg by mouth daily.    Yes [provider]  gabapentin (NEURONTIN) 600 MG tablet Take 1 tablet (600 mg total) by mouth 2 (two) times daily. 09/19/17  Yes Jerene Bears, MD  hydrOXYzine (VISTARIL) 25 MG capsule Take 25 mg by mouth every 6 (six) hours as needed for anxiety.    Yes [provider]  lisinopril (PRINIVIL,ZESTRIL) 20 MG tablet TAKE 1 TABLET BY MOUTH EVERY DAY Patient taking differently: Take 20 mg by mouth daily.  10/06/17  Yes Dianne Dun, MD  omeprazole (PRILOSEC) 40 MG capsule TAKE 1 CAPSULE BY MOUTH  DAILY Patient taking differently: Take 40 mg by mouth daily.  06/04/17  Yes Dianne Dun, MD  prazosin (MINIPRESS) 2 MG capsule Take 2 mg by mouth 2 (two) times daily.   Yes [provider]  simvastatin (ZOCOR) 20 MG tablet Take 1 tablet (20 mg total) by mouth at bedtime. Patient taking differently: Take 20 mg by mouth daily.  04/10/17  Yes Dianne Dun, MD    Family History Family History  Problem Relation Age of Onset  . Coronary artery disease Mother        PAD  . Heart attack Mother 43  . Diabetes Mother   . Lung cancer Father   . Heart attack Father 30  . Hypertension Brother   . High Cholesterol Brother     Social History Social History   Tobacco Use  . Smoking status: Current Every Day Smoker    Packs/day: 1.00    Types: Cigarettes  . Smokeless tobacco: Never Used  Substance Use Topics  . Alcohol use: Yes    Alcohol/week: 12.0 standard drinks    Types: 12 Cans of beer per week    Comment: Socially  . Drug use: Never     Allergies   Patient has no known allergies.   Review of Systems Review of Systems  All other systems reviewed and are negative.    Physical Exam Updated Vital Signs BP 139/83 (BP Location: Left Arm)   Pulse (!) 104   Temp 97.8 F (36.6 C) (Oral)   Resp 18   Ht 5\' 6"  (1.676 m)   Wt 115.7 kg   SpO2 94%   BMI 41.16 kg/m   Physical Exam  Constitutional: She is oriented to  person, place, and time. She appears well-developed and well-nourished. No distress.  HENT:  Head: Normocephalic and atraumatic.  Eyes: Conjunctivae are normal. Right eye exhibits no discharge. Left eye exhibits no discharge. No scleral icterus.  Cardiovascular: Normal rate.  Pulmonary/Chest: Effort normal.  Musculoskeletal:  No midline spinal TTP. TTP over R and L lumbar paraspinal  muscles. ROM of lumbar spine limited by pain. No step offs or obvious bony deformities. Negative SLR. NOrmal strength throughout   Neurological: She is alert and oriented to person, place, and time. Coordination normal.  Skin: Skin is warm and dry. No rash noted. She is not diaphoretic. No erythema. No pallor.  Psychiatric: She has a normal mood and affect. Her behavior is normal.  Nursing note and vitals reviewed.    ED Treatments / Results  Labs (all labs ordered are listed, but only abnormal results are displayed) Labs Reviewed - No data to display  EKG None  Radiology No results found.  Procedures Procedures (including critical care time)  Medications Ordered in ED Medications  ketorolac (TORADOL) injection 60 mg (60 mg Intramuscular Given 12/24/17 1328)  dexamethasone (DECADRON) injection 20 mg (20 mg Intramuscular Given 12/24/17 1328)  oxyCODONE-acetaminophen (PERCOCET/ROXICET) 5-325 MG per tablet 2 tablet (2 tablets Oral Given 12/24/17 1520)  methocarbamol (ROBAXIN) injection 1,000 mg (1,000 mg Intramuscular Given 12/24/17 1608)     Initial Impression / Assessment and Plan / ED Course  I have reviewed the triage vital signs and the nursing notes.  Pertinent labs & imaging results that were available during my care of the patient were reviewed by me and considered in my medical decision making (see chart for details).    52 y.o F with acute on chronic back pain. SHe has hx of DDD< s/p L5-S1 laminectomy. NO recent trauma/injury. No neurological deficits or red flag symptoms.  No loss of bowel  or bladder control.  No concern for cauda equina.  No fever, night sweats, weight loss, h/o cancer, IVDU.  SHe was given IM toradl and IM decadron with minimal relief in symptoms on re-evaluation so I ordered PO percocet. She did report feelin better with this however when I tried to sit her up she began having severe lower back pain. SHe was given IM robaxin. Will re-assess.   4:35 PM Pt re-assessed after IM robaxin and feeling much better. Now ambulating without difficulty. WIll d/c on steroid taper (she was given this by ortho provider in April 2019 when she had AoC back pain), robaxin. SHe will follow up with ortho provider in 10 days.    Final Clinical Impressions(s) / ED Diagnoses   Final diagnoses:  None    ED Discharge Orders    None       Dub Mikes, PA-C 12/24/17 1636    Pricilla Loveless, MD 12/25/17 [phone]

## 2017-12-24 NOTE — ED Triage Notes (Signed)
Pt states she has been having back pain since Monday. Pt states pain is not relieved by sitting, standing, etc. Pt has hx of back spasms and back surgery 01/2017.

## 2017-12-30 ENCOUNTER — Ambulatory Visit: Payer: Self-pay | Admitting: Family Medicine

## 2017-12-30 DIAGNOSIS — M25612 Stiffness of left shoulder, not elsewhere classified: Secondary | ICD-10-CM | POA: Diagnosis not present

## 2017-12-30 DIAGNOSIS — M25512 Pain in left shoulder: Secondary | ICD-10-CM | POA: Diagnosis not present

## 2017-12-30 DIAGNOSIS — M6281 Muscle weakness (generalized): Secondary | ICD-10-CM | POA: Diagnosis not present

## 2018-01-01 ENCOUNTER — Ambulatory Visit: Payer: Self-pay | Admitting: Family Medicine

## 2018-01-02 DIAGNOSIS — M4316 Spondylolisthesis, lumbar region: Secondary | ICD-10-CM | POA: Diagnosis not present

## 2018-01-02 DIAGNOSIS — M4726 Other spondylosis with radiculopathy, lumbar region: Secondary | ICD-10-CM | POA: Diagnosis not present

## 2018-01-02 DIAGNOSIS — M5136 Other intervertebral disc degeneration, lumbar region: Secondary | ICD-10-CM | POA: Diagnosis not present

## 2018-01-12 DIAGNOSIS — M25552 Pain in left hip: Secondary | ICD-10-CM | POA: Diagnosis not present

## 2018-01-12 DIAGNOSIS — M1612 Unilateral primary osteoarthritis, left hip: Secondary | ICD-10-CM | POA: Diagnosis not present

## 2018-01-14 DIAGNOSIS — M25652 Stiffness of left hip, not elsewhere classified: Secondary | ICD-10-CM | POA: Diagnosis not present

## 2018-01-14 DIAGNOSIS — M25552 Pain in left hip: Secondary | ICD-10-CM | POA: Diagnosis not present

## 2018-01-14 DIAGNOSIS — M6281 Muscle weakness (generalized): Secondary | ICD-10-CM | POA: Diagnosis not present

## 2018-01-16 DIAGNOSIS — M25552 Pain in left hip: Secondary | ICD-10-CM | POA: Diagnosis not present

## 2018-01-20 DIAGNOSIS — M6281 Muscle weakness (generalized): Secondary | ICD-10-CM | POA: Diagnosis not present

## 2018-01-20 DIAGNOSIS — M25652 Stiffness of left hip, not elsewhere classified: Secondary | ICD-10-CM | POA: Diagnosis not present

## 2018-01-20 DIAGNOSIS — M25552 Pain in left hip: Secondary | ICD-10-CM | POA: Diagnosis not present

## 2018-01-22 DIAGNOSIS — M25552 Pain in left hip: Secondary | ICD-10-CM | POA: Diagnosis not present

## 2018-01-22 DIAGNOSIS — M25652 Stiffness of left hip, not elsewhere classified: Secondary | ICD-10-CM | POA: Diagnosis not present

## 2018-01-22 DIAGNOSIS — M6281 Muscle weakness (generalized): Secondary | ICD-10-CM | POA: Diagnosis not present

## 2018-01-26 DIAGNOSIS — M25652 Stiffness of left hip, not elsewhere classified: Secondary | ICD-10-CM | POA: Diagnosis not present

## 2018-01-26 DIAGNOSIS — M6281 Muscle weakness (generalized): Secondary | ICD-10-CM | POA: Diagnosis not present

## 2018-01-26 DIAGNOSIS — M25552 Pain in left hip: Secondary | ICD-10-CM | POA: Diagnosis not present

## 2018-01-28 DIAGNOSIS — M25552 Pain in left hip: Secondary | ICD-10-CM | POA: Diagnosis not present

## 2018-01-28 DIAGNOSIS — M25652 Stiffness of left hip, not elsewhere classified: Secondary | ICD-10-CM | POA: Diagnosis not present

## 2018-01-28 DIAGNOSIS — M6281 Muscle weakness (generalized): Secondary | ICD-10-CM | POA: Diagnosis not present

## 2018-02-04 ENCOUNTER — Encounter: Payer: Self-pay | Admitting: Family Medicine

## 2018-02-04 ENCOUNTER — Ambulatory Visit (INDEPENDENT_AMBULATORY_CARE_PROVIDER_SITE_OTHER): Payer: 59 | Admitting: Family Medicine

## 2018-02-04 VITALS — BP 94/60 | HR 117 | Temp 98.8°F | Ht 66.0 in | Wt 250.4 lb

## 2018-02-04 DIAGNOSIS — M25552 Pain in left hip: Secondary | ICD-10-CM | POA: Diagnosis not present

## 2018-02-04 DIAGNOSIS — E785 Hyperlipidemia, unspecified: Secondary | ICD-10-CM

## 2018-02-04 DIAGNOSIS — Z23 Encounter for immunization: Secondary | ICD-10-CM

## 2018-02-04 DIAGNOSIS — I951 Orthostatic hypotension: Secondary | ICD-10-CM | POA: Diagnosis not present

## 2018-02-04 DIAGNOSIS — M6281 Muscle weakness (generalized): Secondary | ICD-10-CM | POA: Diagnosis not present

## 2018-02-04 DIAGNOSIS — I959 Hypotension, unspecified: Secondary | ICD-10-CM | POA: Insufficient documentation

## 2018-02-04 DIAGNOSIS — K219 Gastro-esophageal reflux disease without esophagitis: Secondary | ICD-10-CM

## 2018-02-04 DIAGNOSIS — M25652 Stiffness of left hip, not elsewhere classified: Secondary | ICD-10-CM | POA: Diagnosis not present

## 2018-02-04 MED ORDER — LISINOPRIL 5 MG PO TABS: 5.0000 mg | ORAL_TABLET | Freq: Every day | ORAL | 3 refills | Status: DC

## 2018-02-04 MED ORDER — OMEPRAZOLE 40 MG PO CPDR: 40.0000 mg | DELAYED_RELEASE_CAPSULE | Freq: Every day | ORAL | 3 refills | Status: DC

## 2018-02-04 MED ORDER — SIMVASTATIN 20 MG PO TABS: 20.0000 mg | ORAL_TABLET | Freq: Every day | ORAL | 3 refills | Status: DC

## 2018-02-04 NOTE — Progress Notes (Signed)
Subjective:   Patient ID: Brenda Obrien, female    DOB: May 11, 1965, 52 y.o.   MRN: [id number]  Brenda Obrien is a pleasant 52 y.o. year old female who presents to clinic today with Hypotension  on 02/04/2018  HPI: Orthostatic hypotension- Taking lisinopril 20 mg daily and on minipress 2 mg twice daily for PTSD (prescribed by psychiatry) and has been taking both for years.  For approximately a month, her BP has been getting lower and lower.  Several days per week, she feels dizzy.  Almost passed out at PT this morning.  She is not sure what her BP was at PT this morning and not sure what it has been when she is dizzy at home (does not have a BP cuff at home).  The only change in the past month in terms of her medication is that Robaxin was added for her severe back pain and she has been taking hydroxizine regularly for anxiety.  She feels the low blood pressure and dizziness started almost immediately after starting Robaxin.  Severe OA- Was seen in ER for back pain 12/24/17- note reviewed. Placed on robaxin at that time which has helped tremendously.  Taking Robaxin 500 mg once to twice daily.  Sees ortho- Dr. Christain Sacramento for her back, Dr. Joselyn Arrow for hips and knees.  Health Maintenance  Topic Date Due  . PAP SMEAR  09/14/2019  . MAMMOGRAM  10/25/2019  . Fecal DNA (Cologuard)  10/21/2020  . TETANUS/TDAP  02/05/2028  . INFLUENZA VACCINE  Completed  . HIV Screening  Discontinued   Current Outpatient Medications on File Prior to Visit  Medication Sig Dispense Refill  . amitriptyline (ELAVIL) 100 MG tablet Take 100 mg by mouth at bedtime.  4  . amoxicillin (AMOXIL) 500 MG capsule Take 1,000 mg by mouth as needed (For dental appointments.).     Marland Kitchen Brexpiprazole (REXULTI) 3 MG TABS Take 3 mg by mouth daily.     . diclofenac sodium (VOLTAREN) 1 % GEL Apply 3 gm to 3 large joints up to 3 times a day.Dispense 3 tubes with 3 refills. (Patient taking differently: Apply 3 g topically 3 (three)  times daily as needed (For joints.). ) 5 Tube 0  . DULoxetine (CYMBALTA) 60 MG capsule Take 120 mg by mouth daily.     Marland Kitchen gabapentin (NEURONTIN) 600 MG tablet Take 1 tablet (600 mg total) by mouth 2 (two) times daily.    . hydrOXYzine (VISTARIL) 25 MG capsule Take 25 mg by mouth every 6 (six) hours as needed for anxiety.     . methocarbamol (ROBAXIN) 500 MG tablet Take 1 tablet (500 mg total) by mouth 2 (two) times daily. 30 tablet 0  . oxyCODONE-acetaminophen (PERCOCET/ROXICET) 5-325 MG tablet Take 2 tablets by mouth every 4 (four) hours as needed for severe pain. 4 tablet 0  . prazosin (MINIPRESS) 2 MG capsule Take 2 mg by mouth 2 (two) times daily.     No current facility-administered medications on file prior to visit.     No Known Allergies  Past Medical History:  Diagnosis Date  . Asthma   . Cataract   . Depression   . Elevated cholesterol   . Endometriosis   . Generalized anxiety disorder 2017  . GERD (gastroesophageal reflux disease)   . Glaucoma   . 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 Hypertension   . PTSD (post-traumatic stress disorder) 2017    Past Surgical History:  Procedure Laterality Date  . ANTERIOR CRUCIATE LIGAMENT  REPAIR  1987,2001,2003,2005  . BACK SURGERY  01/2017   due herniated disc  . EYE SURGERY Bilateral 2016   release pressure   . LAPAROSCOPIC CHOLECYSTECTOMY  5/14   [place], [place]  . LAPAROSCOPIC TOTAL HYSTERECTOMY  6/11  . left knee surgery  09/2011  . NASAL SINUS SURGERY  1992  . REPLACEMENT TOTAL KNEE Left 9/14   Dr. Elliot Dally  . SHOULDER SURGERY Right 09/08/2015   due to injury   . SHOULDER SURGERY Right 03/20/2016   due to injury  . SHOULDER SURGERY Left 09/10/2017   due rotator cuff    Family History  Problem Relation Age of Onset  . Coronary artery disease Mother        PAD  . Heart attack Mother 67  . Diabetes Mother   . Lung cancer Father   . Heart attack Father 30  . Hypertension Brother   . High Cholesterol Brother     Social  History   Socioeconomic History  . Marital status: Single    Spouse name: Not on file  . Number of children: Not on file  . Years of education: Not on file  . Highest education level: Not on file  Occupational History  . 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 Financial resource strain: Not on file  . Food insecurity:    Worry: Not on file    Inability: Not on file  . Transportation needs:    Medical: Not on file    Non-medical: Not on file  Tobacco Use  . Smoking status: Current Every Day Smoker    Packs/day: 1.00    Types: Cigarettes  . Smokeless tobacco: Never Used  Substance and Sexual Activity  . Alcohol use: Yes    Alcohol/week: 12.0 standard drinks    Types: 12 Cans of beer per week    Comment: Socially  . Drug use: Never  . Sexual activity: Not Currently    Birth control/protection: Surgical    Comment: hysterectomy  Lifestyle  . Physical activity:    Days per week: Not on file    Minutes per session: Not on file  . Stress: Not on file  Relationships  . Social connections:    Talks on phone: Not on file    Gets together: Not on file    Attends religious service: Not on file    Active member of club or organization: Not on file    Attends meetings of clubs or organizations: Not on file    Relationship status: Not on file  . Intimate partner violence:    Fear of current or ex partner: Not on file    Emotionally abused: Not on file    Physically abused: Not on file    Forced sexual activity: Not on file  Other Topics Concern  . Not on file  Social History Narrative   Single      No regular exercise      Works as Conservator, museum/gallery for [employer]         The PMH, PSH, Social History, Family History, Medications, and allergies have been reviewed in [facility], and have been updated if relevant.   Review of Systems  Constitutional: Positive for fatigue.  Musculoskeletal: Positive for  arthralgias and myalgias.  Neurological: Positive for dizziness, syncope and light-headedness. Negative for tremors, seizures, facial asymmetry, speech difficulty, weakness, numbness and headaches.  Psychiatric/Behavioral: Negative for self-injury,  sleep disturbance and suicidal ideas. The patient is nervous/anxious.   All other systems reviewed and are negative.      Objective:    BP 94/60 (BP Location: Left Arm, Cuff Size: Normal)   Pulse (!) 117   Temp 98.8 F (37.1 C) (Oral)   Ht 5\' 6"  (1.676 m)   Wt 250 lb 6.4 oz (113.6 kg)   SpO2 95%   BMI 40.42 kg/m    Physical Exam  Constitutional: She is oriented to person, place, and time. She appears well-developed. No distress.  She is dizzy when asked to stand, ortho static BP and symptomatically  HENT:  Head: Normocephalic and atraumatic.  Eyes: Pupils are equal, round, and reactive to light. EOM are normal.  Neck: Normal range of motion.  Cardiovascular: Tachycardia present.  Pulmonary/Chest: Effort normal.  Musculoskeletal: Normal range of motion.  Neurological: She is alert and oriented to person, place, and time. No cranial nerve deficit. Coordination normal.  Skin: Skin is warm and dry. She is not diaphoretic.  Psychiatric: She has a normal mood and affect. Her behavior is normal. Judgment and thought content normal.  Nursing note and vitals reviewed.         Assessment & Plan:   Need for influenza vaccination - Plan: Flu Vaccine QUAD 6+ mos PF IM (Fluarix Quad PF)  Need for Tdap vaccination - Plan: Tdap vaccine greater than or equal to 7yo IM  Orthostatic hypotension  Gastroesophageal reflux disease, esophagitis presence not specified - Plan: omeprazole (PRILOSEC) 40 MG capsule  Dyslipidemia - Plan: Comprehensive metabolic panel, Lipid panel, CBC with Differential/Platelet, TSH Return in about 2 years (around 02/05/2020) for blood pressure recheck., follow up anxiety and depression.Marland Kitchen

## 2018-02-04 NOTE — Assessment & Plan Note (Signed)
>  25 minutes spent in face to face time with patient, >50% spent in counselling or coordination of care.  Brenda Obrien feels this started with the addition of robaxin.  She also feels Robaxin has improved her quality of life significantly.  She is not sure how long she will need it but likely this will be a longer term medication.  We discussed her current BP medications- she was not aware that minipress is actually also an antihypertensive.  She is leery to stop lisinopril completely as her BP can spike.  So we agreed to continue her minipress (for PTSD which is managed by psychiatry), and decrease her Lisinopril to 5 mg daily.  If she is at all dizzy, she will not take lisinopril that day and contact me immediately.  She is instructed to follow up with me in 2 weeks.  At that time, we will check labs as well. The patient indicates understanding of these issues and agrees with the plan.

## 2018-02-04 NOTE — Patient Instructions (Addendum)
Please STOP taking lisinopril 20 mg daily. Start taking Lisinopril 5 mg nightly starting tomorrow night.  Follow up with me in 2 weeks.  Please sign request for [facility] for your Cologuard results :)

## 2018-02-06 DIAGNOSIS — M25652 Stiffness of left hip, not elsewhere classified: Secondary | ICD-10-CM | POA: Diagnosis not present

## 2018-02-06 DIAGNOSIS — M25552 Pain in left hip: Secondary | ICD-10-CM | POA: Diagnosis not present

## 2018-02-06 DIAGNOSIS — M6281 Muscle weakness (generalized): Secondary | ICD-10-CM | POA: Diagnosis not present

## 2018-02-10 DIAGNOSIS — M25552 Pain in left hip: Secondary | ICD-10-CM | POA: Diagnosis not present

## 2018-02-10 DIAGNOSIS — M25652 Stiffness of left hip, not elsewhere classified: Secondary | ICD-10-CM | POA: Diagnosis not present

## 2018-02-10 DIAGNOSIS — M6281 Muscle weakness (generalized): Secondary | ICD-10-CM | POA: Diagnosis not present

## 2018-02-12 DIAGNOSIS — M25652 Stiffness of left hip, not elsewhere classified: Secondary | ICD-10-CM | POA: Diagnosis not present

## 2018-02-12 DIAGNOSIS — M25552 Pain in left hip: Secondary | ICD-10-CM | POA: Diagnosis not present

## 2018-02-12 DIAGNOSIS — M6281 Muscle weakness (generalized): Secondary | ICD-10-CM | POA: Diagnosis not present

## 2018-02-13 DIAGNOSIS — E119 Type 2 diabetes mellitus without complications: Secondary | ICD-10-CM

## 2018-02-13 HISTORY — DX: Type 2 diabetes mellitus without complications: E11.9

## 2018-02-16 DIAGNOSIS — M25552 Pain in left hip: Secondary | ICD-10-CM | POA: Diagnosis not present

## 2018-02-16 DIAGNOSIS — M6281 Muscle weakness (generalized): Secondary | ICD-10-CM | POA: Diagnosis not present

## 2018-02-16 DIAGNOSIS — M25652 Stiffness of left hip, not elsewhere classified: Secondary | ICD-10-CM | POA: Diagnosis not present

## 2018-02-18 ENCOUNTER — Other Ambulatory Visit: Payer: Self-pay

## 2018-02-18 ENCOUNTER — Ambulatory Visit (INDEPENDENT_AMBULATORY_CARE_PROVIDER_SITE_OTHER): Payer: 59 | Admitting: Family Medicine

## 2018-02-18 VITALS — BP 122/78 | HR 113 | Temp 98.9°F | Ht 66.0 in | Wt 250.8 lb

## 2018-02-18 DIAGNOSIS — I10 Essential (primary) hypertension: Secondary | ICD-10-CM

## 2018-02-18 DIAGNOSIS — F339 Major depressive disorder, recurrent, unspecified: Secondary | ICD-10-CM

## 2018-02-18 DIAGNOSIS — I951 Orthostatic hypotension: Secondary | ICD-10-CM | POA: Diagnosis not present

## 2018-02-18 DIAGNOSIS — E785 Hyperlipidemia, unspecified: Secondary | ICD-10-CM | POA: Diagnosis not present

## 2018-02-18 LAB — COMPREHENSIVE METABOLIC PANEL
ALK PHOS: 118 U/L — AB (ref 39–117)
ALT: 95 U/L — AB (ref 0–35)
AST: 38 U/L — ABNORMAL HIGH (ref 0–37)
Albumin: 4.3 g/dL (ref 3.5–5.2)
BILIRUBIN TOTAL: 0.5 mg/dL (ref 0.2–1.2)
BUN: 11 mg/dL (ref 6–23)
CO2: 27 meq/L (ref 19–32)
Calcium: 9.7 mg/dL (ref 8.4–10.5)
Chloride: 95 mEq/L — ABNORMAL LOW (ref 96–112)
Creatinine, Ser: 0.75 mg/dL (ref 0.40–1.20)
GFR: 86.62 mL/min (ref >60.00)
GLUCOSE: 375 mg/dL — AB (ref 70–99)
POTASSIUM: 4.5 meq/L (ref 3.5–5.1)
Sodium: 131 mEq/L — ABNORMAL LOW (ref 135–145)
Total Protein: 6.9 g/dL (ref 6.0–8.3)

## 2018-02-18 LAB — LIPID PANEL
Cholesterol: 212 mg/dL — ABNORMAL HIGH (ref 0–200)
HDL: 44.9 mg/dL (ref >39.00)
NONHDL: 166.87
Total CHOL/HDL Ratio: 5
Triglycerides: 253 mg/dL — ABNORMAL HIGH (ref 0.0–149.0)
VLDL: 50.6 mg/dL — ABNORMAL HIGH (ref 0.0–40.0)

## 2018-02-18 LAB — CBC WITH DIFFERENTIAL/PLATELET
BASOS ABS: 0.1 10*3/uL (ref 0.0–0.1)
Basophils Relative: 0.7 % (ref 0.0–3.0)
Eosinophils Absolute: 0.1 10*3/uL (ref 0.0–0.7)
Eosinophils Relative: 1 % (ref 0.0–5.0)
HCT: 45.3 % (ref 36.0–46.0)
Hemoglobin: 15.4 g/dL — ABNORMAL HIGH (ref 12.0–15.0)
LYMPHS ABS: 2 10*3/uL (ref 0.7–4.0)
Lymphocytes Relative: 25.6 % (ref 12.0–46.0)
MCHC: 34.1 g/dL (ref 30.0–36.0)
MCV: 92.1 fl (ref 78.0–100.0)
MONO ABS: 0.6 10*3/uL (ref 0.1–1.0)
MONOS PCT: 7.6 % (ref 3.0–12.0)
NEUTROS PCT: 65.1 % (ref 43.0–77.0)
Neutro Abs: 5.2 10*3/uL (ref 1.4–7.7)
PLATELETS: 156 10*3/uL (ref 150.0–400.0)
RBC: 4.92 Mil/uL (ref 3.87–5.11)
RDW: 14.3 % (ref 11.5–15.5)
WBC: 7.9 10*3/uL (ref 4.0–10.5)

## 2018-02-18 LAB — LDL CHOLESTEROL, DIRECT: Direct LDL: 161 mg/dL

## 2018-02-18 LAB — TSH: TSH: 1.22 u[IU]/mL (ref 0.35–4.50)

## 2018-02-18 NOTE — Assessment & Plan Note (Addendum)
Improved but she remains orthostatic. D/c lisinopril.  Continue minipress as it is working well for her treatment of PTSD.  >25 minutes spent in face to face time with patient, >50% spent in counselling or coordination of care

## 2018-02-18 NOTE — Assessment & Plan Note (Signed)
D/c lisinopril due to hypotension. She will check BPs at home over the next two weeks and update me. Labs today. The patient indicates understanding of these issues and agrees with the plan.

## 2018-02-18 NOTE — Addendum Note (Signed)
Addended by: Arva Chafe on: 02/18/2018 12:36 PM   Modules accepted: Orders

## 2018-02-18 NOTE — Patient Instructions (Addendum)
Great to see you. I will call you with your lab results from today and you can view them online.   We are stopping Lisinopril.  Continue minipress.  Check it at home, please update me over the next two weeks, sooner if you have any more dizzy spells.

## 2018-02-18 NOTE — Progress Notes (Signed)
Subjective:   Patient ID: Brenda Obrien, female    DOB: 03-Jun-1965, 52 y.o.   MRN: [id number]  Brenda Obrien is a pleasant 51 y.o. year old female who presents to clinic today with Follow-up (HTN is doing better she states, only a couple episodes where it dropped.)  on 02/18/2018  HPI:  Last saw her two weeks ago- Note reviewed from 02/04/18. At that time, she was displaying signs and symptoms of orthostatic hypotension. BP was 94/60, pulse 117 and her orthostatic BPs were positive as well.  She endorsed feeling dizzy for about a month.  Had a near syncopal episode during PT prior to seeing me.  She had been taking Lisinopril 20 mg daily and minipress 2 mg twice daily for PTSD (prescribed by psychiatry).  The only change in the past month in terms of her medication is that Robaxin was added for her severe back pain and she has been taking hydroxizine regularly for anxiety.  She feels the low blood pressure and dizziness started almost immediately after starting Robaxin.  Severe OA- Was seen in ER for back pain 12/24/17- note reviewed. Placed on robaxin at that time which has helped tremendously.  Taking Robaxin 500 mg once to twice daily.  Sees ortho- Dr. Christain Sacramento for her back, Dr. Joselyn Arrow for hips and knees.  We decreased her lisinopril to 5 mg daily and continued her minipress which is managed by psychiatry. She is here to follow up with this today. She feels better today- only had a few  times in the last two weeks (last episode was 10/31) of low blood pressures (HR was 131) and symptoms associated with that.  Had some mild dizziness this morning.  Depression- Under financial stress and applying for disability. Current medications she feels are working.  Depression screen PHQ 2/9 02/18/2018  Decreased Interest 1  Down, Depressed, Hopeless 1  PHQ - 2 Score 2  Altered sleeping 1  Tired, decreased energy 1  Change in appetite 0  Feeling bad or failure about yourself  0    Trouble concentrating 1  Moving slowly or fidgety/restless 0  Suicidal thoughts 0  PHQ-9 Score 5   GAD 7 : Generalized Anxiety Score 02/18/2018  Nervous, Anxious, on Edge 2  Control/stop worrying 2  Worry too much - different things 1  Trouble relaxing 1  Restless 0  Easily annoyed or irritable 0  Afraid - awful might happen 0  Total GAD 7 Score 6       Current Outpatient Medications on File Prior to Visit  Medication Sig Dispense Refill  . amitriptyline (ELAVIL) 100 MG tablet Take 100 mg by mouth at bedtime.  4  . amoxicillin (AMOXIL) 500 MG capsule Take 1,000 mg by mouth as needed (For dental appointments.).     Marland Kitchen Brexpiprazole (REXULTI) 3 MG TABS Take 3 mg by mouth daily.     . diclofenac sodium (VOLTAREN) 1 % GEL Apply 3 gm to 3 large joints up to 3 times a day.Dispense 3 tubes with 3 refills. (Patient taking differently: Apply 3 g topically 3 (three) times daily as needed (For joints.). ) 5 Tube 0  . DULoxetine (CYMBALTA) 60 MG capsule Take 120 mg by mouth daily.     Marland Kitchen gabapentin (NEURONTIN) 600 MG tablet Take 1 tablet (600 mg total) by mouth 2 (two) times daily.    . hydrOXYzine (VISTARIL) 25 MG capsule Take 25 mg by mouth every 6 (six) hours as needed for anxiety.     Marland Kitchen  lisinopril (PRINIVIL,ZESTRIL) 5 MG tablet Take 1 tablet (5 mg total) by mouth daily. 90 tablet 3  . methocarbamol (ROBAXIN) 500 MG tablet Take 1 tablet (500 mg total) by mouth 2 (two) times daily. 30 tablet 0  . omeprazole (PRILOSEC) 40 MG capsule Take 1 capsule (40 mg total) by mouth daily. 90 capsule 3  . oxyCODONE-acetaminophen (PERCOCET/ROXICET) 5-325 MG tablet Take 2 tablets by mouth every 4 (four) hours as needed for severe pain. 4 tablet 0  . prazosin (MINIPRESS) 2 MG capsule Take 2 mg by mouth 2 (two) times daily.    . simvastatin (ZOCOR) 20 MG tablet Take 1 tablet (20 mg total) by mouth daily. 90 tablet 3   No current facility-administered medications on file prior to visit.     No Known  Allergies  Past Medical History:  Diagnosis Date  . Asthma   . Cataract   . Depression   . Elevated cholesterol   . Endometriosis   . Generalized anxiety disorder 2017  . GERD (gastroesophageal reflux disease)   . Glaucoma   . 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 Hypertension   . PTSD (post-traumatic stress disorder) 2017    Past Surgical History:  Procedure Laterality Date  . ANTERIOR CRUCIATE LIGAMENT REPAIR  1987,2001,2003,2005  . BACK SURGERY  01/2017   due herniated disc  . EYE SURGERY Bilateral 2016   release pressure   . LAPAROSCOPIC CHOLECYSTECTOMY  5/14   [place], [place]  . LAPAROSCOPIC TOTAL HYSTERECTOMY  6/11  . left knee surgery  09/2011  . NASAL SINUS SURGERY  1992  . REPLACEMENT TOTAL KNEE Left 9/14   Dr. Elliot Dally  . SHOULDER SURGERY Right 09/08/2015   due to injury   . SHOULDER SURGERY Right 03/20/2016   due to injury  . SHOULDER SURGERY Left 09/10/2017   due rotator cuff    Family History  Problem Relation Age of Onset  . Coronary artery disease Mother        PAD  . Heart attack Mother 3  . Diabetes Mother   . Lung cancer Father   . Heart attack Father 30  . Hypertension Brother   . High Cholesterol Brother     Social History   Socioeconomic History  . Marital status: Single    Spouse name: Not on file  . Number of children: Not on file  . Years of education: Not on file  . Highest education level: Not on file  Occupational History  . 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 Financial resource strain: Not on file  . Food insecurity:    Worry: Not on file    Inability: Not on file  . Transportation needs:    Medical: Not on file    Non-medical: Not on file  Tobacco Use  . Smoking status: Current Every Day Smoker    Packs/day: 1.00    Types: Cigarettes  . Smokeless tobacco: Never Used  Substance and Sexual Activity  . Alcohol use: Yes    Alcohol/week: 12.0 standard drinks    Types: 12  Cans of beer per week    Comment: Socially  . Drug use: Never  . Sexual activity: Not Currently    Birth control/protection: Surgical    Comment: hysterectomy  Lifestyle  . Physical activity:    Days per week: Not on file    Minutes per session: Not on file  . Stress: Not on file  Relationships  . Social connections:    Talks on phone: Not on file    Gets together: Not on file    Attends religious service: Not on file    Active member of club or organization: Not on file    Attends meetings of clubs or organizations: Not on file    Relationship status: Not on file  . Intimate partner violence:    Fear of current or ex partner: Not on file    Emotionally abused: Not on file    Physically abused: Not on file    Forced sexual activity: Not on file  Other Topics Concern  . Not on file  Social History Narrative   Single      No regular exercise      Works as Conservator, museum/gallery for [employer]         The PMH, PSH, Social History, Family History, Medications, and allergies have been reviewed in [facility], and have been updated if relevant.  Review of Systems  Respiratory: Negative.   Cardiovascular: Positive for palpitations.  Endocrine: Negative.   Genitourinary: Negative.   Musculoskeletal: Negative.   Neurological: Positive for dizziness. Negative for facial asymmetry, light-headedness, numbness and headaches.  Psychiatric/Behavioral: Negative.   All other systems reviewed and are negative.      Objective:    BP 122/78 (BP Location: Left Arm, Patient Position: Sitting, Cuff Size: Normal)   Pulse (!) 113   Temp 98.9 F (37.2 C) (Oral)   Ht 5\' 6"  (1.676 m)   Wt 250 lb 12.8 oz (113.8 kg)   SpO2 96%   BMI 40.48 kg/m    Physical Exam  Constitutional: She is oriented to person, place, and time. She appears well-developed and well-nourished. No distress.  HENT:  Head: Normocephalic and atraumatic.  Eyes: EOM are normal.  Cardiovascular: Regular rhythm.   Pulmonary/Chest: Effort normal.  Musculoskeletal: She exhibits no edema.  Neurological: She is alert and oriented to person, place, and time. No cranial nerve deficit.  Skin: Skin is warm and dry. She is not diaphoretic.  Psychiatric: She has a normal mood and affect. Her behavior is normal. Judgment and thought content normal.  Nursing note and vitals reviewed.         Assessment & Plan:   Orthostatic hypotension  Essential hypertension  Dyslipidemia  Episode of recurrent major depressive disorder, unspecified depression episode severity (HCC) No follow-ups on file.

## 2018-02-18 NOTE — Addendum Note (Signed)
Addended by: Dianne Dun on: 02/18/2018 12:40 PM   Modules accepted: Orders

## 2018-02-18 NOTE — Assessment & Plan Note (Signed)
Under quite a bit of stress, PHQ9 and GAD7 look okay and she feels her medications are working.  Denies SI or HI.  "I don't feel depressed." Depression screen PHQ 2/9 02/18/2018  Decreased Interest 1  Down, Depressed, Hopeless 1  PHQ - 2 Score 2  Altered sleeping 1  Tired, decreased energy 1  Change in appetite 0  Feeling bad or failure about yourself  0  Trouble concentrating 1  Moving slowly or fidgety/restless 0  Suicidal thoughts 0  PHQ-9 Score 5   GAD 7 : Generalized Anxiety Score 02/18/2018  Nervous, Anxious, on Edge 2  Control/stop worrying 2  Worry too much - different things 1  Trouble relaxing 1  Restless 0  Easily annoyed or irritable 0  Afraid - awful might happen 0  Total GAD 7 Score 6

## 2018-02-19 ENCOUNTER — Other Ambulatory Visit: Payer: Self-pay | Admitting: Family Medicine

## 2018-02-19 ENCOUNTER — Other Ambulatory Visit (INDEPENDENT_AMBULATORY_CARE_PROVIDER_SITE_OTHER): Payer: 59

## 2018-02-19 DIAGNOSIS — M6281 Muscle weakness (generalized): Secondary | ICD-10-CM | POA: Diagnosis not present

## 2018-02-19 DIAGNOSIS — R7309 Other abnormal glucose: Secondary | ICD-10-CM

## 2018-02-19 DIAGNOSIS — M25552 Pain in left hip: Secondary | ICD-10-CM | POA: Diagnosis not present

## 2018-02-19 DIAGNOSIS — M25652 Stiffness of left hip, not elsewhere classified: Secondary | ICD-10-CM | POA: Diagnosis not present

## 2018-02-19 LAB — HEMOGLOBIN A1C: HEMOGLOBIN A1C: 12.5 % — AB (ref 4.6–6.5)

## 2018-02-19 MED ORDER — METFORMIN HCL 500 MG PO TABS: 500.0000 mg | ORAL_TABLET | Freq: Two times a day (BID) | ORAL | 3 refills | Status: DC

## 2018-02-20 ENCOUNTER — Other Ambulatory Visit: Payer: Self-pay | Admitting: Family Medicine

## 2018-02-20 DIAGNOSIS — E119 Type 2 diabetes mellitus without complications: Secondary | ICD-10-CM

## 2018-02-23 DIAGNOSIS — M6281 Muscle weakness (generalized): Secondary | ICD-10-CM | POA: Diagnosis not present

## 2018-02-23 DIAGNOSIS — M25552 Pain in left hip: Secondary | ICD-10-CM | POA: Diagnosis not present

## 2018-02-23 DIAGNOSIS — M25652 Stiffness of left hip, not elsewhere classified: Secondary | ICD-10-CM | POA: Diagnosis not present

## 2018-02-26 DIAGNOSIS — M25552 Pain in left hip: Secondary | ICD-10-CM | POA: Diagnosis not present

## 2018-02-26 DIAGNOSIS — M25652 Stiffness of left hip, not elsewhere classified: Secondary | ICD-10-CM | POA: Diagnosis not present

## 2018-02-26 DIAGNOSIS — M6281 Muscle weakness (generalized): Secondary | ICD-10-CM | POA: Diagnosis not present

## 2018-02-27 DIAGNOSIS — M1612 Unilateral primary osteoarthritis, left hip: Secondary | ICD-10-CM | POA: Diagnosis not present

## 2018-03-03 ENCOUNTER — Telehealth: Payer: Self-pay

## 2018-03-03 ENCOUNTER — Telehealth: Payer: Self-pay | Admitting: Emergency Medicine

## 2018-03-03 ENCOUNTER — Ambulatory Visit: Payer: Self-pay | Admitting: *Deleted

## 2018-03-03 MED ORDER — GLUCOSE BLOOD VI STRP: ORAL_STRIP | 12 refills | Status: DC

## 2018-03-03 MED ORDER — CONTOUR MONITOR W/DEVICE KIT: 1.0000 | PACK | Freq: Every day | 1 refills | Status: AC

## 2018-03-03 MED ORDER — LANCETS MISC: 11 refills | Status: DC

## 2018-03-03 NOTE — Telephone Encounter (Signed)
Supplies has been sent to patient pharmacy.

## 2018-03-03 NOTE — Telephone Encounter (Signed)
Copied from CRM [id number]. Topic: General - Call Back - No Documentation >> Mar 03, 2018 12:57 PM Lynne LoganHudson, Caryn D wrote: Reason for CRM: Pt called and stated she has been newly diagnosed with Diabetes. She would like to know if Dr. Dayton MartesAron can suggest a blood glucose meter. Please advise. Cb# [phone]

## 2018-03-03 NOTE — Telephone Encounter (Signed)
Supplies has been sent to patient pharmacy per Dr. Dayton MartesAron

## 2018-03-03 NOTE — Addendum Note (Signed)
Addended by: Dominic PeaHOLSEY, Duel Conrad V on: 03/03/2018 04:08 PM   Modules accepted: Orders

## 2018-03-03 NOTE — Telephone Encounter (Signed)
Yes okay to send either one to her preferred pharmacy.

## 2018-03-03 NOTE — Telephone Encounter (Signed)
Pt agrees to the [facility] on [address] for her prescription for her meter.

## 2018-03-03 NOTE — Telephone Encounter (Signed)
Patient would like to know if One touch or Contour meter can be sent into local pharmacy so patient can check her blood sugar once daily. Please advise.

## 2018-03-03 NOTE — Telephone Encounter (Signed)
Pt called with newly being diagnosed with diabetes and starting on Metformin. She says for the last 4 nights she has awaken with night sweats and feeling faint. She does not have a meter and can not check her blood sugars.  She is taking the metformin as prescribed.  She has checked with her insurance companies and they have stated that there are 2 that she can chose from and would be free.  [facility] getting [facility] for this patient and will send a message to Dr. Dayton MartesAron. Home care advice given to patient regarding her food choices; cutting the carbs and switching to sugar free drinks and water from sugary drinks. Also she tends to eat her supper at 5 pm. Have a snack about an hour before bedtime, consisting of a protein and carb, like cheese and crackers or peanut butter and crackers.  Making sure she is drinking plenty of water to stay hydrated. Keep a hard piece of candy at her bedside for if she wakes up feeling sweaty and faint for now (until she gets her meter and can check her blood sugar). She is scheduled to see a nutritionist next week. Pt voiced understanding and will call back for any concerns or questions that she may have.

## 2018-03-10 ENCOUNTER — Encounter: Payer: Self-pay | Admitting: Dietician

## 2018-03-10 ENCOUNTER — Encounter: Payer: 59 | Attending: Family Medicine | Admitting: Dietician

## 2018-03-10 DIAGNOSIS — E119 Type 2 diabetes mellitus without complications: Secondary | ICD-10-CM | POA: Diagnosis not present

## 2018-03-10 DIAGNOSIS — Z713 Dietary counseling and surveillance: Secondary | ICD-10-CM | POA: Diagnosis not present

## 2018-03-10 DIAGNOSIS — Z6838 Body mass index (BMI) 38.0-38.9, adult: Secondary | ICD-10-CM | POA: Insufficient documentation

## 2018-03-10 NOTE — Progress Notes (Signed)
Patient was seen on 03/10/18 for the first of a series of three diabetes self-management courses at [facility].  Patient Education Plan per assessed needs and concerns is to attend three course education program for Diabetes Self Management Education.  The following learning objectives were met by the patient during this class:  Describe diabetes  State some common risk factors for diabetes  Defines the role of glucose and insulin  Identifies type of diabetes and pathophysiology  Describe the relationship between diabetes and cardiovascular risk  State the members of the Healthcare Team  States the rationale for glucose monitoring  State when to test glucose  State their individual Target Range  State the importance of logging glucose readings  Describe how to interpret glucose readings  Identifies A1C target  Explain the correlation between A1c and eAG values  State symptoms and treatment of high blood glucose  State symptoms and treatment of low blood glucose  Explain proper technique for glucose testing  Identifies proper sharps disposal  Handouts given during class include:  ADA Diabetes You Take Control   Carb Counting and Meal Planning book  Meal Plan Card  Meal planning worksheet  Low Sodium Flavoring Tips  Types of Fats  The diabetes portion plate  A1c to eAG Conversion Chart  Diabetes Recommended Care Schedule  Support Group  Diabetes Success Plan  Core Class Satisfaction Survey   Follow-Up Plan:  Attend core 2   

## 2018-03-11 ENCOUNTER — Ambulatory Visit (INDEPENDENT_AMBULATORY_CARE_PROVIDER_SITE_OTHER): Payer: 59 | Admitting: Family Medicine

## 2018-03-11 ENCOUNTER — Encounter: Payer: Self-pay | Admitting: Family Medicine

## 2018-03-11 ENCOUNTER — Ambulatory Visit: Payer: Self-pay

## 2018-03-11 VITALS — BP 156/98 | HR 98 | Temp 98.4°F | Ht 66.0 in

## 2018-03-11 DIAGNOSIS — E119 Type 2 diabetes mellitus without complications: Secondary | ICD-10-CM | POA: Diagnosis not present

## 2018-03-11 DIAGNOSIS — I10 Essential (primary) hypertension: Secondary | ICD-10-CM

## 2018-03-11 MED ORDER — LISINOPRIL 10 MG PO TABS: 10.0000 mg | ORAL_TABLET | Freq: Every day | ORAL | 3 refills | Status: DC

## 2018-03-11 NOTE — Telephone Encounter (Signed)
Incoming call from Patient, with a complaint of elevated blood pressures.  Patient states that Dr.  Dayton MartesAron took her off of Lisinopril due to low blood pressures.  The following values were obtained since Saturday.  149/108 HR 110; 147/104; HR 110 ;157/104 HR 110.  Patient has an automatic machine.  Denies headache, chest pain, blurred vision, difficulty breathing or weakness. Denies pregnancy.  Patient scheduled  for appointment 03/11/18 at 1040am  With Dr. Ruthe Mannanalia Aron.  Provided care advice.  Patient voiced understanding of appointment and care advice.     Reason for Disposition . [1] Systolic BP  >= 130 OR Diastolic >= 80 AND [2] taking BP medications  Answer Assessment - Initial Assessment Questions 1. BLOOD PRESSURE: "What is the blood pressure?" "Did you take at least two measurements 5 minutes apart?"     *No Answer* 2. ONSET: "When did you take your blood pressure?"      3days ago 3. HOW: "How did you obtain the blood pressure?" (e.g., visiting nurse, automatic home BP monitor)     automatic 4. HISTORY: "Do you have a history of high blood pressure?"     yes 5. MEDICATIONS: "Are you taking any medications for blood pressure?" "Have you missed any doses recently?"     lisinopril 6. OTHER SYMPTOMS: "Do you have any symptoms?" (e.g., headache, chest pain, blurred vision, difficulty breathing, weakness)     No sx 7. PREGNANCY: "Is there any chance you are pregnant?" "When was your last menstrual period?"     no  Protocols used: HIGH BLOOD PRESSURE-A-AH

## 2018-03-11 NOTE — Patient Instructions (Signed)
Great to see you. Let's restart Lisinopril 10 mg daily.  Great job on M.D.C. Holdingsyour diet, weight loss,and blood sugar.  Please keep me updated with your blood pressures.  I hope you have a Happy Thanksgiving.

## 2018-03-11 NOTE — Assessment & Plan Note (Signed)
Brenda Obrien is doing a great job- very recent diagnosis and has already committed to life style changes- lost 10 pounds, checking FSBS and taking Metformin 500 mg twice daily. She will continue to take her FSBS daily and keep me updated. I would have wanted to restart her ACEI even if her BP wasn't elevated today at low dose for renal protection.  See under hypertension. >25 minutes spent in face to face time with patient, >50% spent in counselling or coordination of care

## 2018-03-11 NOTE — Progress Notes (Signed)
Subjective:   Patient ID: Brenda Obrien, female    DOB: 04-17-1965, 52 y.o.   MRN: [id number]  Brenda Obrien is a pleasant 52 y.o. year old female who presents to clinic today with Hypertension (She has had elevated BP for past three days-today 161/104. Denies headaches. )  on 03/11/2018  HPI:  HTN- BP has been elevated for past 3 days- around 160s/100s.  Denies HA, blurred vision, CP or SOB.  No LE edema. She did feel dizzy which is why she bought a cuff 3 days ago and started checking her blood pressure. She is awaiting a left hip replacement and has been in more pain lately, she is unsure if this is part of her BP elevation.  I last saw her on 02/18/18- note reviewed. At that time, she was remaining orthostatic.  She endorsed feeling dizzy for about a month.  Had a near syncopal episode during PT prior to seeing me.  She had been taking Lisinopril 5 mg daily (down from 10 mg two weeks prior, 20 mg prior to that) and minipress 2 mg twice daily for PTSD (prescribed by psychiatry). We therefore stopped her lisinopril and continued her minipress.  Also diagnosed with DM at that [facility].  Lab Results  Component Value Date   HGBA1C 12.5 (H) 02/19/2018   Started her on Metformin 500 mg twice daily and referred her for diabetic teaching. Checking FSBS daily- it was 177 today. Denies hypoglycemia.  Changed diet- has switched to low carb.  Has lost 10 pounds.  Wt Readings from Last 3 Encounters:  03/10/18 241 lb (109.3 kg)  02/18/18 250 lb 12.8 oz (113.8 kg)  02/04/18 250 lb 6.4 oz (113.6 kg)   Current Outpatient Medications on File Prior to Visit  Medication Sig Dispense Refill  . amitriptyline (ELAVIL) 100 MG tablet Take 100 mg by mouth at bedtime.  4  . Blood Glucose Monitoring Suppl (CONTOUR MONITOR) w/Device KIT 1 Device by Does not apply route daily. Patient is to test once daily. Dx E11.9 1 kit 1  . Brexpiprazole (REXULTI) 3 MG TABS Take 3 mg by mouth daily.     . diclofenac  sodium (VOLTAREN) 1 % GEL Apply 3 gm to 3 large joints up to 3 times a day.Dispense 3 tubes with 3 refills. (Patient taking differently: Apply 3 g topically 3 (three) times daily as needed (For joints.). ) 5 Tube 0  . DULoxetine (CYMBALTA) 60 MG capsule Take 120 mg by mouth daily.     Marland Kitchen gabapentin (NEURONTIN) 600 MG tablet Take 1 tablet (600 mg total) by mouth 2 (two) times daily.    Marland Kitchen glucose blood (CONTOUR TEST) test strip Patient is to use to test once daily. E11.9 100 each 12  . hydrOXYzine (VISTARIL) 25 MG capsule Take 25 mg by mouth every 6 (six) hours as needed for anxiety.     . Lancets MISC Patient is to test once daily. Dx. E11.9 100 each 11  . metFORMIN (GLUCOPHAGE) 500 MG tablet Take 1 tablet (500 mg total) by mouth 2 (two) times daily with a meal. 180 tablet 3  . methocarbamol (ROBAXIN) 500 MG tablet Take 1 tablet (500 mg total) by mouth 2 (two) times daily. 30 tablet 0  . omeprazole (PRILOSEC) 40 MG capsule Take 1 capsule (40 mg total) by mouth daily. 90 capsule 3  . oxyCODONE-acetaminophen (PERCOCET/ROXICET) 5-325 MG tablet Take 2 tablets by mouth every 4 (four) hours as needed for severe pain. 4 tablet 0  .  prazosin (MINIPRESS) 2 MG capsule Take 2 mg by mouth 2 (two) times daily.    . simvastatin (ZOCOR) 20 MG tablet Take 1 tablet (20 mg total) by mouth daily. 90 tablet 3  . amoxicillin (AMOXIL) 500 MG capsule Take 1,000 mg by mouth as needed (For dental appointments.).      No current facility-administered medications on file prior to visit.     No Known Allergies  Past Medical History:  Diagnosis Date  . Asthma   . Cataract   . Depression   . Elevated cholesterol   . Endometriosis   . Generalized anxiety disorder 2017  . GERD (gastroesophageal reflux disease)   . Glaucoma   . 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 Hypertension   . PTSD (post-traumatic stress disorder) 2017    Past Surgical History:  Procedure Laterality Date  . ANTERIOR CRUCIATE LIGAMENT REPAIR   1987,2001,2003,2005  . BACK SURGERY  01/2017   due herniated disc  . EYE SURGERY Bilateral 2016   release pressure   . LAPAROSCOPIC CHOLECYSTECTOMY  5/14   [place], [place]  . LAPAROSCOPIC TOTAL HYSTERECTOMY  6/11  . left knee surgery  09/2011  . NASAL SINUS SURGERY  1992  . REPLACEMENT TOTAL KNEE Left 9/14   Dr. Laurance Flatten  . SHOULDER SURGERY Right 09/08/2015   due to injury   . SHOULDER SURGERY Right 03/20/2016   due to injury  . SHOULDER SURGERY Left 09/10/2017   due rotator cuff    Family History  Problem Relation Age of Onset  . Coronary artery disease Mother        PAD  . Heart attack Mother 66  . Diabetes Mother   . Lung cancer Father   . Heart attack Father 83  . Hypertension Brother   . High Cholesterol Brother     Social History   Socioeconomic History  . Marital status: Single    Spouse name: Not on file  . Number of children: Not on file  . Years of education: Not on file  . Highest education level: Not on file  Occupational History  . 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 Financial resource strain: Not on file  . Food insecurity:    Worry: Not on file    Inability: Not on file  . Transportation needs:    Medical: Not on file    Non-medical: Not on file  Tobacco Use  . Smoking status: Current Every Day Smoker    Packs/day: 1.00    Types: Cigarettes  . Smokeless tobacco: Never Used  Substance and Sexual Activity  . Alcohol use: Yes    Alcohol/week: 12.0 standard drinks    Types: 12 Cans of beer per week    Comment: Socially  . Drug use: Never  . Sexual activity: Not Currently    Birth control/protection: Surgical    Comment: hysterectomy  Lifestyle  . Physical activity:    Days per week: Not on file    Minutes per session: Not on file  . Stress: Not on file  Relationships  . Social connections:    Talks on phone: Not on file    Gets together: Not on file    Attends religious  service: Not on file    Active member of club or organization: Not on file    Attends meetings of clubs or organizations: Not on file    Relationship status: Not on file  .  Intimate partner violence:    Fear of current or ex partner: Not on file    Emotionally abused: Not on file    Physically abused: Not on file    Forced sexual activity: Not on file  Other Topics Concern  . Not on file  Social History Narrative   Single      No regular exercise      Works as Pharmacist, community for [employer]         The [place], [place], Social History, Family History, Medications, and allergies have been reviewed in [facility], and have been updated if relevant.   Review of Systems  Constitutional: Negative.   Respiratory: Negative.   Cardiovascular: Negative.   Gastrointestinal: Negative.   Musculoskeletal: Positive for arthralgias.  Neurological: Positive for dizziness. Negative for tremors, seizures, syncope, facial asymmetry, speech difficulty, weakness, light-headedness, numbness and headaches.  All other systems reviewed and are negative.      Objective:    BP (!) 156/98   Pulse 98   Temp 98.4 F (36.9 C) (Oral)   Ht _0  (1.676 m)   SpO2 98%   BMI 38.90 kg/m    Physical Exam   General:  Well-developed,well-nourished,in no acute distress; alert,appropriate and cooperative throughout examination Head:  normocephalic and atraumatic.   Eyes:  vision grossly intact, PERRL Ears:  R ear normal and L ear normal externally, TMs clear bilaterally Nose:  no external deformity.   Mouth:  good dentition.   Neck:  No deformities, masses, or tenderness noted. Lungs:  Normal respiratory effort, chest expands symmetrically. Lungs are clear to auscultation, no crackles or wheezes. Heart:  Normal rate and regular rhythm. S1 and S2 normal without gallop, murmur, click, rub or other extra sounds. Msk:  No deformity or scoliosis noted of thoracic or lumbar spine.   Extremities:  No  clubbing, cyanosis, edema, or deformity noted with normal full range of motion of all joints.   Neurologic:  alert & oriented X3 and gait normal.   Skin:  Intact without suspicious lesions or rashes Psych:  Cognition and judgment appear intact. Alert and cooperative with normal attention span and concentration. No apparent delusions, illusions, hallucinations      Assessment & Plan:   Essential hypertension  Diabetes mellitus, new onset ([place]) No follow-ups on file.

## 2018-03-11 NOTE — Assessment & Plan Note (Signed)
Deteriorated. Likely multifactorial- pain, I also suspect recent DM diagnosis is part of the hypotension she was experiencing. Restart lisinopril 10 mg daily.  She will continue to check her BP daily and to update me. Keep follow up appointment already scheduled in 05/2018.

## 2018-03-16 ENCOUNTER — Encounter: Payer: Self-pay | Admitting: Family Medicine

## 2018-03-17 ENCOUNTER — Encounter: Payer: 59 | Attending: Family Medicine | Admitting: Dietician

## 2018-03-17 ENCOUNTER — Encounter: Payer: Self-pay | Admitting: Dietician

## 2018-03-17 ENCOUNTER — Other Ambulatory Visit: Payer: Self-pay

## 2018-03-17 DIAGNOSIS — Z713 Dietary counseling and surveillance: Secondary | ICD-10-CM | POA: Insufficient documentation

## 2018-03-17 DIAGNOSIS — Z6838 Body mass index (BMI) 38.0-38.9, adult: Secondary | ICD-10-CM | POA: Insufficient documentation

## 2018-03-17 DIAGNOSIS — E119 Type 2 diabetes mellitus without complications: Secondary | ICD-10-CM | POA: Diagnosis not present

## 2018-03-17 MED ORDER — LISINOPRIL 20 MG PO TABS: 20.0000 mg | ORAL_TABLET | Freq: Every day | ORAL | 1 refills | Status: DC

## 2018-03-17 NOTE — Progress Notes (Signed)
Patient was the only attendee to class today and was able to complete information from both core classes.  She is currently following a low carbohydrate diet with goals of improving her blood sugar.  She states she will then integrate a few more carbohydrates. She has lost 9 lbs.  She needs a hip replacement and this will happen when she has lost weight and her blood sugar is controlled.  Patient was seen on 03/17/18 for the second of a series of three diabetes self-management courses at the [facility]. The following learning objectives were met by the patient during this class:   Describe the role of different macronutrients on glucose  Explain how carbohydrates affect blood glucose  State what foods contain the most carbohydrates  Demonstrate carbohydrate counting  Demonstrate how to read Nutrition Facts food label  Describe effects of various fats on heart health  Describe the importance of good nutrition for health and healthy eating strategies  Describe techniques for managing your shopping, cooking and meal planning  List strategies to follow meal plan when dining out  Describe the effects of alcohol on glucose and how to use it safely  Goals:  Follow Diabetes Meal Plan as instructed  Aim to spread carbs evenly throughout the day  Aim for 3 meals per day and snacks as needed Include lean protein foods to meals/snacks  Monitor glucose levels as instructed by your doctor   Follow-Up Plan:  Attend Core 3  Work towards following your personal food plan.   Patient was seen on 03/17/18 for the third of a series of three diabetes self-management courses at the [facility].   Catalina Gravel the amount of activity recommended for healthy living . Describe activities suitable for individual needs . Identify ways to regularly incorporate activity into daily life . Identify barriers to activity and ways to over come these  barriers  Identify diabetes medications being personally used and their primary action for lowering glucose and possible side effects . Describe role of stress on blood glucose and develop strategies to address psychosocial issues . Identify diabetes complications and ways to prevent them  Explain how to manage diabetes during illness . Evaluate success in meeting personal goal . Establish 2-3 goals that they will plan to diligently work on  Goals:   I will count my carb choices at most meals and snacks  I will be active 30 minutes or more 5 times a week  Work on managing stress  Your patient has identified these potential barriers to change:  Motivation Finances  Your patient has identified their diabetes self-care support plan as  On-line Resources   Plan:  Attend Support Group as desired

## 2018-03-23 ENCOUNTER — Encounter: Payer: Self-pay | Admitting: Family Medicine

## 2018-03-23 DIAGNOSIS — M19012 Primary osteoarthritis, left shoulder: Secondary | ICD-10-CM | POA: Diagnosis not present

## 2018-03-24 ENCOUNTER — Ambulatory Visit: Payer: Self-pay

## 2018-03-25 ENCOUNTER — Encounter: Payer: Self-pay | Admitting: Family Medicine

## 2018-03-27 ENCOUNTER — Encounter: Payer: Self-pay | Admitting: Family Medicine

## 2018-04-02 ENCOUNTER — Encounter: Payer: Self-pay | Admitting: Family Medicine

## 2018-04-02 DIAGNOSIS — M19012 Primary osteoarthritis, left shoulder: Secondary | ICD-10-CM | POA: Diagnosis not present

## 2018-04-06 ENCOUNTER — Encounter: Payer: Self-pay | Admitting: Family Medicine

## 2018-04-10 ENCOUNTER — Encounter: Payer: Self-pay | Admitting: Family Medicine

## 2018-04-13 ENCOUNTER — Encounter: Payer: Self-pay | Admitting: Family Medicine

## 2018-04-21 ENCOUNTER — Encounter: Payer: Self-pay | Admitting: Family Medicine

## 2018-04-24 ENCOUNTER — Encounter: Payer: Self-pay | Admitting: Family Medicine

## 2018-04-27 ENCOUNTER — Encounter: Payer: Self-pay | Admitting: Family Medicine

## 2018-04-27 DIAGNOSIS — Z419 Encounter for procedure for purposes other than remedying health state, unspecified: Secondary | ICD-10-CM | POA: Diagnosis not present

## 2018-04-27 DIAGNOSIS — E119 Type 2 diabetes mellitus without complications: Secondary | ICD-10-CM | POA: Diagnosis not present

## 2018-04-27 DIAGNOSIS — E785 Hyperlipidemia, unspecified: Secondary | ICD-10-CM | POA: Insufficient documentation

## 2018-04-27 DIAGNOSIS — Z7984 Long term (current) use of oral hypoglycemic drugs: Secondary | ICD-10-CM | POA: Diagnosis not present

## 2018-04-27 DIAGNOSIS — M19012 Primary osteoarthritis, left shoulder: Secondary | ICD-10-CM | POA: Diagnosis not present

## 2018-04-28 ENCOUNTER — Encounter: Payer: Self-pay | Admitting: Family Medicine

## 2018-05-01 ENCOUNTER — Encounter: Payer: Self-pay | Admitting: Family Medicine

## 2018-05-05 ENCOUNTER — Encounter: Payer: Self-pay | Admitting: Family Medicine

## 2018-05-08 ENCOUNTER — Encounter: Payer: Self-pay | Admitting: Family Medicine

## 2018-05-13 ENCOUNTER — Encounter: Payer: Self-pay | Admitting: Family Medicine

## 2018-05-15 DIAGNOSIS — E785 Hyperlipidemia, unspecified: Secondary | ICD-10-CM | POA: Diagnosis not present

## 2018-05-15 DIAGNOSIS — M19012 Primary osteoarthritis, left shoulder: Secondary | ICD-10-CM | POA: Diagnosis not present

## 2018-05-15 DIAGNOSIS — G8918 Other acute postprocedural pain: Secondary | ICD-10-CM | POA: Diagnosis not present

## 2018-05-15 DIAGNOSIS — I10 Essential (primary) hypertension: Secondary | ICD-10-CM | POA: Diagnosis not present

## 2018-05-26 ENCOUNTER — Ambulatory Visit: Payer: 59 | Admitting: Family Medicine

## 2018-05-26 ENCOUNTER — Encounter: Payer: Self-pay | Admitting: Family Medicine

## 2018-05-26 VITALS — BP 124/74 | HR 104 | Temp 98.8°F | Ht 66.0 in | Wt 221.2 lb

## 2018-05-26 DIAGNOSIS — Z23 Encounter for immunization: Secondary | ICD-10-CM | POA: Diagnosis not present

## 2018-05-26 DIAGNOSIS — I10 Essential (primary) hypertension: Secondary | ICD-10-CM | POA: Diagnosis not present

## 2018-05-26 DIAGNOSIS — R000 Tachycardia, unspecified: Secondary | ICD-10-CM

## 2018-05-26 DIAGNOSIS — E119 Type 2 diabetes mellitus without complications: Secondary | ICD-10-CM | POA: Diagnosis not present

## 2018-05-26 LAB — POCT GLYCOSYLATED HEMOGLOBIN (HGB A1C)
HEMOGLOBIN A1C: 6.9 % (ref 4.0–5.6)
Hemoglobin A1C: 6.9 % — AB (ref 4.0–5.6)

## 2018-05-26 MED ORDER — NEBIVOLOL HCL 2.5 MG PO TABS: 2.5000 mg | ORAL_TABLET | Freq: Every day | ORAL | 0 refills | Status: DC

## 2018-05-26 MED ORDER — LISINOPRIL 10 MG PO TABS: 10.0000 mg | ORAL_TABLET | Freq: Every day | ORAL | 3 refills | Status: DC

## 2018-05-26 NOTE — Assessment & Plan Note (Signed)
Persistent with no irregular rhythms evident. Will add low dose beta blocker (bystolic), decrease lisinopril to 10 mg daily. She is good about checking her BP and pulse daily and sending me mychart messages. She will continue to do this.

## 2018-05-26 NOTE — Assessment & Plan Note (Signed)
Well controlled but with persistent tachycardia. See above.

## 2018-05-26 NOTE — Progress Notes (Signed)
Subjective:   Patient ID: Brenda Obrien, female    DOB: 06/09/1965, 53 y.o.   MRN: [id number]  Brenda Obrien is a pleasant 53 y.o. year old female who presents to clinic today with Diabetes (Patient is here today to F/U with DM.  On 11.7.19 her A1C was 12.5.  She has been taking Metformin 577m bid and communicating via MyChart her BS which have been quite well but since left shoulder surgery they have not been doing as well.  She would like to have you look at her incisions on left shoulder.)  on 05/26/2018  HPI:  DM- Here for diabetes follow up.  Last seen on 02/19/18- a1c was 12.5.  She is taking Metformin 500 mg twice daily and check FSBS twice daily. Also has been seeing nutritionist and losing weight. Lab Results  Component Value Date   HGBA1C 6.9 (A) 05/26/2018   HGBA1C 6.9 05/26/2018   Wt Readings from Last 3 Encounters:  05/26/18 221 lb 3.2 oz (100.3 kg)  03/10/18 241 lb (109.3 kg)  02/18/18 250 lb 12.8 oz (113.8 kg)   Shoulder surgery went well on 05/15/18.  HTN- has been controlled on lisinopril 20 mg daily.  Also takes minipress for PTSD.  Tachycardia- persistent issue and has been for years.  Recent surgery, EKG and monitors did not show SVT or irregular rhythms.  Lab Results  Component Value Date   TSH 1.22 02/18/2018     Current Outpatient Medications on File Prior to Visit  Medication Sig Dispense Refill  . amitriptyline (ELAVIL) 100 MG tablet Take 100 mg by mouth at bedtime.  4  . amoxicillin (AMOXIL) 500 MG capsule Take 1,000 mg by mouth as needed (For dental appointments.).     .Marland KitchenBlood Glucose Monitoring Suppl (CONTOUR MONITOR) w/Device KIT 1 Device by Does not apply route daily. Patient is to test once daily. Dx E11.9 1 kit 1  . diclofenac sodium (VOLTAREN) 1 % GEL Apply 3 gm to 3 large joints up to 3 times a day.Dispense 3 tubes with 3 refills. (Patient taking differently: Apply 3 g topically 3 (three) times daily as needed (For joints.). ) 5 Tube 0  .  DULoxetine (CYMBALTA) 60 MG capsule Take 120 mg by mouth daily.     .Marland Kitchengabapentin (NEURONTIN) 600 MG tablet Take 1 tablet (600 mg total) by mouth 2 (two) times daily.    .Marland Kitchenglucose blood (CONTOUR TEST) test strip Patient is to use to test once daily. E11.9 100 each 12  . hydrOXYzine (VISTARIL) 25 MG capsule Take 25 mg by mouth every 6 (six) hours as needed for anxiety.     . Lancets MISC Patient is to test once daily. Dx. E11.9 100 each 11  . lisinopril (PRINIVIL,ZESTRIL) 20 MG tablet Take 1 tablet (20 mg total) by mouth daily. 90 tablet 1  . metFORMIN (GLUCOPHAGE) 500 MG tablet Take 1 tablet (500 mg total) by mouth 2 (two) times daily with a meal. 180 tablet 3  . methocarbamol (ROBAXIN) 500 MG tablet Take 1 tablet (500 mg total) by mouth 2 (two) times daily. 30 tablet 0  . omeprazole (PRILOSEC) 40 MG capsule Take 1 capsule (40 mg total) by mouth daily. 90 capsule 3  . prazosin (MINIPRESS) 2 MG capsule Take 2 mg by mouth 2 (two) times daily.    . simvastatin (ZOCOR) 20 MG tablet Take 1 tablet (20 mg total) by mouth daily. 90 tablet 3   No current facility-administered medications on file  prior to visit.     No Known Allergies  Past Medical History:  Diagnosis Date  . Asthma   . Cataract   . Depression   . Elevated cholesterol   . Endometriosis   . Generalized anxiety disorder 2017  . GERD (gastroesophageal reflux disease)   . Glaucoma   . 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 Hypertension   . PTSD (post-traumatic stress disorder) 2017    Past Surgical History:  Procedure Laterality Date  . ANTERIOR CRUCIATE LIGAMENT REPAIR  1987,2001,2003,2005  . BACK SURGERY  01/2017   due herniated disc  . EYE SURGERY Bilateral 2016   release pressure   . LAPAROSCOPIC CHOLECYSTECTOMY  5/14   [place], [place]  . LAPAROSCOPIC TOTAL HYSTERECTOMY  6/11  . left knee surgery  09/2011  . NASAL SINUS SURGERY  1992  . REPLACEMENT TOTAL KNEE Left 9/14   Dr. Laurance Flatten  . SHOULDER SURGERY Right 09/08/2015   due to  injury   . SHOULDER SURGERY Right 03/20/2016   due to injury  . SHOULDER SURGERY Left 09/10/2017   due rotator cuff    Family History  Problem Relation Age of Onset  . Coronary artery disease Mother        PAD  . Heart attack Mother 57  . Diabetes Mother   . Lung cancer Father   . Heart attack Father 42  . Hypertension Brother   . High Cholesterol Brother     Social History   Socioeconomic History  . Marital status: Single    Spouse name: Not on file  . Number of children: Not on file  . Years of education: Not on file  . Highest education level: Not on file  Occupational History  . 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 Financial resource strain: Not on file  . Food insecurity:    Worry: Not on file    Inability: Not on file  . Transportation needs:    Medical: Not on file    Non-medical: Not on file  Tobacco Use  . Smoking status: Current Every Day Smoker    Packs/day: 1.00    Types: Cigarettes  . Smokeless tobacco: Never Used  Substance and Sexual Activity  . Alcohol use: Yes    Alcohol/week: 12.0 standard drinks    Types: 12 Cans of beer per week    Comment: Socially  . Drug use: Never  . Sexual activity: Not Currently    Birth control/protection: Surgical    Comment: hysterectomy  Lifestyle  . Physical activity:    Days per week: Not on file    Minutes per session: Not on file  . Stress: Not on file  Relationships  . Social connections:    Talks on phone: Not on file    Gets together: Not on file    Attends religious service: Not on file    Active member of club or organization: Not on file    Attends meetings of clubs or organizations: Not on file    Relationship status: Not on file  . Intimate partner violence:    Fear of current or ex partner: Not on file    Emotionally abused: Not on file    Physically abused: Not on file    Forced sexual activity: Not on file  Other Topics Concern    . Not on file  Social History Narrative   Single  No regular exercise      Works as Pharmacist, community for [employer]         [place], [place], Social History, Family History, Medications, and allergies have been reviewed in [facility], and have been updated if relevant.   Review of Systems  Constitutional: Negative.   HENT: Negative.   Eyes: Negative.   Respiratory: Negative.   Cardiovascular: Positive for palpitations. Negative for chest pain and leg swelling.  Gastrointestinal: Negative.   Endocrine: Negative.   Genitourinary: Negative.   Musculoskeletal: Negative.   Allergic/Immunologic: Negative.   Neurological: Negative.   Hematological: Negative.   Psychiatric/Behavioral: Negative.   All other systems reviewed and are negative.      Objective:    BP 124/74 (BP Location: Right Arm, Patient Position: Sitting, Cuff Size: Normal)   Pulse (!) 104   Temp 98.8 F (37.1 C) (Oral)   Ht 5' 6"  (1.676 m)   Wt 221 lb 3.2 oz (100.3 kg)   SpO2 96%   BMI 35.70 kg/m    Physical Exam Vitals signs and nursing note reviewed.  Constitutional:      Appearance: Normal appearance.  HENT:     Head: Normocephalic and atraumatic.     Nose: Nose normal.     Mouth/Throat:     Mouth: Mucous membranes are moist.  Eyes:     Extraocular Movements: Extraocular movements intact.  Cardiovascular:     Rate and Rhythm: Tachycardia present.  Pulmonary:     Effort: Pulmonary effort is normal.  Skin:    General: Skin is warm and dry.     Comments: Sutures on left shoulder c/d/i  Neurological:     General: No focal deficit present.     Mental Status: She is alert.  Psychiatric:        Mood and Affect: Mood normal.        Behavior: Behavior normal.        Thought Content: Thought content normal.        Judgment: Judgment normal.           Assessment & Plan:   Diabetes mellitus, new onset ([place]) - Plan: POCT HgB A1C, Comp Met (CMET)  Need for pneumococcal vaccination -  Plan: Pneumococcal polysaccharide vaccine 23-valent greater than or equal to 2yo subcutaneous/IM  Essential hypertension  Tachycardia No follow-ups on file.

## 2018-05-26 NOTE — Patient Instructions (Addendum)
Great to see you. Great work!!  We are decreasing your lisinopril to 10 mg daily and adding bystolic 2.5 mg daily.  Keep checking blood pressure and pulse at home.

## 2018-05-26 NOTE — Assessment & Plan Note (Signed)
Doing very well- has brought a1c down tremendously with diet and she is compliant with metformin. Continue current dose and checking daily FSBS. Follow up in 3 months. The patient indicates understanding of these issues and agrees with the plan.

## 2018-06-01 ENCOUNTER — Encounter: Payer: Self-pay | Admitting: Family Medicine

## 2018-06-04 ENCOUNTER — Encounter: Payer: Self-pay | Admitting: Family Medicine

## 2018-06-08 ENCOUNTER — Encounter: Payer: Self-pay | Admitting: Family Medicine

## 2018-06-12 ENCOUNTER — Other Ambulatory Visit: Payer: Self-pay

## 2018-06-12 MED ORDER — NEBIVOLOL HCL 5 MG PO TABS: ORAL_TABLET | ORAL | 2 refills | Status: DC

## 2018-06-12 NOTE — Progress Notes (Signed)
Stop 2.5mg /Start 5mg  1qd/Rx sent per TA/thx dmf

## 2018-06-15 ENCOUNTER — Encounter: Payer: Self-pay | Admitting: Family Medicine

## 2018-06-16 ENCOUNTER — Encounter: Payer: Self-pay | Admitting: Family Medicine

## 2018-06-18 ENCOUNTER — Encounter: Payer: Self-pay | Admitting: Family Medicine

## 2018-06-19 ENCOUNTER — Encounter: Payer: Self-pay | Admitting: Family Medicine

## 2018-06-20 ENCOUNTER — Other Ambulatory Visit: Payer: Self-pay | Admitting: Family Medicine

## 2018-06-23 ENCOUNTER — Encounter: Payer: Self-pay | Admitting: Family Medicine

## 2018-06-29 ENCOUNTER — Encounter: Payer: Self-pay | Admitting: Family Medicine

## 2018-06-29 DIAGNOSIS — M50322 Other cervical disc degeneration at C5-C6 level: Secondary | ICD-10-CM | POA: Diagnosis not present

## 2018-07-06 ENCOUNTER — Encounter: Payer: Self-pay | Admitting: Family Medicine

## 2018-07-20 DIAGNOSIS — M25512 Pain in left shoulder: Secondary | ICD-10-CM | POA: Diagnosis not present

## 2018-07-20 DIAGNOSIS — M47812 Spondylosis without myelopathy or radiculopathy, cervical region: Secondary | ICD-10-CM | POA: Diagnosis not present

## 2018-07-20 DIAGNOSIS — M5030 Other cervical disc degeneration, unspecified cervical region: Secondary | ICD-10-CM | POA: Diagnosis not present

## 2018-07-20 DIAGNOSIS — M75102 Unspecified rotator cuff tear or rupture of left shoulder, not specified as traumatic: Secondary | ICD-10-CM | POA: Diagnosis not present

## 2018-07-20 DIAGNOSIS — G548 Other nerve root and plexus disorders: Secondary | ICD-10-CM | POA: Diagnosis not present

## 2018-07-20 DIAGNOSIS — Z9889 Other specified postprocedural states: Secondary | ICD-10-CM | POA: Diagnosis not present

## 2018-08-04 DIAGNOSIS — M4802 Spinal stenosis, cervical region: Secondary | ICD-10-CM | POA: Diagnosis not present

## 2018-08-04 DIAGNOSIS — M5030 Other cervical disc degeneration, unspecified cervical region: Secondary | ICD-10-CM | POA: Diagnosis not present

## 2018-08-10 DIAGNOSIS — M542 Cervicalgia: Secondary | ICD-10-CM | POA: Diagnosis not present

## 2018-08-10 DIAGNOSIS — M4802 Spinal stenosis, cervical region: Secondary | ICD-10-CM | POA: Diagnosis not present

## 2018-08-10 DIAGNOSIS — M6281 Muscle weakness (generalized): Secondary | ICD-10-CM | POA: Diagnosis not present

## 2018-08-13 DIAGNOSIS — M4802 Spinal stenosis, cervical region: Secondary | ICD-10-CM | POA: Diagnosis not present

## 2018-08-13 DIAGNOSIS — M542 Cervicalgia: Secondary | ICD-10-CM | POA: Diagnosis not present

## 2018-08-13 DIAGNOSIS — M6281 Muscle weakness (generalized): Secondary | ICD-10-CM | POA: Diagnosis not present

## 2018-08-17 DIAGNOSIS — M75112 Incomplete rotator cuff tear or rupture of left shoulder, not specified as traumatic: Secondary | ICD-10-CM | POA: Diagnosis not present

## 2018-08-17 DIAGNOSIS — M7542 Impingement syndrome of left shoulder: Secondary | ICD-10-CM | POA: Diagnosis not present

## 2018-08-18 DIAGNOSIS — M5030 Other cervical disc degeneration, unspecified cervical region: Secondary | ICD-10-CM | POA: Diagnosis not present

## 2018-08-18 DIAGNOSIS — M6281 Muscle weakness (generalized): Secondary | ICD-10-CM | POA: Diagnosis not present

## 2018-08-18 DIAGNOSIS — M4802 Spinal stenosis, cervical region: Secondary | ICD-10-CM | POA: Diagnosis not present

## 2018-08-18 DIAGNOSIS — M542 Cervicalgia: Secondary | ICD-10-CM | POA: Diagnosis not present

## 2018-08-20 DIAGNOSIS — M542 Cervicalgia: Secondary | ICD-10-CM | POA: Diagnosis not present

## 2018-08-20 DIAGNOSIS — M4802 Spinal stenosis, cervical region: Secondary | ICD-10-CM | POA: Diagnosis not present

## 2018-08-20 DIAGNOSIS — M5030 Other cervical disc degeneration, unspecified cervical region: Secondary | ICD-10-CM | POA: Diagnosis not present

## 2018-08-20 DIAGNOSIS — M6281 Muscle weakness (generalized): Secondary | ICD-10-CM | POA: Diagnosis not present

## 2018-08-24 ENCOUNTER — Ambulatory Visit (INDEPENDENT_AMBULATORY_CARE_PROVIDER_SITE_OTHER): Payer: Medicare Other | Admitting: Family Medicine

## 2018-08-24 ENCOUNTER — Encounter: Payer: Self-pay | Admitting: Family Medicine

## 2018-08-24 VITALS — BP 138/89 | HR 85 | Wt 225.0 lb

## 2018-08-24 DIAGNOSIS — E119 Type 2 diabetes mellitus without complications: Secondary | ICD-10-CM | POA: Diagnosis not present

## 2018-08-24 DIAGNOSIS — I10 Essential (primary) hypertension: Secondary | ICD-10-CM | POA: Diagnosis not present

## 2018-08-24 DIAGNOSIS — E785 Hyperlipidemia, unspecified: Secondary | ICD-10-CM

## 2018-08-24 DIAGNOSIS — M4722 Other spondylosis with radiculopathy, cervical region: Secondary | ICD-10-CM | POA: Insufficient documentation

## 2018-08-24 DIAGNOSIS — M50922 Unspecified cervical disc disorder at C5-C6 level: Secondary | ICD-10-CM | POA: Diagnosis not present

## 2018-08-24 NOTE — Assessment & Plan Note (Signed)
BP has stabilized. No changes made to current rxs.

## 2018-08-24 NOTE — Assessment & Plan Note (Signed)
She feels it has deteriorated but fasting FSBS are still at goal. Will check a1c. On ACEI and statin.

## 2018-08-24 NOTE — Progress Notes (Signed)
Virtual Visit via Video   Due to the COVID-19 pandemic, this visit was completed with telemedicine (audio/video) technology to reduce patient and provider exposure as well as to preserve personal protective equipment.   I connected with Brenda Obrien by a video enabled telemedicine application and verified that I am speaking with the correct person using two identifiers. Location patient: Home Location provider: [facility], Office Persons participating in the virtual visit: Campbell Lerner, MD   I discussed the limitations of evaluation and management by telemedicine and the availability of in person appointments. The patient expressed understanding and agreed to proceed.  Care Team   Patient Care Team: Lucille Passy, MD as PCP - General (Family Medicine) Megan Salon, MD as Consulting Physician (Gynecology)  Subjective:   HPI:   3 month follow up.  HTN- at last OV, Lisinopril was decreased to 10 mg daily and Bystolic 2.5 mg was added (and increased to 5 mg daily). BP is a little elevated in the morning prior to taking her medication but then it "rights itself.  DM- FSBS was 121 this morning. Usually runs between 117-130 fasting.  Currently taking Metformin 500 mg twice daily.   Checks FSBS daily. Denies any episodes of hypoglycemia. Lab Results  Component Value Date   HGBA1C 6.9 (A) 05/26/2018   HGBA1C 6.9 05/26/2018   Lab Results  Component Value Date   CREATININE 0.75 02/18/2018   HLD- taking zocor 20 mg daily. Lab Results  Component Value Date   CHOL 212 (H) 02/18/2018   HDL 44.90 02/18/2018   LDLCALC 122 (H) 01/16/2017   LDLDIRECT 161.0 02/18/2018   TRIG 253.0 (H) 02/18/2018   CHOLHDL 5 02/18/2018    She has been Dx with severe C5-6 stenosis which they feel is the culprit to her left shoulder which radiates down to hand now. They are doing traction and she is doing PT. She has follow up in June.    Review of Systems  Constitutional:  Negative.   HENT: Negative for hearing loss.   Eyes: Negative.   Respiratory: Negative.   Cardiovascular: Negative.   Gastrointestinal: Negative.   Genitourinary: Negative.   Musculoskeletal: Positive for neck pain.  Skin: Negative.   Neurological: Positive for tingling and sensory change. Negative for dizziness, speech change, focal weakness, seizures, loss of consciousness, weakness and headaches.  Endo/Heme/Allergies: Negative.   All other systems reviewed and are negative.    Patient Active Problem List   Diagnosis Date Noted  . Diabetes mellitus, new onset ([place]) 03/11/2018  . Hypotension 02/04/2018  . Primary osteoarthritis of right knee 10/24/2017  . Primary osteoarthritis of both hands 10/24/2017  . Primary osteoarthritis of both hips 10/24/2017  . DDD (degenerative disc disease), lumbar 11/18/2016  . Severe episode of recurrent major depressive disorder, without psychotic features ([place]) 03/15/2016  . MDD (major depressive disorder), recurrent episode, severe ([place]) 03/15/2016  . Insomnia 12/27/2015  . Adhesive capsulitis of right shoulder 12/21/2015  . Obesity, unspecified 09/20/2013  . Carpal tunnel syndrome 09/20/2013  . Allergic rhinitis 09/20/2013  . H/O total knee replacement 01/25/2013  . Depression 12/23/2012  . Anxiety 03/13/2012  . Hyperglycemia 02/03/2012  . Hypertension 01/21/2012  . Anterior cruciate ligament complete tear 07/01/2011  . Tachycardia 11/22/2010  . Dyslipidemia 11/22/2010  . GERD 03/27/2009    Social History   Tobacco Use  . Smoking status: Current Every Day Smoker    Packs/day: 1.00    Types: Cigarettes  . Smokeless  tobacco: Never Used  Substance Use Topics  . Alcohol use: Yes    Alcohol/week: 12.0 standard drinks    Types: 12 Cans of beer per week    Comment: Socially    Current Outpatient Medications:  .  amitriptyline (ELAVIL) 100 MG tablet, Take 100 mg by mouth at bedtime., Disp: , Rfl: 4 .  amoxicillin (AMOXIL) 500 MG  capsule, Take 1,000 mg by mouth as needed (For dental appointments.). , Disp: , Rfl:  .  Blood Glucose Monitoring Suppl (CONTOUR MONITOR) w/Device KIT, 1 Device by Does not apply route daily. Patient is to test once daily. Dx E11.9, Disp: 1 kit, Rfl: 1 .  DULoxetine (CYMBALTA) 60 MG capsule, Take 120 mg by mouth daily. , Disp: , Rfl:  .  gabapentin (NEURONTIN) 600 MG tablet, Take 1 tablet (600 mg total) by mouth 2 (two) times daily., Disp: , Rfl:  .  glucose blood (CONTOUR TEST) test strip, Patient is to use to test once daily. E11.9, Disp: 100 each, Rfl: 12 .  hydrOXYzine (VISTARIL) 25 MG capsule, Take 25 mg by mouth every 6 (six) hours as needed for anxiety. , Disp: , Rfl:  .  Lancets MISC, Patient is to test once daily. Dx. E11.9, Disp: 100 each, Rfl: 11 .  lisinopril (PRINIVIL,ZESTRIL) 10 MG tablet, Take 1 tablet (10 mg total) by mouth daily., Disp: 90 tablet, Rfl: 3 .  metFORMIN (GLUCOPHAGE) 500 MG tablet, Take 1 tablet (500 mg total) by mouth 2 (two) times daily with a meal., Disp: 180 tablet, Rfl: 3 .  methocarbamol (ROBAXIN) 500 MG tablet, Take 1 tablet (500 mg total) by mouth 2 (two) times daily., Disp: 30 tablet, Rfl: 0 .  nebivolol (BYSTOLIC) 5 MG tablet, Stop 2.'5mg'$ /Start '5mg'$  tablets 1qd, Disp: 30 tablet, Rfl: 2 .  omeprazole (PRILOSEC) 40 MG capsule, Take 1 capsule (40 mg total) by mouth daily., Disp: 90 capsule, Rfl: 3 .  prazosin (MINIPRESS) 2 MG capsule, Take 2 mg by mouth 2 (two) times daily., Disp: , Rfl:  .  simvastatin (ZOCOR) 20 MG tablet, Take 1 tablet (20 mg total) by mouth daily., Disp: 90 tablet, Rfl: 3 .  diclofenac sodium (VOLTAREN) 1 % GEL, Apply 3 gm to 3 large joints up to 3 times a day.Dispense 3 tubes with 3 refills. (Patient not taking: Reported on 08/24/2018), Disp: 5 Tube, Rfl: 0  No Known Allergies  Objective:  BP 138/89   Pulse 85   Wt 225 lb (102.1 kg)   BMI 36.32 kg/m   VITALS: Per patient if applicable, see vitals. GENERAL: Alert, appears well and in no  acute distress. HEENT: Atraumatic, conjunctiva clear, no obvious abnormalities on inspection of external nose and ears. NECK: Normal movements of the head and neck. CARDIOPULMONARY: No increased WOB. Speaking in clear sentences. I:E ratio WNL.  MS: Moves all visible extremities without noticeable abnormality. PSYCH: Pleasant and cooperative, well-groomed. Speech normal rate and rhythm. Affect is appropriate. Insight and judgement are appropriate. Attention is focused, linear, and appropriate.  NEURO: CN grossly intact. Oriented as arrived to appointment on time with no prompting. Moves both UE equally.  SKIN: No obvious lesions, wounds, erythema, or cyanosis noted on face or hands.  Depression screen [facility] 2/9 03/17/2018 03/10/2018 02/18/2018  Decreased Interest 0 0 1  Down, Depressed, Hopeless 0 0 1  PHQ - 2 Score 0 0 2  Altered sleeping - - 1  Tired, decreased energy - - 1  Change in appetite - - 0  Feeling  bad or failure about yourself  - - 0  Trouble concentrating - - 1  Moving slowly or fidgety/restless - - 0  Suicidal thoughts - - 0  PHQ-9 Score - - 5    Assessment and Plan:   Brenda Obrien was seen today for follow-up and addendum.  Diagnoses and all orders for this visit:  Diabetes mellitus, new onset ([place]) -     Hemoglobin A1c; Future -     Comprehensive metabolic panel; Future  Essential hypertension  Dyslipidemia -     Lipid panel; Future    . COVID-19 Education: The signs and symptoms of COVID-19 were discussed with the patient and how to seek care for testing if needed. The importance of social distancing was discussed today. . Reviewed expectations re: course of current medical issues. . Discussed self-management of symptoms. . Outlined signs and symptoms indicating need for more acute intervention. . Patient verbalized understanding and all questions were answered. Marland Kitchen Health Maintenance issues including appropriate healthy diet, exercise, and smoking avoidance were  discussed with patient. . See orders for this visit as documented in the electronic medical record.  Arnette Norris, MD  Records requested if needed. Time spent: 25 minutes, of which >50% was spent in obtaining information about her symptoms, reviewing her previous labs, evaluations, and treatments, counseling her about her condition (please see the discussed topics above), and developing a plan to further investigate it; she had a number of questions which I addressed.

## 2018-08-24 NOTE — Assessment & Plan Note (Signed)
On statin. Due for labs- lab orders entered. Pt to schedule lab visit.

## 2018-08-24 NOTE — Assessment & Plan Note (Signed)
Newly diagnosed- receiving treatment- seeing Dr. Leanord [facility] neurosurgeon).  She will keep me updated.

## 2018-08-25 DIAGNOSIS — M5030 Other cervical disc degeneration, unspecified cervical region: Secondary | ICD-10-CM | POA: Diagnosis not present

## 2018-08-25 DIAGNOSIS — M6281 Muscle weakness (generalized): Secondary | ICD-10-CM | POA: Diagnosis not present

## 2018-08-25 DIAGNOSIS — M4802 Spinal stenosis, cervical region: Secondary | ICD-10-CM | POA: Diagnosis not present

## 2018-08-25 DIAGNOSIS — M542 Cervicalgia: Secondary | ICD-10-CM | POA: Diagnosis not present

## 2018-08-26 ENCOUNTER — Other Ambulatory Visit (INDEPENDENT_AMBULATORY_CARE_PROVIDER_SITE_OTHER): Payer: 59

## 2018-08-26 DIAGNOSIS — E119 Type 2 diabetes mellitus without complications: Secondary | ICD-10-CM | POA: Diagnosis not present

## 2018-08-26 DIAGNOSIS — E785 Hyperlipidemia, unspecified: Secondary | ICD-10-CM

## 2018-08-26 LAB — COMPREHENSIVE METABOLIC PANEL
ALT: 136 U/L — ABNORMAL HIGH (ref 0–35)
AST: 85 U/L — ABNORMAL HIGH (ref 0–37)
Albumin: 4.5 g/dL (ref 3.5–5.2)
Alkaline Phosphatase: 112 U/L (ref 39–117)
BUN: 10 mg/dL (ref 6–23)
CO2: 27 mEq/L (ref 19–32)
Calcium: 9.7 mg/dL (ref 8.4–10.5)
Chloride: 101 mEq/L (ref 96–112)
Creatinine, Ser: 0.7 mg/dL (ref 0.40–1.20)
GFR: 88.07 mL/min (ref >60.00)
Glucose, Bld: 124 mg/dL — ABNORMAL HIGH (ref 70–99)
Potassium: 4.9 mEq/L (ref 3.5–5.1)
Sodium: 138 mEq/L (ref 135–145)
Total Bilirubin: 0.5 mg/dL (ref 0.2–1.2)
Total Protein: 7.5 g/dL (ref 6.0–8.3)

## 2018-08-26 LAB — LIPID PANEL
Cholesterol: 235 mg/dL — ABNORMAL HIGH (ref 0–200)
HDL: 60.3 mg/dL (ref >39.00)
LDL Cholesterol: 147 mg/dL — ABNORMAL HIGH (ref 0–99)
NonHDL: 174.8
Total CHOL/HDL Ratio: 4
Triglycerides: 137 mg/dL (ref 0.0–149.0)
VLDL: 27.4 mg/dL (ref 0.0–40.0)

## 2018-08-26 LAB — HEMOGLOBIN A1C: Hgb A1c MFr Bld: 6.7 % — ABNORMAL HIGH (ref 4.6–6.5)

## 2018-08-26 NOTE — Progress Notes (Signed)
Patient and lab staff wore face masks for lab visit per COVID-19 protocol.  

## 2018-08-27 DIAGNOSIS — M5030 Other cervical disc degeneration, unspecified cervical region: Secondary | ICD-10-CM | POA: Diagnosis not present

## 2018-08-27 DIAGNOSIS — M542 Cervicalgia: Secondary | ICD-10-CM | POA: Diagnosis not present

## 2018-08-27 DIAGNOSIS — M6281 Muscle weakness (generalized): Secondary | ICD-10-CM | POA: Diagnosis not present

## 2018-08-27 DIAGNOSIS — M4802 Spinal stenosis, cervical region: Secondary | ICD-10-CM | POA: Diagnosis not present

## 2018-09-01 DIAGNOSIS — M4802 Spinal stenosis, cervical region: Secondary | ICD-10-CM | POA: Diagnosis not present

## 2018-09-01 DIAGNOSIS — M542 Cervicalgia: Secondary | ICD-10-CM | POA: Diagnosis not present

## 2018-09-01 DIAGNOSIS — M6281 Muscle weakness (generalized): Secondary | ICD-10-CM | POA: Diagnosis not present

## 2018-09-01 DIAGNOSIS — M5030 Other cervical disc degeneration, unspecified cervical region: Secondary | ICD-10-CM | POA: Diagnosis not present

## 2018-09-03 ENCOUNTER — Other Ambulatory Visit: Payer: Self-pay | Admitting: Family Medicine

## 2018-09-03 DIAGNOSIS — M6281 Muscle weakness (generalized): Secondary | ICD-10-CM | POA: Diagnosis not present

## 2018-09-03 DIAGNOSIS — M542 Cervicalgia: Secondary | ICD-10-CM | POA: Diagnosis not present

## 2018-09-03 DIAGNOSIS — M4802 Spinal stenosis, cervical region: Secondary | ICD-10-CM | POA: Diagnosis not present

## 2018-09-03 DIAGNOSIS — M5030 Other cervical disc degeneration, unspecified cervical region: Secondary | ICD-10-CM | POA: Diagnosis not present

## 2018-09-08 DIAGNOSIS — M6281 Muscle weakness (generalized): Secondary | ICD-10-CM | POA: Diagnosis not present

## 2018-09-08 DIAGNOSIS — M4802 Spinal stenosis, cervical region: Secondary | ICD-10-CM | POA: Diagnosis not present

## 2018-09-08 DIAGNOSIS — M5030 Other cervical disc degeneration, unspecified cervical region: Secondary | ICD-10-CM | POA: Diagnosis not present

## 2018-09-08 DIAGNOSIS — M542 Cervicalgia: Secondary | ICD-10-CM | POA: Diagnosis not present

## 2018-09-10 DIAGNOSIS — M5030 Other cervical disc degeneration, unspecified cervical region: Secondary | ICD-10-CM | POA: Diagnosis not present

## 2018-09-10 DIAGNOSIS — M4802 Spinal stenosis, cervical region: Secondary | ICD-10-CM | POA: Diagnosis not present

## 2018-09-10 DIAGNOSIS — M6281 Muscle weakness (generalized): Secondary | ICD-10-CM | POA: Diagnosis not present

## 2018-09-10 DIAGNOSIS — M542 Cervicalgia: Secondary | ICD-10-CM | POA: Diagnosis not present

## 2018-09-15 DIAGNOSIS — M542 Cervicalgia: Secondary | ICD-10-CM | POA: Diagnosis not present

## 2018-09-15 DIAGNOSIS — M4802 Spinal stenosis, cervical region: Secondary | ICD-10-CM | POA: Diagnosis not present

## 2018-09-15 DIAGNOSIS — M6281 Muscle weakness (generalized): Secondary | ICD-10-CM | POA: Diagnosis not present

## 2018-09-15 DIAGNOSIS — M5030 Other cervical disc degeneration, unspecified cervical region: Secondary | ICD-10-CM | POA: Diagnosis not present

## 2018-09-17 DIAGNOSIS — M6281 Muscle weakness (generalized): Secondary | ICD-10-CM | POA: Diagnosis not present

## 2018-09-17 DIAGNOSIS — M542 Cervicalgia: Secondary | ICD-10-CM | POA: Diagnosis not present

## 2018-09-17 DIAGNOSIS — M5030 Other cervical disc degeneration, unspecified cervical region: Secondary | ICD-10-CM | POA: Diagnosis not present

## 2018-09-17 DIAGNOSIS — M4802 Spinal stenosis, cervical region: Secondary | ICD-10-CM | POA: Diagnosis not present

## 2018-09-29 DIAGNOSIS — M4312 Spondylolisthesis, cervical region: Secondary | ICD-10-CM | POA: Diagnosis not present

## 2018-09-29 DIAGNOSIS — M5412 Radiculopathy, cervical region: Secondary | ICD-10-CM | POA: Diagnosis not present

## 2018-09-29 DIAGNOSIS — M50322 Other cervical disc degeneration at C5-C6 level: Secondary | ICD-10-CM | POA: Diagnosis not present

## 2018-09-29 DIAGNOSIS — M8938 Hypertrophy of bone, other site: Secondary | ICD-10-CM | POA: Diagnosis not present

## 2018-09-29 DIAGNOSIS — M5030 Other cervical disc degeneration, unspecified cervical region: Secondary | ICD-10-CM | POA: Diagnosis not present

## 2018-09-29 DIAGNOSIS — M47812 Spondylosis without myelopathy or radiculopathy, cervical region: Secondary | ICD-10-CM | POA: Diagnosis not present

## 2018-10-08 DIAGNOSIS — M4722 Other spondylosis with radiculopathy, cervical region: Secondary | ICD-10-CM | POA: Diagnosis not present

## 2018-10-14 DIAGNOSIS — Z01812 Encounter for preprocedural laboratory examination: Secondary | ICD-10-CM | POA: Diagnosis not present

## 2018-10-14 DIAGNOSIS — Z1159 Encounter for screening for other viral diseases: Secondary | ICD-10-CM | POA: Diagnosis not present

## 2018-10-14 DIAGNOSIS — M4722 Other spondylosis with radiculopathy, cervical region: Secondary | ICD-10-CM | POA: Diagnosis not present

## 2018-10-21 DIAGNOSIS — M4722 Other spondylosis with radiculopathy, cervical region: Secondary | ICD-10-CM | POA: Diagnosis not present

## 2018-10-21 HISTORY — PX: CERVICAL FUSION: SHX112

## 2018-11-03 DIAGNOSIS — F3342 Major depressive disorder, recurrent, in full remission: Secondary | ICD-10-CM | POA: Diagnosis not present

## 2018-11-03 DIAGNOSIS — F411 Generalized anxiety disorder: Secondary | ICD-10-CM | POA: Diagnosis not present

## 2018-11-11 DIAGNOSIS — F3342 Major depressive disorder, recurrent, in full remission: Secondary | ICD-10-CM | POA: Diagnosis not present

## 2018-11-11 DIAGNOSIS — F411 Generalized anxiety disorder: Secondary | ICD-10-CM | POA: Diagnosis not present

## 2018-11-16 DIAGNOSIS — F3342 Major depressive disorder, recurrent, in full remission: Secondary | ICD-10-CM | POA: Diagnosis not present

## 2018-11-16 DIAGNOSIS — F411 Generalized anxiety disorder: Secondary | ICD-10-CM | POA: Diagnosis not present

## 2018-11-20 DIAGNOSIS — F411 Generalized anxiety disorder: Secondary | ICD-10-CM | POA: Diagnosis not present

## 2018-11-20 DIAGNOSIS — F330 Major depressive disorder, recurrent, mild: Secondary | ICD-10-CM | POA: Diagnosis not present

## 2018-11-24 DIAGNOSIS — F330 Major depressive disorder, recurrent, mild: Secondary | ICD-10-CM | POA: Diagnosis not present

## 2018-11-24 DIAGNOSIS — F411 Generalized anxiety disorder: Secondary | ICD-10-CM | POA: Diagnosis not present

## 2018-11-26 DIAGNOSIS — Z981 Arthrodesis status: Secondary | ICD-10-CM | POA: Diagnosis not present

## 2018-11-26 DIAGNOSIS — M47812 Spondylosis without myelopathy or radiculopathy, cervical region: Secondary | ICD-10-CM | POA: Diagnosis not present

## 2018-11-26 DIAGNOSIS — M542 Cervicalgia: Secondary | ICD-10-CM | POA: Diagnosis not present

## 2018-11-27 DIAGNOSIS — H40013 Open angle with borderline findings, low risk, bilateral: Secondary | ICD-10-CM | POA: Diagnosis not present

## 2018-12-01 ENCOUNTER — Other Ambulatory Visit: Payer: Self-pay | Admitting: Family Medicine

## 2018-12-08 DIAGNOSIS — F411 Generalized anxiety disorder: Secondary | ICD-10-CM | POA: Diagnosis not present

## 2018-12-08 DIAGNOSIS — F330 Major depressive disorder, recurrent, mild: Secondary | ICD-10-CM | POA: Diagnosis not present

## 2018-12-15 DIAGNOSIS — F411 Generalized anxiety disorder: Secondary | ICD-10-CM | POA: Diagnosis not present

## 2018-12-15 DIAGNOSIS — F330 Major depressive disorder, recurrent, mild: Secondary | ICD-10-CM | POA: Diagnosis not present

## 2018-12-17 ENCOUNTER — Other Ambulatory Visit: Payer: Self-pay

## 2018-12-22 ENCOUNTER — Other Ambulatory Visit: Payer: Self-pay

## 2018-12-22 ENCOUNTER — Encounter: Payer: Self-pay | Admitting: Obstetrics & Gynecology

## 2018-12-22 ENCOUNTER — Ambulatory Visit (INDEPENDENT_AMBULATORY_CARE_PROVIDER_SITE_OTHER): Payer: Medicare Other | Admitting: Obstetrics & Gynecology

## 2018-12-22 VITALS — BP 144/96 | HR 88 | Temp 97.7°F | Ht 66.0 in | Wt 234.0 lb

## 2018-12-22 DIAGNOSIS — Z124 Encounter for screening for malignant neoplasm of cervix: Secondary | ICD-10-CM | POA: Diagnosis not present

## 2018-12-22 DIAGNOSIS — Z01419 Encounter for gynecological examination (general) (routine) without abnormal findings: Secondary | ICD-10-CM

## 2018-12-22 NOTE — Progress Notes (Signed)
53 y.o. G0P0000 Single White or Caucasian female here for annual exam.  Had spinal fusion C5-C6.  She is still having some pain between shoulder blades.  She is having some tingling in her index fingers and thumbs.  She is going to call Dr. Redmond Pulling about this.  Denies vaginal bleeding.    Sees Dr. Deborra Medina.  Is being seen every 4 months  HbA1C was 6.2.  Cholesterol is mildly elevated.  Liver enzymes are mildly elevated and being watched.    No LMP recorded. Patient has had a hysterectomy.          Sexually active: No.  The current method of family planning is status post hysterectomy.    Exercising: No.   Smoker:  yes  Health Maintenance: Pap:  2010 Normal  History of abnormal Pap:  no MMG: 10/24/17 BIRADS1:neg.  Can't do this right now due to recent surgery.   Colonoscopy:   10/11/17 Cologuard Neg  BMD:   Never TDaP:  2019 Pneumonia vaccine(s):  2020 Shingrix:  Discussed this today.  Declines. Hep C testing: 03/18/16 neg  Screening Labs: PCP   reports that she has been smoking cigarettes. She has been smoking about 1.00 pack per day. She has never used smokeless tobacco. She reports current alcohol use of about 14.0 - 21.0 standard drinks of alcohol per week. She reports that she does not use drugs.  Past Medical History:  Diagnosis Date  . Asthma   . Cataract   . Depression   . Diabetes mellitus, type 2 ([place]) 02/2018  . Elevated cholesterol   . Endometriosis   . Generalized anxiety disorder 2017  . GERD (gastroesophageal reflux disease)   . Glaucoma   . 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 Hypertension   . PTSD (post-traumatic stress disorder) 2017    Past Surgical History:  Procedure Laterality Date  . ANTERIOR CRUCIATE LIGAMENT REPAIR  1987,2001,2003,2005  . BACK SURGERY  01/2017   due herniated disc  . CERVICAL FUSION  10/21/2018  . EYE SURGERY Bilateral 2016   release pressure   . LAPAROSCOPIC CHOLECYSTECTOMY  5/14   [place], [place]  . LAPAROSCOPIC TOTAL HYSTERECTOMY  6/11  . left  knee surgery  09/2011  . NASAL SINUS SURGERY  1992  . REPLACEMENT TOTAL KNEE Left 9/14   Dr. Laurance Flatten  . SHOULDER SURGERY Right 09/08/2015   due to injury   . SHOULDER SURGERY Right 03/20/2016   due to injury  . SHOULDER SURGERY Left 09/10/2017   due rotator cuff    Current Outpatient Medications  Medication Sig Dispense Refill  . amitriptyline (ELAVIL) 150 MG tablet TAKE 1 TABLET BY MOUTH EVERYDAY AT BEDTIME    . Blood Glucose Monitoring Suppl (CONTOUR MONITOR) w/Device KIT 1 Device by Does not apply route daily. Patient is to test once daily. Dx E11.9 1 kit 1  . cyclobenzaprine (FLEXERIL) 5 MG tablet TAKE 1 TABLET BY MOUTH 3 TIMES DAILY AS NEEDED FOR MUSCLE SPASMS    . diclofenac sodium (VOLTAREN) 1 % GEL Apply 3 gm to 3 large joints up to 3 times a day.Dispense 3 tubes with 3 refills. 5 Tube 0  . DULoxetine (CYMBALTA) 60 MG capsule Take 120 mg by mouth daily.     Marland Kitchen gabapentin (NEURONTIN) 600 MG tablet Take 1 tablet (600 mg total) by mouth 2 (two) times daily.    Marland Kitchen glucose blood (CONTOUR TEST) test strip Patient is to use to test once daily. E11.9 100 each 12  . hydrOXYzine (  VISTARIL) 50 MG capsule Take 1 capsule by mouth daily as needed.    . Lancets MISC Patient is to test once daily. Dx. E11.9 100 each 11  . lisinopril (PRINIVIL,ZESTRIL) 10 MG tablet Take 1 tablet (10 mg total) by mouth daily. 90 tablet 3  . metFORMIN (GLUCOPHAGE) 500 MG tablet Take 1 tablet (500 mg total) by mouth 2 (two) times daily with a meal. 180 tablet 3  . nebivolol (BYSTOLIC) 5 MG tablet TAKE 1 TABLET BY MOUTH EVERY DAY (DISCONTINUE 2.5MG) 30 tablet 2  . omeprazole (PRILOSEC) 40 MG capsule Take 1 capsule (40 mg total) by mouth daily. 90 capsule 3  . prazosin (MINIPRESS) 2 MG capsule Take 2 mg by mouth 2 (two) times daily.    . simvastatin (ZOCOR) 20 MG tablet Take 1 tablet (20 mg total) by mouth daily. 90 tablet 3  . amoxicillin (AMOXIL) 500 MG capsule Take 1,000 mg by mouth as needed (For dental  appointments.).      No current facility-administered medications for this visit.     Family History  Problem Relation Age of Onset  . Coronary artery disease Mother        PAD  . Heart attack Mother 86  . Diabetes Mother   . Lung cancer Father   . Heart attack Father 74  . Hypertension Brother   . High Cholesterol Brother     Review of Systems  All other systems reviewed and are negative.   Exam:   BP (!) 144/96   Pulse 88   Temp 97.7 F (36.5 C) (Temporal)   Ht 5' 6"  (1.676 m)   Wt 234 lb (106.1 kg)   BMI 37.77 kg/m    Height: 5' 6"  (167.6 cm)  Ht Readings from Last 3 Encounters:  12/22/18 5' 6"  (1.676 m)  05/26/18 5' 6"  (1.676 m)  03/11/18 5' 6"  (1.676 m)    General appearance: alert, cooperative and appears stated age Head: Normocephalic, without obvious abnormality, atraumatic Neck: no adenopathy, supple, symmetrical, trachea midline and thyroid normal to inspection and palpation Lungs: clear to auscultation bilaterally Breasts: normal appearance, no masses or tenderness Heart: regular rate and rhythm Abdomen: soft, non-tender; bowel sounds normal; no masses,  no organomegaly Extremities: extremities normal, atraumatic, no cyanosis or edema Skin: Skin color, texture, turgor normal. No rashes or lesions Lymph nodes: Cervical, supraclavicular, and axillary nodes normal. No abnormal inguinal nodes palpated Neurologic: Grossly normal   Pelvic: External genitalia:  no lesions              Urethra:  normal appearing urethra with no masses, tenderness or lesions              Bartholins and Skenes: normal                 Vagina: normal appearing vagina with normal color and discharge, no lesions              Cervix: absent              Pap taken: No. Bimanual Exam:  Uterus:  uterus absent              Adnexa: normal adnexa and no mass, fullness, tenderness               Rectovaginal: Confirms               Anus:  normal sphincter tone, no lesions  Chaperone  was present for exam.  A:  Well  Woman with normal exam H/o robotic TLH H/o depression (and suicide attempt 11/17).  Sees Kandra Nicolas and PA Patriciaann Clan Hypertension Elevated lipids OAB H/o joint/spine issues  P:   Mammogram guidelines reviewed.  She is going to have this done when arms/shouldes are better pap smear not indicated Cologuard neg 2019 Lab work done with Deborra Medina Declines shingrix vaccination today return annually or prn

## 2018-12-23 ENCOUNTER — Encounter: Payer: Self-pay | Admitting: Obstetrics & Gynecology

## 2018-12-23 ENCOUNTER — Other Ambulatory Visit: Payer: Self-pay | Admitting: Obstetrics & Gynecology

## 2018-12-23 MED ORDER — SCOPOLAMINE 1 MG/3DAYS TD PT72: 1.0000 | MEDICATED_PATCH | TRANSDERMAL | 2 refills | Status: DC

## 2018-12-29 DIAGNOSIS — F411 Generalized anxiety disorder: Secondary | ICD-10-CM | POA: Diagnosis not present

## 2018-12-29 DIAGNOSIS — F330 Major depressive disorder, recurrent, mild: Secondary | ICD-10-CM | POA: Diagnosis not present

## 2019-01-01 ENCOUNTER — Ambulatory Visit: Payer: 59 | Admitting: Obstetrics & Gynecology

## 2019-01-05 DIAGNOSIS — F330 Major depressive disorder, recurrent, mild: Secondary | ICD-10-CM | POA: Diagnosis not present

## 2019-01-05 DIAGNOSIS — F411 Generalized anxiety disorder: Secondary | ICD-10-CM | POA: Diagnosis not present

## 2019-01-12 DIAGNOSIS — F411 Generalized anxiety disorder: Secondary | ICD-10-CM | POA: Diagnosis not present

## 2019-01-17 ENCOUNTER — Other Ambulatory Visit: Payer: Self-pay | Admitting: Family Medicine

## 2019-01-29 ENCOUNTER — Other Ambulatory Visit: Payer: Self-pay | Admitting: Family Medicine

## 2019-01-29 NOTE — Telephone Encounter (Signed)
Last fill 02/19/18 #180/3 Last OV 08/24/18 Next Ov 02/15/19

## 2019-02-04 DIAGNOSIS — M4802 Spinal stenosis, cervical region: Secondary | ICD-10-CM | POA: Diagnosis not present

## 2019-02-04 DIAGNOSIS — M542 Cervicalgia: Secondary | ICD-10-CM | POA: Diagnosis not present

## 2019-02-04 DIAGNOSIS — Z09 Encounter for follow-up examination after completed treatment for conditions other than malignant neoplasm: Secondary | ICD-10-CM | POA: Diagnosis not present

## 2019-02-04 DIAGNOSIS — Z981 Arthrodesis status: Secondary | ICD-10-CM | POA: Diagnosis not present

## 2019-02-04 DIAGNOSIS — R202 Paresthesia of skin: Secondary | ICD-10-CM | POA: Diagnosis not present

## 2019-02-04 DIAGNOSIS — M545 Low back pain: Secondary | ICD-10-CM | POA: Diagnosis not present

## 2019-02-12 NOTE — Progress Notes (Signed)
Travel or Contacts:   Any travel in the past 2 weeks? NO  Have you came in contact with anyone who has Covid?NO  Have you had a positive Covid test? If so, when?  Fever >100.75F []   Yes [x]   No []   Unknown  Chills []   Yes [x]   No []   Unknown  Muscle aches (myalgia) []   Yes [x]   No []   Unknown  Runny nose (rhinorrhea) []   Yes [x]   No []   Unknown  Sore throat []   Yes [x]   No []   Unknown Cough (new onset or worsening of chronic cough) []   Yes [x]   No []   Unknown  Shortness of breath (dyspnea) []   Yes [x]   No []   Unknown Nausea or vomiting []   Yes [x]   No []   Unknown  Headache []   Yes [x]   No []   Unknown  Abdominal pain  []   Yes [x]   No []   Unknown  Diarrhea (?3 loose/looser than normal stools/24hr period) []   Yes [x]   No []   Unknown Other, s:pecify

## 2019-02-12 NOTE — Progress Notes (Signed)
Subjective:   Patient ID: Brenda Obrien, female    DOB: July 13, 1965, 53 y.o.   MRN: [id number]  Brenda Obrien is a pleasant 53 y.o. year old female who presents to clinic today with Annual Exam (Pt is here today for a CPE. She had her flu shot. )  on 02/15/2019  HPI:  Health Maintenance  Topic Date Due  . HEMOGLOBIN A1C  02/26/2019  . FOOT EXAM  05/27/2019  . PAP SMEAR-Modifier  09/14/2019  . MAMMOGRAM  10/25/2019  . OPHTHALMOLOGY EXAM  01/13/2020  . Fecal DNA (Cologuard)  10/21/2020  . TETANUS/TDAP  02/05/2028  . INFLUENZA VACCINE  Completed  . PNEUMOCOCCAL POLYSACCHARIDE VACCINE AGE 12-64 HIGH RISK  Completed  . HIV Screening  Discontinued   Unfortunately still struggling with pain.  Has GYN- sees Dr. Edwinna Areola. Remote h/o hysterectomy.  Mammogram 8/20.  HTN-  Currently taking Bystolic 5 mg daily and lisinopril 10 mg daily. Lab Results  Component Value Date   CREATININE 0.70 08/26/2018   DM- has been controlled with Metformin 500 mg twice daily.  Does not check her FSBS regularly. Lab Results  Component Value Date   HGBA1C 6.7 (H) 08/26/2018   HLD- has been off zocor for 2 months to see if would help with her joint paiin and it didn't; Lab Results  Component Value Date   CHOL 235 (H) 08/26/2018   HDL 60.30 08/26/2018   LDLCALC 147 (H) 08/26/2018   LDLDIRECT 161.0 02/18/2018   TRIG 137.0 08/26/2018   CHOLHDL 4 08/26/2018   Lab Results  Component Value Date   ALT 136 (H) 08/26/2018   AST 85 (H) 08/26/2018   ALKPHOS 112 08/26/2018   BILITOT 0.5 08/26/2018   The 10-year ASCVD risk score [name], et al., 2013) is: 9.5%   Values used to calculate the score:     Age: 39 years     Sex: Female     Is Non-Hispanic African American: No     Diabetic: Yes     Tobacco smoker: Yes     Systolic Blood Pressure: 409 mmHg     Is BP treated: Yes     HDL Cholesterol: 60.3 mg/dL     Total Cholesterol: 235 mg/dL   Started on Trazodone for insomnia.  Has not  been able to get comfortable since her shoulder injury and surgery.  Anxiety-  Currently taking cymbalta 60 mg twice daily.  Depression screen [facility] 2/9 02/15/2019 03/17/2018 03/10/2018 02/18/2018  Decreased Interest 2 0 0 1  Down, Depressed, Hopeless 1 0 0 1  PHQ - 2 Score 3 0 0 2  Altered sleeping 1 - - 1  Tired, decreased energy 2 - - 1  Change in appetite 0 - - 0  Feeling bad or failure about yourself  1 - - 0  Trouble concentrating 2 - - 1  Moving slowly or fidgety/restless 0 - - 0  Suicidal thoughts 0 - - 0  PHQ-9 Score 9 - - 5  Difficult doing work/chores Somewhat difficult - - -   GAD 7 : Generalized Anxiety Score 02/15/2019 02/18/2018  Nervous, Anxious, on Edge 2 2  Control/stop worrying 2 2  Worry too much - different things 2 1  Trouble relaxing 2 1  Restless 0 0  Easily annoyed or irritable 1 0  Afraid - awful might happen 1 0  Total GAD 7 Score 10 6  Anxiety Difficulty Somewhat difficult -      Lab  Results  Component Value Date   CHOL 235 (H) 08/26/2018   HDL 60.30 08/26/2018   LDLCALC 147 (H) 08/26/2018   LDLDIRECT 161.0 02/18/2018   TRIG 137.0 08/26/2018   CHOLHDL 4 08/26/2018   Lab Results  Component Value Date   CREATININE 0.70 08/26/2018   Lab Results  Component Value Date   TSH 1.22 02/18/2018   Lab Results  Component Value Date   WBC 7.9 02/18/2018   HGB 15.4 (H) 02/18/2018   HCT 45.3 02/18/2018   MCV 92.1 02/18/2018   PLT 156.0 02/18/2018   Current Outpatient Medications on File Prior to Visit  Medication Sig Dispense Refill  . amoxicillin (AMOXIL) 500 MG capsule Take 1,000 mg by mouth as needed (For dental appointments.).     Marland Kitchen Blood Glucose Monitoring Suppl (CONTOUR MONITOR) w/Device KIT 1 Device by Does not apply route daily. Patient is to test once daily. Dx E11.9 1 kit 1  . cyclobenzaprine (FLEXERIL) 5 MG tablet TAKE 1 TABLET BY MOUTH 3 TIMES DAILY AS NEEDED FOR MUSCLE SPASMS    . diclofenac sodium (VOLTAREN) 1 % GEL Apply 3 gm to 3  large joints up to 3 times a day.Dispense 3 tubes with 3 refills. 5 Tube 0  . DULoxetine (CYMBALTA) 60 MG capsule Take 120 mg by mouth daily.     Marland Kitchen glucose blood (CONTOUR TEST) test strip Patient is to use to test once daily. E11.9 100 each 12  . hydrOXYzine (VISTARIL) 50 MG capsule Take 1 capsule by mouth daily as needed.    . Lancets MISC Patient is to test once daily. Dx. E11.9 100 each 11  . lisinopril (PRINIVIL,ZESTRIL) 10 MG tablet Take 1 tablet (10 mg total) by mouth daily. 90 tablet 3  . metFORMIN (GLUCOPHAGE) 500 MG tablet TAKE 1 TABLET (500 MG TOTAL) BY MOUTH 2 (TWO) TIMES DAILY WITH A MEAL. 60 tablet 0  . nebivolol (BYSTOLIC) 5 MG tablet TAKE 1 TABLET BY MOUTH EVERY DAY (DISCONTINUE 2.5MG) 30 tablet 2  . omeprazole (PRILOSEC) 40 MG capsule Take 1 capsule (40 mg total) by mouth daily. 90 capsule 3  . scopolamine (TRANSDERM-SCOP) 1 MG/3DAYS Place 1 patch (1.5 mg total) onto the skin every 3 (three) days. 4 patch 2  . simvastatin (ZOCOR) 20 MG tablet TAKE 1 TABLET BY MOUTH EVERY DAY 30 tablet 1   No current facility-administered medications on file prior to visit.     No Known Allergies  Past Medical History:  Diagnosis Date  . Asthma   . Cataract   . Depression   . Diabetes mellitus, type 2 ([place]) 02/2018  . Elevated cholesterol   . Endometriosis   . Generalized anxiety disorder 2017  . GERD (gastroesophageal reflux disease)   . Glaucoma   . 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 Hypertension   . PTSD (post-traumatic stress disorder) 2017    Past Surgical History:  Procedure Laterality Date  . ANTERIOR CRUCIATE LIGAMENT REPAIR  1987,2001,2003,2005  . BACK SURGERY  01/2017   due herniated disc  . CERVICAL FUSION  10/21/2018  . EYE SURGERY Bilateral 2016   release pressure   . LAPAROSCOPIC CHOLECYSTECTOMY  5/14   [place], [place]  . LAPAROSCOPIC TOTAL HYSTERECTOMY  6/11  . left knee surgery  09/2011  . NASAL SINUS SURGERY  1992  . REPLACEMENT TOTAL KNEE Left 9/14   Dr. Laurance Flatten   . SHOULDER SURGERY Right 09/08/2015   due to injury   . SHOULDER SURGERY Right 03/20/2016   due  to injury  . SHOULDER SURGERY Left 09/10/2017   due rotator cuff    Family History  Problem Relation Age of Onset  . Coronary artery disease Mother        PAD  . Heart attack Mother 89  . Diabetes Mother   . Lung cancer Father   . Heart attack Father 65  . Hypertension Brother   . High Cholesterol Brother     Social History   Socioeconomic History  . Marital status: Single    Spouse name: Not on file  . Number of children: Not on file  . Years of education: Not on file  . Highest education level: Not on file  Occupational History  . 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 Financial resource strain: Not on file  . Food insecurity    Worry: Not on file    Inability: Not on file  . Transportation needs    Medical: Not on file    Non-medical: Not on file  Tobacco Use  . Smoking status: Current Every Day Smoker    Packs/day: 1.00    Types: Cigarettes  . Smokeless tobacco: Never Used  Substance and Sexual Activity  . Alcohol use: Yes    Alcohol/week: 14.0 - 21.0 standard drinks    Types: 14 - 21 Cans of beer per week  . Drug use: Never  . Sexual activity: Not Currently    Birth control/protection: Surgical    Comment: hysterectomy  Lifestyle  . Physical activity    Days per week: Not on file    Minutes per session: Not on file  . Stress: Not on file  Relationships  . Social Herbalist on phone: Not on file    Gets together: Not on file    Attends religious service: Not on file    Active member of club or organization: Not on file    Attends meetings of clubs or organizations: Not on file    Relationship status: Not on file  . Intimate partner violence    Fear of current or ex partner: Not on file    Emotionally abused: Not on file    Physically abused: Not on file    Forced sexual activity: Not  on file  Other Topics Concern  . Not on file  Social History Narrative   Single      No regular exercise      Works as Pharmacist, community for [employer]         [facility], [place], Social History, Family History, Medications, and allergies have been reviewed in [facility], and have been updated if relevant.    Review of Systems  Constitutional: Negative.   HENT: Negative.   Respiratory: Negative.   Cardiovascular: Negative.   Gastrointestinal: Negative.   Endocrine: Negative.   Genitourinary: Negative.   Musculoskeletal: Negative for arthralgias.  Skin: Negative.   Allergic/Immunologic: Negative.   Neurological: Negative.   Hematological: Negative.   Psychiatric/Behavioral: Positive for sleep disturbance. Negative for dysphoric mood, self-injury and suicidal ideas. The patient is nervous/anxious.   All other systems reviewed and are negative.      Objective:    BP 122/82   Pulse 94   Temp 98.9 F (37.2 C) (Oral)   Ht _0  (1.676 m)   Wt 239 lb 6.4 oz (108.6 kg)   SpO2 97%   BMI 38.64 kg/m   Wt  Readings from Last 3 Encounters:  02/15/19 239 lb 6.4 oz (108.6 kg)  12/22/18 234 lb (106.1 kg)  08/24/18 225 lb (102.1 kg)    Physical Exam     General:  Well-developed,well-nourished,in no acute distress; alert,appropriate and cooperative throughout examination Head:  normocephalic and atraumatic.   Eyes:  vision grossly intact, pupils equal, pupils round, and pupils reactive to light.   Ears:  R ear normal and L ear normal.   Nose:  no external deformity.   Mouth:  good dentition.   Neck:  No deformities, masses, or tenderness noted. Breasts:  No mass, nodules, thickening, tenderness, bulging, retraction, inflamation, nipple discharge or skin changes noted.   Lungs:  Normal respiratory effort, chest expands symmetrically. Lungs are clear to auscultation, no crackles or wheezes. Heart:  Normal rate and regular rhythm. S1 and S2 normal without gallop, murmur,  click, rub or other extra sounds. Abdomen:  Bowel sounds positive,abdomen soft and non-tender without masses, organomegaly or hernias noted. Msk:  No deformity or scoliosis noted of thoracic or lumbar spine.   Extremities:  No clubbing, cyanosis, edema, or deformity noted with normal full range of motion of all joints.   Neurologic:  alert & oriented X3 and gait normal.   Skin:  Intact without suspicious lesions or rashes Cervical Nodes:  No lymphadenopathy noted Axillary Nodes:  No palpable lymphadenopathy Psych:  Cognition and judgment appear intact. Alert and cooperative with normal attention span and concentration. No apparent delusions, illusions, hallucinations  Assessment & Plan:   Well woman exam without gynecological exam  Episode of recurrent major depressive disorder, unspecified depression episode severity ([place])  Controlled type 2 diabetes mellitus without complication, without long-term current use of insulin ([place]) - Plan: Hemoglobin A1c, Comprehensive metabolic panel  Dyslipidemia - Plan: Lipid panel, TSH, CBC with Differential/Platelet  Essential hypertension  Risk for coronary artery disease between 10% and 20% in next 10 years No follow-ups on file.

## 2019-02-15 ENCOUNTER — Encounter: Payer: Self-pay | Admitting: Family Medicine

## 2019-02-15 ENCOUNTER — Other Ambulatory Visit: Payer: Self-pay

## 2019-02-15 ENCOUNTER — Ambulatory Visit (INDEPENDENT_AMBULATORY_CARE_PROVIDER_SITE_OTHER): Payer: Medicare Other | Admitting: Family Medicine

## 2019-02-15 VITALS — BP 122/82 | HR 94 | Temp 98.9°F | Ht 66.0 in | Wt 239.4 lb

## 2019-02-15 DIAGNOSIS — E119 Type 2 diabetes mellitus without complications: Secondary | ICD-10-CM | POA: Diagnosis not present

## 2019-02-15 DIAGNOSIS — I10 Essential (primary) hypertension: Secondary | ICD-10-CM

## 2019-02-15 DIAGNOSIS — Z0000 Encounter for general adult medical examination without abnormal findings: Secondary | ICD-10-CM | POA: Diagnosis not present

## 2019-02-15 DIAGNOSIS — G4709 Other insomnia: Secondary | ICD-10-CM | POA: Diagnosis not present

## 2019-02-15 DIAGNOSIS — E785 Hyperlipidemia, unspecified: Secondary | ICD-10-CM

## 2019-02-15 DIAGNOSIS — F339 Major depressive disorder, recurrent, unspecified: Secondary | ICD-10-CM | POA: Diagnosis not present

## 2019-02-15 DIAGNOSIS — Z9189 Other specified personal risk factors, not elsewhere classified: Secondary | ICD-10-CM

## 2019-02-15 LAB — CBC WITH DIFFERENTIAL/PLATELET
Basophils Absolute: 0.1 10*3/uL (ref 0.0–0.1)
Basophils Relative: 1 % (ref 0.0–3.0)
Eosinophils Absolute: 0.1 10*3/uL (ref 0.0–0.7)
Eosinophils Relative: 1.9 % (ref 0.0–5.0)
HCT: 47.3 % — ABNORMAL HIGH (ref 36.0–46.0)
Hemoglobin: 16.3 g/dL — ABNORMAL HIGH (ref 12.0–15.0)
Lymphocytes Relative: 28.9 % (ref 12.0–46.0)
Lymphs Abs: 2.2 10*3/uL (ref 0.7–4.0)
MCHC: 34.4 g/dL (ref 30.0–36.0)
MCV: 94.3 fl (ref 78.0–100.0)
Monocytes Absolute: 0.7 10*3/uL (ref 0.1–1.0)
Monocytes Relative: 8.5 % (ref 3.0–12.0)
Neutro Abs: 4.6 10*3/uL (ref 1.4–7.7)
Neutrophils Relative %: 59.7 % (ref 43.0–77.0)
Platelets: 223 10*3/uL (ref 150.0–400.0)
RBC: 5.02 Mil/uL (ref 3.87–5.11)
RDW: 13.7 % (ref 11.5–15.5)
WBC: 7.7 10*3/uL (ref 4.0–10.5)

## 2019-02-15 LAB — LIPID PANEL
Cholesterol: 308 mg/dL — ABNORMAL HIGH (ref 0–200)
HDL: 53.3 mg/dL (ref >39.00)
NonHDL: 254.24
Total CHOL/HDL Ratio: 6
Triglycerides: 204 mg/dL — ABNORMAL HIGH (ref 0.0–149.0)
VLDL: 40.8 mg/dL — ABNORMAL HIGH (ref 0.0–40.0)

## 2019-02-15 LAB — COMPREHENSIVE METABOLIC PANEL
ALT: 180 U/L — ABNORMAL HIGH (ref 0–35)
AST: 106 U/L — ABNORMAL HIGH (ref 0–37)
Albumin: 4.7 g/dL (ref 3.5–5.2)
Alkaline Phosphatase: 132 U/L — ABNORMAL HIGH (ref 39–117)
BUN: 11 mg/dL (ref 6–23)
CO2: 25 mEq/L (ref 19–32)
Calcium: 10 mg/dL (ref 8.4–10.5)
Chloride: 97 mEq/L (ref 96–112)
Creatinine, Ser: 0.72 mg/dL (ref 0.40–1.20)
GFR: 85.09 mL/min (ref >60.00)
Glucose, Bld: 188 mg/dL — ABNORMAL HIGH (ref 70–99)
Potassium: 4.9 mEq/L (ref 3.5–5.1)
Sodium: 133 mEq/L — ABNORMAL LOW (ref 135–145)
Total Bilirubin: 1 mg/dL (ref 0.2–1.2)
Total Protein: 7.6 g/dL (ref 6.0–8.3)

## 2019-02-15 LAB — LDL CHOLESTEROL, DIRECT: Direct LDL: 245 mg/dL

## 2019-02-15 LAB — HEMOGLOBIN A1C: Hgb A1c MFr Bld: 8.7 % — ABNORMAL HIGH (ref 4.6–6.5)

## 2019-02-15 LAB — TSH: TSH: 1.73 u[IU]/mL (ref 0.35–4.50)

## 2019-02-15 NOTE — Assessment & Plan Note (Signed)
Followed by psychiatry and gets therapy through a counselor.

## 2019-02-15 NOTE — Patient Instructions (Signed)
Great to see you. I will call you with your lab results from today and you can view them online.   

## 2019-02-15 NOTE — Assessment & Plan Note (Signed)
Well controlled on current rxs. No changes made today. 

## 2019-02-15 NOTE — Assessment & Plan Note (Signed)
Reviewed preventive care protocols, scheduled due services, and updated immunizations Discussed nutrition, exercise, diet, and healthy lifestyle.  

## 2019-02-15 NOTE — Assessment & Plan Note (Signed)
No longer taking trazodone for sleep. Flexeril helps more at bedtime since it helps with pain.

## 2019-02-15 NOTE — Assessment & Plan Note (Signed)
Advised to restart statin due to high ascvd risk score.  The 10-year ASCVD risk score Mikey Bussing DC Brooke Bonito., et al., 2013) is: 9.5%   Values used to calculate the score:     Age: 53 years     Sex: Female     Is Non-Hispanic African American: No     Diabetic: Yes     Tobacco smoker: Yes     Systolic Blood Pressure: 364 mmHg     Is BP treated: Yes     HDL Cholesterol: 60.3 mg/dL     Total Cholesterol: 235 mg/dL

## 2019-02-15 NOTE — Assessment & Plan Note (Signed)
Currently on Metformin but may need to increase dose as she admits to not eating a diabetic friendly diet lately.

## 2019-02-21 ENCOUNTER — Other Ambulatory Visit: Payer: Self-pay | Admitting: Family Medicine

## 2019-02-21 DIAGNOSIS — K219 Gastro-esophageal reflux disease without esophagitis: Secondary | ICD-10-CM

## 2019-02-21 NOTE — Progress Notes (Signed)
Virtual Visit via Video   Due to the COVID-19 pandemic, this visit was completed with telemedicine (audio/video) technology to reduce patient and provider exposure as well as to preserve personal protective equipment.   I connected with Brenda Obrien by a video enabled telemedicine application and verified that I am speaking with the correct person using two identifiers. Location patient: Home Location provider: [facility], Office Persons participating in the virtual visit: Campbell Lerner, MD   I discussed the limitations of evaluation and management by telemedicine and the availability of in person appointments. The patient expressed understanding and agreed to proceed.  Care Team   Patient Care Team: Lucille Passy, MD as PCP - General (Family Medicine) Megan Salon, MD as Consulting Physician (Gynecology)  Subjective:   HPI:  Saw her for CPX on 02/15/19- note reviewed.  Unfortunately still struggling with pain.  Discuss recent labs:  1.  Elevated liver function-  She does admit to drinking "a few beers a night," takes tylenol a few times a week.  Lab Results  Component Value Date   ALT 180 (H) 02/15/2019   AST 106 (H) 02/15/2019   ALKPHOS 132 (H) 02/15/2019   BILITOT 1.0 02/15/2019    Ref Range & Units 110moago 118yrgo  Sodium 135 - 145 mEq/L 138  131Low    Potassium 3.5 - 5.1 mEq/L 4.9  4.5   Chloride 96 - 112 mEq/L 101  95Low    CO2 19 - 32 mEq/L 27  27   Glucose, Bld 70 - 99 mg/dL 124High   375High    BUN 6 - 23 mg/dL 10  11   Creatinine, Ser 0.40 - 1.20 mg/dL 0.70  0.75   Total Bilirubin 0.2 - 1.2 mg/dL 0.5  0.5   Alkaline Phosphatase 39 - 117 U/L 112  118High    AST 0 - 37 U/L 85High   38High    ALT 0 - 35 U/L 136High   95High    Total Protein 6.0 - 8.3 g/dL 7.5  6.9   Albumin 3.5 - 5.2 g/dL 4.5  4.3   Calcium 8.4 - 10.5 mg/dL 9.7  9.7   GFR >60.00 mL/min 88.07  86.62    HLD- has been off zocor for 2 months. Lipids very high. Lab Results   Component Value Date   CHOL 308 (H) 02/15/2019   HDL 53.30 02/15/2019   LDLCALC 147 (H) 08/26/2018   LDLDIRECT 245.0 02/15/2019   TRIG 204.0 (H) 02/15/2019   CHOLHDL 6 02/15/2019   acvd risk went from 9.5% to 15.2%.   The 10-year ASCVD risk score (GMikey BussingC JrBrooke Bonito et al., 2013) is: 15.2%   Values used to calculate the score:     Age: [age]     Sex: Female     Is Non-Hispanic African American: No     Diabetic: Yes     Tobacco smoker: Yes     Systolic Blood Pressure: 12536mHg     Is BP treated: Yes     HDL Cholesterol: 53.3 mg/dL     Total Cholesterol: 308 mg/dL   DM- a1c increased from 6.7 to 8.7 over the past 6 months.  Lab Results  Component Value Date   HGBA1C 8.7 (H) 02/15/2019    Taking Metformin 500 mg twice daily.  She did admit falling off the wagon with her diabetic diet.   Current Outpatient Medications on File Prior to Visit  Medication Sig Dispense  Refill  . amoxicillin (AMOXIL) 500 MG capsule Take 1,000 mg by mouth as needed (For dental appointments.).     Marland Kitchen Blood Glucose Monitoring Suppl (CONTOUR MONITOR) w/Device KIT 1 Device by Does not apply route daily. Patient is to test once daily. Dx E11.9 1 kit 1  . cyclobenzaprine (FLEXERIL) 5 MG tablet TAKE 1 TABLET BY MOUTH 3 TIMES DAILY AS NEEDED FOR MUSCLE SPASMS    . diclofenac sodium (VOLTAREN) 1 % GEL Apply 3 gm to 3 large joints up to 3 times a day.Dispense 3 tubes with 3 refills. 5 Tube 0  . DULoxetine (CYMBALTA) 60 MG capsule Take 120 mg by mouth daily.     Marland Kitchen glucose blood (CONTOUR TEST) test strip Patient is to use to test once daily. E11.9 100 each 12  . hydrOXYzine (VISTARIL) 50 MG capsule Take 1 capsule by mouth daily as needed.    . Lancets MISC Patient is to test once daily. Dx. E11.9 100 each 11  . lisinopril (PRINIVIL,ZESTRIL) 10 MG tablet Take 1 tablet (10 mg total) by mouth daily. 90 tablet 3  . nebivolol (BYSTOLIC) 5 MG tablet TAKE 1 TABLET BY MOUTH EVERY DAY (DISCONTINUE 2.5MG) 30 tablet 2  .  omeprazole (PRILOSEC) 40 MG capsule Take 1 capsule (40 mg total) by mouth daily. 90 capsule 3  . scopolamine (TRANSDERM-SCOP) 1 MG/3DAYS Place 1 patch (1.5 mg total) onto the skin every 3 (three) days. 4 patch 2  . simvastatin (ZOCOR) 20 MG tablet TAKE 1 TABLET BY MOUTH EVERY DAY 30 tablet 1   No current facility-administered medications on file prior to visit.     No Known Allergies  Past Medical History:  Diagnosis Date  . Asthma   . Cataract   . Depression   . Diabetes mellitus, type 2 ([place]) 02/2018  . Elevated cholesterol   . Endometriosis   . Generalized anxiety disorder 2017  . GERD (gastroesophageal reflux disease)   . Glaucoma   . 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 Hypertension   . PTSD (post-traumatic stress disorder) 2017    Past Surgical History:  Procedure Laterality Date  . ANTERIOR CRUCIATE LIGAMENT REPAIR  1987,2001,2003,2005  . BACK SURGERY  01/2017   due herniated disc  . CERVICAL FUSION  10/21/2018  . EYE SURGERY Bilateral 2016   release pressure   . LAPAROSCOPIC CHOLECYSTECTOMY  5/14   [place], [place]  . LAPAROSCOPIC TOTAL HYSTERECTOMY  6/11  . left knee surgery  09/2011  . NASAL SINUS SURGERY  1992  . REPLACEMENT TOTAL KNEE Left 9/14   Dr. Laurance Flatten  . SHOULDER SURGERY Right 09/08/2015   due to injury   . SHOULDER SURGERY Right 03/20/2016   due to injury  . SHOULDER SURGERY Left 09/10/2017   due rotator cuff    Family History  Problem Relation Age of Onset  . Coronary artery disease Mother        PAD  . Heart attack Mother 75  . Diabetes Mother   . Lung cancer Father   . Heart attack Father 50  . Hypertension Brother   . High Cholesterol Brother     Social History   Socioeconomic History  . Marital status: Single    Spouse name: Not on file  . Number of children: Not on file  . Years of education: Not on file  . Highest education level: Not on file  Occupational History  . 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 Financial resource strain: Not on file  . Food insecurity    Worry: Not on file    Inability: Not on file  . Transportation needs    Medical: Not on file    Non-medical: Not on file  Tobacco Use  . Smoking status: Current Every Day Smoker    Packs/day: 1.00    Types: Cigarettes  . Smokeless tobacco: Never Used  Substance and Sexual Activity  . Alcohol use: Yes    Alcohol/week: 14.0 - 21.0 standard drinks    Types: 14 - 21 Cans of beer per week  . Drug use: Never  . Sexual activity: Not Currently    Birth control/protection: Surgical    Comment: hysterectomy  Lifestyle  . Physical activity    Days per week: Not on file    Minutes per session: Not on file  . Stress: Not on file  Relationships  . Social Herbalist on phone: Not on file    Gets together: Not on file    Attends religious service: Not on file    Active member of club or organization: Not on file    Attends meetings of clubs or organizations: Not on file    Relationship status: Not on file  . Intimate partner violence    Fear of current or ex partner: Not on file    Emotionally abused: Not on file    Physically abused: Not on file    Forced sexual activity: Not on file  Other Topics Concern  . Not on file  Social History Narrative   Single      No regular exercise      Works as Pharmacist, community for [employer]         The [place], [place], Social History, Family History, Medications, and allergies have been reviewed in [facility], and have been updated if relevant.   Review of Systems  Constitutional: Negative.   HENT: Negative.   Eyes: Negative.   Respiratory: Negative.   Cardiovascular: Negative.   Gastrointestinal: Negative.   Genitourinary: Negative.   Musculoskeletal: Negative.   Skin: Negative.   Neurological: Negative.   Endo/Heme/Allergies: Negative.   Psychiatric/Behavioral: Negative.   All other systems reviewed and are negative.    Patient  Active Problem List   Diagnosis Date Noted  . Cardiac risk counseling 02/22/2019  . Elevated liver function tests 02/22/2019  . Well woman exam without gynecological exam 02/15/2019  . Risk for coronary artery disease between 10% and 20% in next 10 years 02/15/2019  . Unspecified cervical disc disorder at C5-C6 level 08/24/2018  . Controlled type 2 diabetes mellitus without complication, without long-term current use of insulin ([place]) 03/11/2018  . Hypotension 02/04/2018  . Primary osteoarthritis of right knee 10/24/2017  . Primary osteoarthritis of both hands 10/24/2017  . Primary osteoarthritis of both hips 10/24/2017  . DDD (degenerative disc disease), lumbar 11/18/2016  . Severe episode of recurrent major depressive disorder, without psychotic features ([place]) 03/15/2016  . MDD (major depressive disorder), recurrent episode, severe ([employer]) 03/15/2016  . Insomnia 12/27/2015  . Adhesive capsulitis of right shoulder 12/21/2015  . Obesity, unspecified 09/20/2013  . Carpal tunnel syndrome 09/20/2013  . Allergic rhinitis 09/20/2013  . H/O total knee replacement 01/25/2013  . Depression 12/23/2012  . Anxiety 03/13/2012  . Hyperglycemia 02/03/2012  . Hypertension 01/21/2012  . Anterior cruciate ligament complete tear 07/01/2011  . Tachycardia 11/22/2010  . Dyslipidemia 11/22/2010  . GERD  03/27/2009    Social History   Tobacco Use  . Smoking status: Current Every Day Smoker    Packs/day: 1.00    Types: Cigarettes  . Smokeless tobacco: Never Used  Substance Use Topics  . Alcohol use: Yes    Alcohol/week: 14.0 - 21.0 standard drinks    Types: 14 - 21 Cans of beer per week    Current Outpatient Medications:  .  amoxicillin (AMOXIL) 500 MG capsule, Take 1,000 mg by mouth as needed (For dental appointments.). , Disp: , Rfl:  .  Blood Glucose Monitoring Suppl (CONTOUR MONITOR) w/Device KIT, 1 Device by Does not apply route daily. Patient is to test once daily. Dx E11.9, Disp: 1 kit, Rfl:  1 .  cyclobenzaprine (FLEXERIL) 5 MG tablet, TAKE 1 TABLET BY MOUTH 3 TIMES DAILY AS NEEDED FOR MUSCLE SPASMS, Disp: , Rfl:  .  diclofenac sodium (VOLTAREN) 1 % GEL, Apply 3 gm to 3 large joints up to 3 times a day.Dispense 3 tubes with 3 refills., Disp: 5 Tube, Rfl: 0 .  DULoxetine (CYMBALTA) 60 MG capsule, Take 120 mg by mouth daily. , Disp: , Rfl:  .  glucose blood (CONTOUR TEST) test strip, Patient is to use to test once daily. E11.9, Disp: 100 each, Rfl: 12 .  hydrOXYzine (VISTARIL) 50 MG capsule, Take 1 capsule by mouth daily as needed., Disp: , Rfl:  .  Lancets MISC, Patient is to test once daily. Dx. E11.9, Disp: 100 each, Rfl: 11 .  lisinopril (PRINIVIL,ZESTRIL) 10 MG tablet, Take 1 tablet (10 mg total) by mouth daily., Disp: 90 tablet, Rfl: 3 .  metFORMIN (GLUCOPHAGE) 500 MG tablet, 1 tab (500 mg) by mouth every morning with a meal and 2 tabs by mouth (1000 mg) every evening with a meal., Disp: 60 tablet, Rfl: 0 .  nebivolol (BYSTOLIC) 5 MG tablet, TAKE 1 TABLET BY MOUTH EVERY DAY (DISCONTINUE 2.5MG), Disp: 30 tablet, Rfl: 2 .  omeprazole (PRILOSEC) 40 MG capsule, Take 1 capsule (40 mg total) by mouth daily., Disp: 90 capsule, Rfl: 3 .  scopolamine (TRANSDERM-SCOP) 1 MG/3DAYS, Place 1 patch (1.5 mg total) onto the skin every 3 (three) days., Disp: 4 patch, Rfl: 2 .  simvastatin (ZOCOR) 20 MG tablet, TAKE 1 TABLET BY MOUTH EVERY DAY, Disp: 30 tablet, Rfl: 1  No Known Allergies  Objective:  There were no vitals taken for this visit.  VITALS: Per patient if applicable, see vitals. GENERAL: Alert, appears well and in no acute distress. HEENT: Atraumatic, conjunctiva clear, no obvious abnormalities on inspection of external nose and ears. NECK: Normal movements of the head and neck. CARDIOPULMONARY: No increased WOB. Speaking in clear sentences. I:E ratio WNL.  MS: Moves all visible extremities without noticeable abnormality. PSYCH: Pleasant and cooperative, well-groomed. Speech normal  rate and rhythm. Affect is appropriate. Insight and judgement are appropriate. Attention is focused, linear, and appropriate.  NEURO: CN grossly intact. Oriented as arrived to appointment on time with no prompting. Moves both UE equally.  SKIN: No obvious lesions, wounds, erythema, or cyanosis noted on face or hands.  Depression screen [facility] 2/9 02/15/2019 03/17/2018 03/10/2018  Decreased Interest 2 0 0  Down, Depressed, Hopeless 1 0 0  PHQ - 2 Score 3 0 0  Altered sleeping 1 - -  Tired, decreased energy 2 - -  Change in appetite 0 - -  Feeling bad or failure about yourself  1 - -  Trouble concentrating 2 - -  Moving slowly or fidgety/restless  0 - -  Suicidal thoughts 0 - -  PHQ-9 Score 9 - -  Difficult doing work/chores Somewhat difficult - -    Assessment and Plan:   Brenda Obrien was seen today for follow-up.  Diagnoses and all orders for this visit:  Controlled type 2 diabetes mellitus without complication, without long-term current use of insulin (HCC) -     Comprehensive metabolic panel; Future -     DM- a1c; Future  Cardiac risk counseling -     Comprehensive metabolic panel; Future  Elevated liver function tests -     Comprehensive metabolic panel; Future  Dyslipidemia -     Comprehensive metabolic panel; Future -     Lipid panel; Future  Other orders -     metFORMIN (GLUCOPHAGE) 500 MG tablet; 1 tab (500 mg) by mouth every morning with a meal and 2 tabs by mouth (1000 mg) every evening with a meal.    . COVID-19 Education: The signs and symptoms of COVID-19 were discussed with the patient and how to seek care for testing if needed. The importance of social distancing was discussed today. . Reviewed expectations re: course of current medical issues. . Discussed self-management of symptoms. . Outlined signs and symptoms indicating need for more acute intervention. . Patient verbalized understanding and all questions were answered. Marland Kitchen Health Maintenance issues including  appropriate healthy diet, exercise, and smoking avoidance were discussed with patient. . See orders for this visit as documented in the electronic medical record.  Arnette Norris, MD  Records requested if needed. Time spent: 25 minutes, of which >50% was spent in obtaining information about her symptoms, reviewing her previous labs, evaluations, and treatments, counseling her about her condition (please see the discussed topics above), and developing a plan to further investigate it; she had a number of questions which I addressed.

## 2019-02-22 ENCOUNTER — Encounter: Payer: Self-pay | Admitting: Family Medicine

## 2019-02-22 ENCOUNTER — Other Ambulatory Visit: Payer: Self-pay

## 2019-02-22 ENCOUNTER — Ambulatory Visit (INDEPENDENT_AMBULATORY_CARE_PROVIDER_SITE_OTHER): Payer: Medicare Other | Admitting: Family Medicine

## 2019-02-22 DIAGNOSIS — R7989 Other specified abnormal findings of blood chemistry: Secondary | ICD-10-CM

## 2019-02-22 DIAGNOSIS — Z7189 Other specified counseling: Secondary | ICD-10-CM

## 2019-02-22 DIAGNOSIS — E785 Hyperlipidemia, unspecified: Secondary | ICD-10-CM

## 2019-02-22 DIAGNOSIS — E119 Type 2 diabetes mellitus without complications: Secondary | ICD-10-CM

## 2019-02-22 MED ORDER — METFORMIN HCL 500 MG PO TABS: ORAL_TABLET | ORAL | 0 refills | Status: DC

## 2019-02-22 NOTE — Assessment & Plan Note (Signed)
Discussed importance of bringing down her cholesterol and her other risk factors. The patient indicates understanding of these issues and agrees with the plan.

## 2019-02-22 NOTE — Telephone Encounter (Signed)
Last fill 02/04/18  #90/3 Last OV 02/22/19

## 2019-02-22 NOTE — Assessment & Plan Note (Signed)
>  25 minutes spent in face to face time with patient, >50% spent in counselling or coordination of care discussing elevated liver function.  Likely multifactorial- fatty liver, alcohol use, tylenol use.  Discussed importance of cutting back on drinking to maybe a beer every other day, then a beer once a week.  Do not take tylenol for the next few weeks. Will repeat liver function in 3 weeks. The patient indicates understanding of these issues and agrees with the plan.

## 2019-02-22 NOTE — Assessment & Plan Note (Signed)
  Deteriorated. She just restarted her diabetic friendly diet.  Increase metforimin to 500 mg with breakfast and 1000 mg with supper. New eRx sent. Labs ordered and lab visit scheduled in 3 months. The patient indicates understanding of these issues and agrees with the plan.

## 2019-02-22 NOTE — Assessment & Plan Note (Signed)
Deteriorated. She has restarted zocor.

## 2019-02-22 NOTE — Progress Notes (Signed)
Pt is on the schedule for a virtual visit today.

## 2019-02-27 DIAGNOSIS — Z20828 Contact with and (suspected) exposure to other viral communicable diseases: Secondary | ICD-10-CM | POA: Diagnosis not present

## 2019-02-27 DIAGNOSIS — R05 Cough: Secondary | ICD-10-CM | POA: Diagnosis not present

## 2019-02-27 DIAGNOSIS — J329 Chronic sinusitis, unspecified: Secondary | ICD-10-CM | POA: Diagnosis not present

## 2019-03-03 DIAGNOSIS — F411 Generalized anxiety disorder: Secondary | ICD-10-CM | POA: Diagnosis not present

## 2019-03-03 DIAGNOSIS — F331 Major depressive disorder, recurrent, moderate: Secondary | ICD-10-CM | POA: Diagnosis not present

## 2019-03-08 DIAGNOSIS — F331 Major depressive disorder, recurrent, moderate: Secondary | ICD-10-CM | POA: Diagnosis not present

## 2019-03-08 DIAGNOSIS — F411 Generalized anxiety disorder: Secondary | ICD-10-CM | POA: Diagnosis not present

## 2019-03-15 DIAGNOSIS — F331 Major depressive disorder, recurrent, moderate: Secondary | ICD-10-CM | POA: Diagnosis not present

## 2019-03-15 DIAGNOSIS — F411 Generalized anxiety disorder: Secondary | ICD-10-CM | POA: Diagnosis not present

## 2019-03-23 DIAGNOSIS — F331 Major depressive disorder, recurrent, moderate: Secondary | ICD-10-CM | POA: Diagnosis not present

## 2019-03-23 DIAGNOSIS — F411 Generalized anxiety disorder: Secondary | ICD-10-CM | POA: Diagnosis not present

## 2019-03-24 ENCOUNTER — Other Ambulatory Visit: Payer: Self-pay | Admitting: Family Medicine

## 2019-03-24 NOTE — Telephone Encounter (Signed)
Last OV 02/15/19 Last fill 02/22/19 #60/0

## 2019-03-30 DIAGNOSIS — F331 Major depressive disorder, recurrent, moderate: Secondary | ICD-10-CM | POA: Diagnosis not present

## 2019-03-30 DIAGNOSIS — F411 Generalized anxiety disorder: Secondary | ICD-10-CM | POA: Diagnosis not present

## 2019-04-01 ENCOUNTER — Other Ambulatory Visit: Payer: Self-pay

## 2019-04-01 MED ORDER — SIMVASTATIN 20 MG PO TABS: 20.0000 mg | ORAL_TABLET | Freq: Every day | ORAL | 1 refills | Status: DC

## 2019-04-01 NOTE — Telephone Encounter (Signed)
Last OV 02/22/19 Last fill 01/18/19  #30/1

## 2019-04-02 ENCOUNTER — Other Ambulatory Visit: Payer: Self-pay | Admitting: Family Medicine

## 2019-04-06 DIAGNOSIS — F331 Major depressive disorder, recurrent, moderate: Secondary | ICD-10-CM | POA: Diagnosis not present

## 2019-04-06 DIAGNOSIS — F411 Generalized anxiety disorder: Secondary | ICD-10-CM | POA: Diagnosis not present

## 2019-04-07 ENCOUNTER — Other Ambulatory Visit: Payer: Self-pay

## 2019-04-07 MED ORDER — NEBIVOLOL HCL 5 MG PO TABS: ORAL_TABLET | ORAL | 2 refills | Status: DC

## 2019-04-07 NOTE — Telephone Encounter (Signed)
Last OV 02/22/19 Last fill 12/01/18  #30/2

## 2019-04-13 ENCOUNTER — Other Ambulatory Visit: Payer: Self-pay | Admitting: Family Medicine

## 2019-04-13 ENCOUNTER — Other Ambulatory Visit: Payer: Self-pay

## 2019-04-26 ENCOUNTER — Other Ambulatory Visit: Payer: Medicare Other

## 2019-05-03 ENCOUNTER — Other Ambulatory Visit: Payer: Self-pay | Admitting: Family Medicine

## 2019-05-10 DIAGNOSIS — M4722 Other spondylosis with radiculopathy, cervical region: Secondary | ICD-10-CM | POA: Diagnosis not present

## 2019-05-10 DIAGNOSIS — M960 Pseudarthrosis after fusion or arthrodesis: Secondary | ICD-10-CM | POA: Diagnosis not present

## 2019-05-10 DIAGNOSIS — Z9889 Other specified postprocedural states: Secondary | ICD-10-CM | POA: Diagnosis not present

## 2019-05-10 DIAGNOSIS — Z981 Arthrodesis status: Secondary | ICD-10-CM | POA: Diagnosis not present

## 2019-05-20 ENCOUNTER — Other Ambulatory Visit: Payer: Self-pay

## 2019-05-20 MED ORDER — LISINOPRIL 10 MG PO TABS: 10.0000 mg | ORAL_TABLET | Freq: Every day | ORAL | 3 refills | Status: DC

## 2019-05-24 ENCOUNTER — Other Ambulatory Visit: Payer: Self-pay | Admitting: Family Medicine

## 2019-05-28 ENCOUNTER — Other Ambulatory Visit: Payer: Self-pay

## 2019-05-28 DIAGNOSIS — H40013 Open angle with borderline findings, low risk, bilateral: Secondary | ICD-10-CM | POA: Diagnosis not present

## 2019-05-28 DIAGNOSIS — E119 Type 2 diabetes mellitus without complications: Secondary | ICD-10-CM

## 2019-05-28 DIAGNOSIS — E785 Hyperlipidemia, unspecified: Secondary | ICD-10-CM

## 2019-05-28 NOTE — Addendum Note (Signed)
Addended by: Lerry Liner on: 05/28/2019 11:35 AM   Modules accepted: Orders

## 2019-05-30 ENCOUNTER — Other Ambulatory Visit: Payer: Self-pay | Admitting: Family Medicine

## 2019-06-01 NOTE — Telephone Encounter (Signed)
Ok to refill 

## 2019-06-01 NOTE — Telephone Encounter (Signed)
Last OV 02/22/19 Last fill 04/01/19  #30/1

## 2019-06-07 NOTE — Telephone Encounter (Signed)
I spoke with pt and she has a lab appointment in April.  Pt said she will think about continuing care here.

## 2019-06-22 DIAGNOSIS — H40013 Open angle with borderline findings, low risk, bilateral: Secondary | ICD-10-CM | POA: Diagnosis not present

## 2019-06-30 ENCOUNTER — Other Ambulatory Visit: Payer: Self-pay

## 2019-06-30 ENCOUNTER — Other Ambulatory Visit: Payer: Self-pay | Admitting: Family Medicine

## 2019-06-30 DIAGNOSIS — H40013 Open angle with borderline findings, low risk, bilateral: Secondary | ICD-10-CM | POA: Diagnosis not present

## 2019-06-30 MED ORDER — NEBIVOLOL HCL 5 MG PO TABS: ORAL_TABLET | ORAL | 2 refills | Status: DC

## 2019-07-10 DIAGNOSIS — Z23 Encounter for immunization: Secondary | ICD-10-CM | POA: Diagnosis not present

## 2019-07-15 ENCOUNTER — Ambulatory Visit: Payer: 59 | Admitting: Obstetrics & Gynecology

## 2019-07-26 ENCOUNTER — Other Ambulatory Visit: Payer: Medicare Other

## 2019-07-26 ENCOUNTER — Other Ambulatory Visit: Payer: Self-pay | Admitting: Family Medicine

## 2019-07-28 DIAGNOSIS — H40013 Open angle with borderline findings, low risk, bilateral: Secondary | ICD-10-CM | POA: Diagnosis not present

## 2019-07-31 DIAGNOSIS — Z23 Encounter for immunization: Secondary | ICD-10-CM | POA: Diagnosis not present

## 2019-08-26 DIAGNOSIS — J069 Acute upper respiratory infection, unspecified: Secondary | ICD-10-CM | POA: Diagnosis not present

## 2019-08-26 DIAGNOSIS — Z20822 Contact with and (suspected) exposure to covid-19: Secondary | ICD-10-CM | POA: Diagnosis not present

## 2019-08-31 ENCOUNTER — Telehealth (INDEPENDENT_AMBULATORY_CARE_PROVIDER_SITE_OTHER): Payer: Medicare Other | Admitting: Family Medicine

## 2019-08-31 ENCOUNTER — Encounter: Payer: Self-pay | Admitting: Family Medicine

## 2019-08-31 VITALS — Wt 240.0 lb

## 2019-08-31 DIAGNOSIS — R0981 Nasal congestion: Secondary | ICD-10-CM

## 2019-08-31 DIAGNOSIS — R05 Cough: Secondary | ICD-10-CM

## 2019-08-31 DIAGNOSIS — R059 Cough, unspecified: Secondary | ICD-10-CM

## 2019-08-31 MED ORDER — DOXYCYCLINE HYCLATE 100 MG PO TABS: 100.0000 mg | ORAL_TABLET | Freq: Two times a day (BID) | ORAL | 0 refills | Status: DC

## 2019-08-31 NOTE — Patient Instructions (Signed)
INSTRUCTIONS FOR UPPER RESPIRATORY INFECTION:  -plenty of rest and fluids  -nasal saline wash 2-3 times daily (use prepackaged nasal saline or bottled/distilled water if making your own)   -can use AFRIN nasal spray for drainage and nasal congestion - but do NOT use longer then 3-4 days  -can use tylenol (in no history of liver disease) or ibuprofen (if no history of kidney disease, bowel bleeding or significant heart disease) as directed for aches and sorethroat  -if you are taking a cough medication - use only as directed, may also try a teaspoon of honey to coat the throat and throat lozenges.  -for sore throat, salt water gargles can help   -I sent the medication(s) we discussed to your pharmacy: Meds ordered this encounter  Medications  . doxycycline (VIBRA-TABS) 100 MG tablet    Sig: Take 1 tablet (100 mg total) by mouth 2 (two) times daily.    Dispense:  14 tablet    Refill:  0    Please let us know if you have any questions or concerns regarding this prescription.  I hope you are feeling better soon! Seek care promptly if your symptoms worsen, new concerns arise or you are not improving with treatment.  Upper Respiratory Infection, Adult An upper respiratory infection (URI) is also known as the common cold. It is often caused by a type of germ (virus). Colds are easily spread (contagious). You can pass it to others by kissing, coughing, sneezing, or drinking out of the same glass. Usually, you get better in 1 to 3  weeks.  However, the cough can last for even longer. HOME CARE   Only take medicine as told by your doctor. Follow instructions provided above.  Drink enough water and fluids to keep your pee (urine) clear or pale yellow.  Get plenty of rest.  Return to work when your temperature is < 100 for 24 hours or as told by your doctor. You may use a face mask and wash your hands to stop your cold from spreading. GET HELP RIGHT AWAY IF:   After the first few days,  you feel you are getting worse.  You have questions about your medicine.  You have chills, shortness of breath, or red spit (mucus).  You have pain in the face for more then 1-2 days, especially when you bend forward.  You have a fever, puffy (swollen) neck, pain when you swallow, or white spots in the back of your throat.  You have a bad headache, ear pain, sinus pain, or chest pain.  You have a high-pitched whistling sound when you breathe in and out (wheezing).  You cough up blood.  You have sore muscles or a stiff neck. MAKE SURE YOU:   Understand these instructions.  Will watch your condition.  Will get help right away if you are not doing well or get worse. Document Released: 09/18/2007 Document Revised: 06/24/2011 Document Reviewed: 07/07/2013 [facility] Patient Information 2015 [place], [place]. This information is not intended to replace advice given to you by your health care provider. Make sure you discuss any questions you have with your health care provider.

## 2019-08-31 NOTE — Progress Notes (Signed)
Virtual Visit via Video Note  I connected with Brenda Obrien  on 08/31/19 at 10:20 AM EDT by a video enabled telemedicine application and verified that I am speaking with the correct person using two identifiers.  Location patient: home, [place] Location provider:work or home office Persons participating in the virtual visit: patient, provider  I discussed the limitations of evaluation and management by telemedicine and the availability of in person appointments. The patient expressed understanding and agreed to proceed.   HPI:  Acute visit for Sinus issues: -started about 10 days ago -symptoms include: fever initially (now resolved), sinus congestion - thick and discolored, PND, body aches initially - now resolved, cough, laryngitis, mild SOB when active and outside - just a bit winded, now some sinus pain and pressure too -went to [facility] last week and was given a Zpack and tessalon perles -denies fever in last 4 days, SOB, CP, NVD -had a COVID test both rapid and saliva test at [facility] 4 days into the ilness -temp 97.7 today -thinks picked this up from her niece whom was sick with similar symptoms  ROS: See pertinent positives and negatives per HPI.  Past Medical History:  Diagnosis Date  . Asthma   . Cataract   . Depression   . Diabetes mellitus, type 2 ([place]) 02/2018  . Elevated cholesterol   . Endometriosis   . Generalized anxiety disorder 2017  . GERD (gastroesophageal reflux disease)   . Glaucoma   . 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 Hypertension   . PTSD (post-traumatic stress disorder) 2017    Past Surgical History:  Procedure Laterality Date  . ANTERIOR CRUCIATE LIGAMENT REPAIR  1987,2001,2003,2005  . BACK SURGERY  01/2017   due herniated disc  . CERVICAL FUSION  10/21/2018  . EYE SURGERY Bilateral 2016   release pressure   . LAPAROSCOPIC CHOLECYSTECTOMY  5/14   [place], [place]  . LAPAROSCOPIC TOTAL HYSTERECTOMY  6/11  . left knee surgery  09/2011  . NASAL SINUS SURGERY  1992   . REPLACEMENT TOTAL KNEE Left 9/14   Dr. Laurance Flatten  . SHOULDER SURGERY Right 09/08/2015   due to injury   . SHOULDER SURGERY Right 03/20/2016   due to injury  . SHOULDER SURGERY Left 09/10/2017   due rotator cuff    Family History  Problem Relation Age of Onset  . Coronary artery disease Mother        PAD  . Heart attack Mother 28  . Diabetes Mother   . Lung cancer Father   . Heart attack Father 62  . Hypertension Brother   . High Cholesterol Brother     SOCIAL HX: see hpi   Current Outpatient Medications:  .  Blood Glucose Monitoring Suppl (CONTOUR MONITOR) w/Device KIT, 1 Device by Does not apply route daily. Patient is to test once daily. Dx E11.9, Disp: 1 kit, Rfl: 1 .  cyclobenzaprine (FLEXERIL) 5 MG tablet, TAKE 1 TABLET BY MOUTH 3 TIMES DAILY AS NEEDED FOR MUSCLE SPASMS, Disp: , Rfl:  .  diclofenac sodium (VOLTAREN) 1 % GEL, Apply 3 gm to 3 large joints up to 3 times a day.Dispense 3 tubes with 3 refills., Disp: 5 Tube, Rfl: 0 .  DULoxetine (CYMBALTA) 60 MG capsule, Take 120 mg by mouth daily. , Disp: , Rfl:  .  glucose blood (CONTOUR NEXT TEST) test strip, Use to check sugar daily Dx E11.9, Disp: 50 strip, Rfl: 3 .  hydrOXYzine (VISTARIL) 50 MG capsule, Take 1 capsule by mouth daily  as needed., Disp: , Rfl:  .  lisinopril (ZESTRIL) 10 MG tablet, Take 1 tablet (10 mg total) by mouth daily., Disp: 90 tablet, Rfl: 3 .  metFORMIN (GLUCOPHAGE) 500 MG tablet, TAKE 1 TABLET(500MG) BY MOUTH EVERY MORNING WITH A MEAL AND 2 TABS(1000MG) EVERY EVENING WITH MEAL., Disp: 180 tablet, Rfl: 3 .  Microlet Lancets MISC, USE AS DIRECTED EVERY DAY, Disp: 100 each, Rfl: 11 .  nebivolol (BYSTOLIC) 5 MG tablet, TAKE 1 TABLET BY MOUTH EVERY DAY (DISCONTINUE 2.5MG), Disp: 30 tablet, Rfl: 2 .  omeprazole (PRILOSEC) 40 MG capsule, TAKE 1 CAPSULE BY MOUTH EVERY DAY, Disp: 30 capsule, Rfl: 11 .  scopolamine (TRANSDERM-SCOP) 1 MG/3DAYS, Place 1 patch (1.5 mg total) onto the skin every 3 (three)  days., Disp: 4 patch, Rfl: 2 .  simvastatin (ZOCOR) 20 MG tablet, TAKE 1 TABLET BY MOUTH EVERY DAY, Disp: 30 tablet, Rfl: 0 .  doxycycline (VIBRA-TABS) 100 MG tablet, Take 1 tablet (100 mg total) by mouth 2 (two) times daily., Disp: 14 tablet, Rfl: 0  EXAM:  VITALS per patient if applicable:  GENERAL: alert, oriented, appears well and in no acute distress  HEENT: atraumatic, conjunttiva clear, no obvious abnormalities on inspection of external nose and ears  NECK: normal movements of the head and neck  LUNGS: on inspection no signs of respiratory distress, breathing rate appears normal, no obvious gross SOB, gasping or wheezing  CV: no obvious cyanosis  MS: moves all visible extremities without noticeable abnormality  PSYCH/NEURO: pleasant and cooperative, no obvious depression or anxiety, speech and thought processing grossly intact  ASSESSMENT AND PLAN:  Discussed the following assessment and plan:  Sinus congestion  Cough  -we discussed possible serious and likely etiologies, options for evaluation and workup, limitations of telemedicine visit vs in person visit, treatment, treatment risks and precautions. Pt prefers to treat via telemedicine empirically rather then risking or undertaking an in person visit at this moment. Query developing partially treated sinusitis 2ndary from viral URI or influenza vs other. Seems like things worsened when zpack finished. She has opted for empiric treatment with nasal saline and nasal decongestant, but if any worsening or not improving Doxy 125m bid x 7 days. Discussed different options for cough and she is going to try musinex. May require prednisone if ongoing symptoms for possible bronchial involvement. She has follow up in 2 days and advised she notify the ofice of her symptoms - however she would be > 10 days out from onset and had 2 negative covid tests.  Patient agrees to seek prompt in person care if worsening, new symptoms arise, or if  is not improving with treatment.   I discussed the assessment and treatment plan with the patient. The patient was provided an opportunity to ask questions and all were answered. The patient agreed with the plan and demonstrated an understanding of the instructions.   The patient was advised to call back or seek an in-person evaluation if the symptoms worsen or if the condition fails to improve as anticipated.   HLucretia Kern DO

## 2019-09-02 ENCOUNTER — Encounter: Payer: Self-pay | Admitting: Internal Medicine

## 2019-09-02 ENCOUNTER — Telehealth (INDEPENDENT_AMBULATORY_CARE_PROVIDER_SITE_OTHER): Payer: Medicare Other | Admitting: Internal Medicine

## 2019-09-02 VITALS — Temp 98.0°F | Ht 66.0 in | Wt 240.0 lb

## 2019-09-02 DIAGNOSIS — R7401 Elevation of levels of liver transaminase levels: Secondary | ICD-10-CM | POA: Diagnosis not present

## 2019-09-02 DIAGNOSIS — I10 Essential (primary) hypertension: Secondary | ICD-10-CM | POA: Diagnosis not present

## 2019-09-02 DIAGNOSIS — E119 Type 2 diabetes mellitus without complications: Secondary | ICD-10-CM | POA: Diagnosis not present

## 2019-09-02 DIAGNOSIS — K219 Gastro-esophageal reflux disease without esophagitis: Secondary | ICD-10-CM | POA: Diagnosis not present

## 2019-09-02 DIAGNOSIS — E785 Hyperlipidemia, unspecified: Secondary | ICD-10-CM

## 2019-09-02 DIAGNOSIS — J069 Acute upper respiratory infection, unspecified: Secondary | ICD-10-CM | POA: Diagnosis not present

## 2019-09-02 NOTE — Progress Notes (Signed)
  Virtual Visit via Video Note  I connected with Brenda Obrien on 09/02/19 at 11:00 AM EDT by a video enabled telemedicine application and verified that I am speaking with the correct person using two identifiers.  Location patient: home Location provider: work office Persons participating in the virtual visit: patient, provider  I discussed the limitations of evaluation and management by telemedicine and the availability of in person appointments. The patient expressed understanding and agreed to proceed.   HPI:  Brenda Obrien is a 53 y.o. female who is coming in today for the above mentioned reasons.  She is here today for the main purpose of transferring care.  Previous PCP used to be Dr. Talia Aron.  Her past medical history significant for morbid obesity, type 2 diabetes that has most recently been uncontrolled with an A1c of 8.7 in November 2020, hyperlipidemia with an LDL way above goal at 245, history of hypertension that has been well controlled in the past, transaminitis.  She has recently been dealing with an upper respiratory infection.  She saw another provider in this practice 2 days ago and was given a prescription for doxycycline.  She states she feels improved but is still coughing and has a hoarse voice.  No recent fevers.    ROS: Constitutional: Denies fever, chills, diaphoresis, appetite change. HEENT: Denies photophobia, eye pain, redness, hearing loss, ear pain,  mouth sores, trouble swallowing, neck pain, neck stiffness and tinnitus.   Respiratory: Denies SOB, DOE, chest tightness,  and wheezing.   Cardiovascular: Denies chest pain, palpitations and leg swelling.  Gastrointestinal: Denies nausea, vomiting, abdominal pain, diarrhea, constipation, blood in stool and abdominal distention.  Genitourinary: Denies dysuria, urgency, frequency, hematuria, flank pain and difficulty urinating.  Endocrine: Denies: hot or cold intolerance, sweats, changes in hair or nails,  polyuria, polydipsia. Musculoskeletal: Denies myalgias, back pain, joint swelling, arthralgias and gait problem.  Skin: Denies pallor, rash and wound.  Neurological: Denies dizziness, seizures, syncope, weakness, light-headedness, numbness and headaches.  Hematological: Denies adenopathy. Easy bruising, personal or family bleeding history  Psychiatric/Behavioral: Denies suicidal ideation, mood changes, confusion, nervousness, sleep disturbance and agitation   Past Medical History:  Diagnosis Date  . Asthma   . Cataract   . Depression   . Diabetes mellitus, type 2 ([facility]) 02/2018  . Elevated cholesterol   . Endometriosis   . Generalized anxiety disorder 2017  . GERD (gastroesophageal reflux disease)   . Glaucoma   . 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 Hypertension   . PTSD (post-traumatic stress disorder) 2017    Past Surgical History:  Procedure Laterality Date  . ANTERIOR CRUCIATE LIGAMENT REPAIR  1987,2001,2003,2005  . BACK SURGERY  01/2017   due herniated disc  . CERVICAL FUSION  10/21/2018  . EYE SURGERY Bilateral 2016   release pressure   . LAPAROSCOPIC CHOLECYSTECTOMY  5/14   [place], [place]  . LAPAROSCOPIC TOTAL HYSTERECTOMY  6/11  . left knee surgery  09/2011  . NASAL SINUS SURGERY  1992  . REPLACEMENT TOTAL KNEE Left 9/14   Dr. David Martin  . SHOULDER SURGERY Right 09/08/2015   due to injury   . SHOULDER SURGERY Right 03/20/2016   due to injury  . SHOULDER SURGERY Left 09/10/2017   due rotator cuff    Family History  Problem Relation Age of Onset  . Coronary artery disease Mother        PAD  . Heart attack Mother 50  . Diabetes Mother   . Lung   cancer Father   . Heart attack Father 30  . Hypertension Brother   . High Cholesterol Brother     SOCIAL HX:   reports that she has been smoking cigarettes. She has been smoking about 1.00 pack per day. She has never used smokeless tobacco. She reports current alcohol use of about 14.0 - 21.0 standard drinks of alcohol per  week. She reports that she does not use drugs.   Current Outpatient Medications:  .  Blood Glucose Monitoring Suppl (CONTOUR MONITOR) w/Device KIT, 1 Device by Does not apply route daily. Patient is to test once daily. Dx E11.9, Disp: 1 kit, Rfl: 1 .  cyclobenzaprine (FLEXERIL) 5 MG tablet, TAKE 1 TABLET BY MOUTH 3 TIMES DAILY AS NEEDED FOR MUSCLE SPASMS, Disp: , Rfl:  .  diclofenac sodium (VOLTAREN) 1 % GEL, Apply 3 gm to 3 large joints up to 3 times a day.Dispense 3 tubes with 3 refills., Disp: 5 Tube, Rfl: 0 .  doxycycline (VIBRA-TABS) 100 MG tablet, Take 1 tablet (100 mg total) by mouth 2 (two) times daily., Disp: 14 tablet, Rfl: 0 .  DULoxetine (CYMBALTA) 60 MG capsule, Take 120 mg by mouth daily. , Disp: , Rfl:  .  glucose blood (CONTOUR NEXT TEST) test strip, Use to check sugar daily Dx E11.9, Disp: 50 strip, Rfl: 3 .  hydrOXYzine (VISTARIL) 50 MG capsule, Take 1 capsule by mouth daily as needed., Disp: , Rfl:  .  lisinopril (ZESTRIL) 10 MG tablet, Take 1 tablet (10 mg total) by mouth daily., Disp: 90 tablet, Rfl: 3 .  metFORMIN (GLUCOPHAGE) 500 MG tablet, TAKE 1 TABLET(500MG) BY MOUTH EVERY MORNING WITH A MEAL AND 2 TABS(1000MG) EVERY EVENING WITH MEAL., Disp: 180 tablet, Rfl: 3 .  Microlet Lancets MISC, USE AS DIRECTED EVERY DAY, Disp: 100 each, Rfl: 11 .  nebivolol (BYSTOLIC) 5 MG tablet, TAKE 1 TABLET BY MOUTH EVERY DAY (DISCONTINUE 2.5MG), Disp: 30 tablet, Rfl: 2 .  omeprazole (PRILOSEC) 40 MG capsule, TAKE 1 CAPSULE BY MOUTH EVERY DAY, Disp: 30 capsule, Rfl: 11 .  scopolamine (TRANSDERM-SCOP) 1 MG/3DAYS, Place 1 patch (1.5 mg total) onto the skin every 3 (three) days., Disp: 4 patch, Rfl: 2 .  simvastatin (ZOCOR) 20 MG tablet, TAKE 1 TABLET BY MOUTH EVERY DAY, Disp: 30 tablet, Rfl: 0  EXAM:   VITALS per patient if applicable: None reported  GENERAL: alert, oriented, significant coughing.  HEENT: atraumatic, conjunttiva clear, no obvious abnormalities on inspection of external  nose and ears  NECK: normal movements of the head and neck  LUNGS: on inspection no signs of respiratory distress, breathing rate appears normal, no obvious gross increased work of breathing, gasping or wheezing  CV: no obvious cyanosis  MS: moves all visible extremities without noticeable abnormality  PSYCH/NEURO: pleasant and cooperative, no obvious depression or anxiety, speech and thought processing grossly intact  ASSESSMENT AND PLAN:   Gastroesophageal reflux disease, unspecified whether esophagitis present -Well-controlled on daily PPI therapy.  Dyslipidemia -Very much uncontrolled with an LDL of 245, goal less than 70. -Recheck lipids when she returns, suspect we may need to increase her simvastatin from 20 to 40 mg at a minimum, probably add Zetia.  Essential hypertension -Has been well controlled in the past.  Controlled type 2 diabetes mellitus without complication, without long-term current use of insulin ([facility]CC) -At last check in November was uncontrolled with an A1c of 9.7. -She admits to not being compliant with diet, she does take her Metformin. -Check A1c at   next follow-up in the office.  Transaminitis -I suspect this may be NASH given her obesity, marked hyperlipidemia and type 2 diabetes. -Recheck LFTs when she is able to come in office, consider ultrasound.  Morbid obesity ([place]) -Discussed healthy lifestyle, including increased physical activity and better food choices to promote weight loss.  Upper respiratory tract infection, unspecified type -I have advised her to complete out her course of doxycycline. -If still having issues after 2 weeks will likely need in person follow-up.     I discussed the assessment and treatment plan with the patient. The patient was provided an opportunity to ask questions and all were answered. The patient agreed with the plan and demonstrated an understanding of the instructions.   The patient was advised to call back or  seek an in-person evaluation if the symptoms worsen or if the condition fails to improve as anticipated.    Lelon Frohlich, MD  [facility] at [facility]

## 2019-10-09 ENCOUNTER — Other Ambulatory Visit: Payer: Self-pay | Admitting: Family Medicine

## 2019-10-12 NOTE — Telephone Encounter (Signed)
Please see message and advise.  Thank you. ° °

## 2019-11-08 DIAGNOSIS — M47812 Spondylosis without myelopathy or radiculopathy, cervical region: Secondary | ICD-10-CM | POA: Diagnosis not present

## 2019-11-08 DIAGNOSIS — M5030 Other cervical disc degeneration, unspecified cervical region: Secondary | ICD-10-CM | POA: Diagnosis not present

## 2019-11-08 DIAGNOSIS — M47814 Spondylosis without myelopathy or radiculopathy, thoracic region: Secondary | ICD-10-CM | POA: Diagnosis not present

## 2019-11-08 DIAGNOSIS — Z981 Arthrodesis status: Secondary | ICD-10-CM | POA: Diagnosis not present

## 2019-11-08 DIAGNOSIS — Z9889 Other specified postprocedural states: Secondary | ICD-10-CM | POA: Diagnosis not present

## 2019-11-08 DIAGNOSIS — M545 Low back pain: Secondary | ICD-10-CM | POA: Diagnosis not present

## 2019-11-08 DIAGNOSIS — M7918 Myalgia, other site: Secondary | ICD-10-CM | POA: Diagnosis not present

## 2019-12-03 ENCOUNTER — Other Ambulatory Visit: Payer: Self-pay

## 2019-12-03 ENCOUNTER — Encounter: Payer: Self-pay | Admitting: Internal Medicine

## 2019-12-03 ENCOUNTER — Ambulatory Visit (INDEPENDENT_AMBULATORY_CARE_PROVIDER_SITE_OTHER): Payer: Medicare Other | Admitting: Internal Medicine

## 2019-12-03 VITALS — BP 120/88 | HR 95 | Temp 98.7°F | Ht 66.0 in | Wt 235.1 lb

## 2019-12-03 DIAGNOSIS — R7989 Other specified abnormal findings of blood chemistry: Secondary | ICD-10-CM | POA: Diagnosis not present

## 2019-12-03 DIAGNOSIS — K219 Gastro-esophageal reflux disease without esophagitis: Secondary | ICD-10-CM

## 2019-12-03 DIAGNOSIS — E1165 Type 2 diabetes mellitus with hyperglycemia: Secondary | ICD-10-CM

## 2019-12-03 DIAGNOSIS — I10 Essential (primary) hypertension: Secondary | ICD-10-CM

## 2019-12-03 DIAGNOSIS — E785 Hyperlipidemia, unspecified: Secondary | ICD-10-CM

## 2019-12-03 DIAGNOSIS — Z23 Encounter for immunization: Secondary | ICD-10-CM

## 2019-12-03 DIAGNOSIS — E119 Type 2 diabetes mellitus without complications: Secondary | ICD-10-CM

## 2019-12-03 LAB — COMPREHENSIVE METABOLIC PANEL
AG Ratio: 1.7 (calc) (ref 1.0–2.5)
ALT: 97 U/L — ABNORMAL HIGH (ref 6–29)
AST: 51 U/L — ABNORMAL HIGH (ref 10–35)
Albumin: 4.7 g/dL (ref 3.6–5.1)
Alkaline phosphatase (APISO): 128 U/L (ref 37–153)
BUN: 8 mg/dL (ref 7–25)
CO2: 28 mmol/L (ref 20–32)
Calcium: 10.3 mg/dL (ref 8.6–10.4)
Chloride: 100 mmol/L (ref 98–110)
Creat: 0.75 mg/dL (ref 0.50–1.05)
Globulin: 2.8 g/dL (calc) (ref 1.9–3.7)
Glucose, Bld: 223 mg/dL — ABNORMAL HIGH (ref 65–99)
Potassium: 5.4 mmol/L — ABNORMAL HIGH (ref 3.5–5.3)
Sodium: 137 mmol/L (ref 135–146)
Total Bilirubin: 0.7 mg/dL (ref 0.2–1.2)
Total Protein: 7.5 g/dL (ref 6.1–8.1)

## 2019-12-03 LAB — CBC WITH DIFFERENTIAL/PLATELET
Absolute Monocytes: 708 cells/uL (ref 200–950)
Basophils Absolute: 78 cells/uL (ref 0–200)
Basophils Relative: 0.8 %
Eosinophils Absolute: 204 cells/uL (ref 15–500)
Eosinophils Relative: 2.1 %
HCT: 47.6 % — ABNORMAL HIGH (ref 35.0–45.0)
Hemoglobin: 16.1 g/dL — ABNORMAL HIGH (ref 11.7–15.5)
Lymphs Abs: 2803 cells/uL (ref 850–3900)
MCH: 31.3 pg (ref 27.0–33.0)
MCHC: 33.8 g/dL (ref 32.0–36.0)
MCV: 92.4 fL (ref 80.0–100.0)
MPV: 11.4 fL (ref 7.5–12.5)
Monocytes Relative: 7.3 %
Neutro Abs: 5907 cells/uL (ref 1500–7800)
Neutrophils Relative %: 60.9 %
Platelets: 235 10*3/uL (ref 140–400)
RBC: 5.15 10*6/uL — ABNORMAL HIGH (ref 3.80–5.10)
RDW: 11.8 % (ref 11.0–15.0)
Total Lymphocyte: 28.9 %
WBC: 9.7 10*3/uL (ref 3.8–10.8)

## 2019-12-03 LAB — LIPID PANEL
Cholesterol: 251 mg/dL — ABNORMAL HIGH (ref <200)
HDL: 47 mg/dL — ABNORMAL LOW (ref >=50)
LDL Cholesterol (Calc): 170 mg/dL (calc) — ABNORMAL HIGH
Non-HDL Cholesterol (Calc): 204 mg/dL (calc) — ABNORMAL HIGH (ref <130)
Total CHOL/HDL Ratio: 5.3 (calc) — ABNORMAL HIGH (ref <5.0)
Triglycerides: 181 mg/dL — ABNORMAL HIGH (ref <150)

## 2019-12-03 LAB — POCT GLYCOSYLATED HEMOGLOBIN (HGB A1C): Hemoglobin A1C: 9.2 % — AB (ref 4.0–5.6)

## 2019-12-03 MED ORDER — METFORMIN HCL 1000 MG PO TABS: 1000.0000 mg | ORAL_TABLET | Freq: Two times a day (BID) | ORAL | 1 refills | Status: DC

## 2019-12-03 NOTE — Progress Notes (Signed)
Established Patient Office Visit     This visit occurred during the SARS-CoV-2 public health emergency.  Safety protocols were in place, including screening questions prior to the visit, additional usage of staff PPE, and extensive cleaning of exam room while observing appropriate contact time as indicated for disinfecting solutions.    CC/Reason for Visit: Follow-up chronic medical conditions  HPI: Brenda Obrien is a 54 y.o. female who is coming in today for the above mentioned reasons. Past Medical History is significant for: Uncontrolled type 2 diabetes, tension, morbid obesity, GERD.  She also has multiple joint osteoarthritis and degenerative disc disease spine.  She has no acute issues today.  She is requesting a flu vaccine, she has had both of her Covid vaccines, she follows routinely with GYN.  She tells me she has a history of elevated liver enzymes.  She has had issues tolerating maximum dose Metformin.  She is currently taking milligrams twice daily.   Past Medical/Surgical History: Past Medical History:  Diagnosis Date  . Asthma   . Cataract   . Depression   . Diabetes mellitus, type 2 ([place]) 02/2018  . Elevated cholesterol   . Endometriosis   . Generalized anxiety disorder 2017  . GERD (gastroesophageal reflux disease)   . Glaucoma   . 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 Hypertension   . PTSD (post-traumatic stress disorder) 2017    Past Surgical History:  Procedure Laterality Date  . ANTERIOR CRUCIATE LIGAMENT REPAIR  1987,2001,2003,2005  . BACK SURGERY  01/2017   due herniated disc  . CERVICAL FUSION  10/21/2018  . EYE SURGERY Bilateral 2016   release pressure   . LAPAROSCOPIC CHOLECYSTECTOMY  5/14   [place], [place]  . LAPAROSCOPIC TOTAL HYSTERECTOMY  6/11  . left knee surgery  09/2011  . NASAL SINUS SURGERY  1992  . REPLACEMENT TOTAL KNEE Left 9/14   Dr. Laurance Flatten  . SHOULDER SURGERY Right 09/08/2015   due to injury   . SHOULDER SURGERY Right 03/20/2016    due to injury  . SHOULDER SURGERY Left 09/10/2017   due rotator cuff    Social History:  reports that she has been smoking cigarettes. She has been smoking about 1.00 pack per day. She has never used smokeless tobacco. She reports current alcohol use of about 14.0 - 21.0 standard drinks of alcohol per week. She reports that she does not use drugs.  Allergies: No Known Allergies  Family History:  Family History  Problem Relation Age of Onset  . Coronary artery disease Mother        PAD  . Heart attack Mother 24  . Diabetes Mother   . Lung cancer Father   . Heart attack Father 37  . Hypertension Brother   . High Cholesterol Brother      Current Outpatient Medications:  .  Blood Glucose Monitoring Suppl (CONTOUR MONITOR) w/Device KIT, 1 Device by Does not apply route daily. Patient is to test once daily. Dx E11.9, Disp: 1 kit, Rfl: 1 .  diclofenac sodium (VOLTAREN) 1 % GEL, Apply 3 gm to 3 large joints up to 3 times a day.Dispense 3 tubes with 3 refills., Disp: 5 Tube, Rfl: 0 .  glucose blood (CONTOUR NEXT TEST) test strip, Use to check sugar daily Dx E11.9, Disp: 50 strip, Rfl: 3 .  lisinopril (ZESTRIL) 10 MG tablet, Take 1 tablet (10 mg total) by mouth daily., Disp: 90 tablet, Rfl: 3 .  metFORMIN (GLUCOPHAGE) 1000 MG tablet, Take  1 tablet (1,000 mg total) by mouth 2 (two) times daily with a meal., Disp: 180 tablet, Rfl: 1 .  Microlet Lancets MISC, USE AS DIRECTED EVERY DAY, Disp: 100 each, Rfl: 11 .  nebivolol (BYSTOLIC) 5 MG tablet, TAKE 1 TABLET BY MOUTH EVERY DAY (DISCONTINUE 2.5MG), Disp: 30 tablet, Rfl: 2 .  omeprazole (PRILOSEC) 40 MG capsule, TAKE 1 CAPSULE BY MOUTH EVERY DAY, Disp: 30 capsule, Rfl: 11 .  scopolamine (TRANSDERM-SCOP) 1 MG/3DAYS, Place 1 patch (1.5 mg total) onto the skin every 3 (three) days., Disp: 4 patch, Rfl: 2 .  simvastatin (ZOCOR) 20 MG tablet, TAKE 1 TABLET BY MOUTH EVERY DAY, Disp: 30 tablet, Rfl: 0 .  timolol (BETIMOL) 0.25 % ophthalmic solution,  1-2 drops 2 (two) times daily., Disp: , Rfl:   Review of Systems:  Constitutional: Denies fever, chills, diaphoresis, appetite change and fatigue.  HEENT: Denies photophobia, eye pain, redness, hearing loss, ear pain, congestion, sore throat, rhinorrhea, sneezing, mouth sores, trouble swallowing, neck pain, neck stiffness and tinnitus.   Respiratory: Denies SOB, DOE, cough, chest tightness,  and wheezing.   Cardiovascular: Denies chest pain, palpitations and leg swelling.  Gastrointestinal: Denies nausea, vomiting, abdominal pain, diarrhea, constipation, blood in stool and abdominal distention.  Genitourinary: Denies dysuria, urgency, frequency, hematuria, flank pain and difficulty urinating.  Endocrine: Denies: hot or cold intolerance, sweats, changes in hair or nails, polyuria, polydipsia. Musculoskeletal: Denies myalgias, back pain, joint swelling, arthralgias and gait problem.  Skin: Denies pallor, rash and wound.  Neurological: Denies dizziness, seizures, syncope, weakness, light-headedness, numbness and headaches.  Hematological: Denies adenopathy. Easy bruising, personal or family bleeding history  Psychiatric/Behavioral: Denies suicidal ideation, mood changes, confusion, nervousness, sleep disturbance and agitation    Physical Exam: Vitals:   12/03/19 0842  BP: 120/88  Pulse: 95  Temp: 98.7 F (37.1 C)  TempSrc: Oral  SpO2: 96%  Weight: 235 lb 1.6 oz (106.6 kg)  Height: 5' 6"  (1.676 m)    Body mass index is 37.95 kg/m.   Constitutional: NAD, calm, comfortable Eyes: PERRL, lids and conjunctivae normal ENMT: Mucous membranes are moist.   Respiratory: clear to auscultation bilaterally, no wheezing, no crackles. Normal respiratory effort. No accessory muscle use.  Cardiovascular: Regular rate and rhythm, no murmurs / rubs / gallops. No extremity edema.  Abdomen: no tenderness, no masses palpated. No hepatosplenomegaly. Bowel sounds positive.  Psychiatric: Normal judgment  and insight. Alert and oriented x 3. Normal mood.    Impression and Plan:  Elevated liver function tests -Check CMP today.  Dyslipidemia  - Plan: Lipid panel -Her last LDL was extremely elevated at 245 in November 2020, suspect we will need to increase her statin dose.  Essential hypertension -Well-controlled.  Gastroesophageal reflux disease, unspecified whether esophagitis present -Well-controlled on daily PPI therapy.  Uncontrolled type 2 diabetes mellitus with hyperglycemia ([place]) -Even though she has had issues tolerating maximal dose Metformin in the past, she is willing to give it a try. -She will increase Metformin to 1000 mg twice daily, she has been advised to allow 2 weeks for diarrhea and stomach upset to abate before calling.   Patient Instructions  -Nice seeing you today!!  -Lab work today; will notify you once results are available.  -Increase metformin to 1000 mg BID.  -Schedule follow up in 3 months.       Lelon Frohlich, MD [facility] at [facility]

## 2019-12-03 NOTE — Patient Instructions (Signed)
-  Nice seeing you today!!  -Lab work today; will notify you once results are available.  -Increase metformin to 1000 mg BID.  -Schedule follow up in 3 months.

## 2019-12-16 ENCOUNTER — Other Ambulatory Visit: Payer: Self-pay | Admitting: Internal Medicine

## 2019-12-16 DIAGNOSIS — R7989 Other specified abnormal findings of blood chemistry: Secondary | ICD-10-CM

## 2019-12-16 MED ORDER — SIMVASTATIN 40 MG PO TABS
40.0000 mg | ORAL_TABLET | Freq: Every day | ORAL | 1 refills | Status: DC
Start: 2019-12-16 — End: 2020-06-30

## 2020-01-19 ENCOUNTER — Telehealth: Payer: Self-pay | Admitting: Internal Medicine

## 2020-01-19 NOTE — Telephone Encounter (Signed)
Was there a question here?

## 2020-01-19 NOTE — Telephone Encounter (Signed)
The patient talked to the triage nurse and they told her to hang up and call the office back.   The patient is having back spasms and the only thing that works for her is a 2 part injection in her butt call Robaxen.  The only injection that we give at the office is Toridal.  She said that she needs a muscle relaxer not a pain medicine.  The patient got the 2 part injection from the emergency room but she doesn't want to go there because of COVID. She will figure out somewhere else to go.

## 2020-01-19 NOTE — Telephone Encounter (Signed)
Just a FYI

## 2020-02-07 ENCOUNTER — Telehealth: Payer: Self-pay

## 2020-02-07 NOTE — Telephone Encounter (Signed)
Patient called in for a refill request   nebivolol (BYSTOLIC) 5 MG tablet    [id number] - [place] - [address] [place]

## 2020-02-08 MED ORDER — NEBIVOLOL HCL 5 MG PO TABS: ORAL_TABLET | ORAL | 2 refills | Status: DC

## 2020-02-08 NOTE — Telephone Encounter (Signed)
Refill sent.

## 2020-02-17 DIAGNOSIS — E119 Type 2 diabetes mellitus without complications: Secondary | ICD-10-CM | POA: Diagnosis not present

## 2020-02-17 LAB — HM DIABETES EYE EXAM

## 2020-02-24 ENCOUNTER — Encounter: Payer: Self-pay | Admitting: Internal Medicine

## 2020-03-08 ENCOUNTER — Other Ambulatory Visit: Payer: Self-pay | Admitting: Internal Medicine

## 2020-03-08 DIAGNOSIS — K219 Gastro-esophageal reflux disease without esophagitis: Secondary | ICD-10-CM

## 2020-03-08 MED ORDER — OMEPRAZOLE 40 MG PO CPDR: DELAYED_RELEASE_CAPSULE | ORAL | 0 refills | Status: DC

## 2020-03-08 NOTE — Telephone Encounter (Signed)
Pt is calling in stating that she is out and need a refill on Rx omeprazole (PRIOSEC) 40 MG.  Pharm:  [facility] [address]

## 2020-03-16 ENCOUNTER — Ambulatory Visit: Payer: Medicare Other | Admitting: Nurse Practitioner

## 2020-03-21 DIAGNOSIS — Z23 Encounter for immunization: Secondary | ICD-10-CM | POA: Diagnosis not present

## 2020-03-29 ENCOUNTER — Ambulatory Visit (INDEPENDENT_AMBULATORY_CARE_PROVIDER_SITE_OTHER): Payer: Medicare Other | Admitting: Obstetrics & Gynecology

## 2020-03-29 ENCOUNTER — Other Ambulatory Visit: Payer: Self-pay

## 2020-03-29 ENCOUNTER — Encounter: Payer: Self-pay | Admitting: Obstetrics & Gynecology

## 2020-03-29 VITALS — BP 142/87 | HR 94 | Ht 66.0 in | Wt 232.0 lb

## 2020-03-29 DIAGNOSIS — M5136 Other intervertebral disc degeneration, lumbar region: Secondary | ICD-10-CM

## 2020-03-29 DIAGNOSIS — Z01419 Encounter for gynecological examination (general) (routine) without abnormal findings: Secondary | ICD-10-CM | POA: Diagnosis not present

## 2020-03-29 DIAGNOSIS — E785 Hyperlipidemia, unspecified: Secondary | ICD-10-CM | POA: Diagnosis not present

## 2020-03-29 DIAGNOSIS — E1165 Type 2 diabetes mellitus with hyperglycemia: Secondary | ICD-10-CM | POA: Diagnosis not present

## 2020-03-29 DIAGNOSIS — I10 Essential (primary) hypertension: Secondary | ICD-10-CM

## 2020-03-29 NOTE — Progress Notes (Signed)
53 y.o. G0P0000 Single White or Caucasian female here for annual exam.  Has type II DM and last hba1C was 9.2.  On 1071m metformin BID.  Has follow up lab work in January.  On disability.  PCP moved and she doesn't know where.  Has seen Dr. HIsaac Blissonce and has follow up planned in January.  H/o multiple joint surgeries.  Needs hip replacement as well.  Orthopedist said diabetes must be in control before having it done so working on this.    No LMP recorded. Patient has had a hysterectomy.          The current method of family planning is status post hysterectomy.    Exercising: No. Smoker:  yes  Health Maintenance: Pap:  Not indicated History of abnormal Pap:  no MMG:  03/16/2020 Colonoscopy:  Cologuard 10/11/2017 negative. BMD:   never TDaP:  2019 Pneumonia vaccine(s):  2020 Shingrix:   Declines Hep C testing: 03/18/16 neg Screening Labs: Dr. HIsaac Bliss  reports that she has been smoking cigarettes. She has been smoking about 1.00 pack per day. She has never used smokeless tobacco. She reports current alcohol use of about 14.0 - 21.0 standard drinks of alcohol per week. She reports that she does not use drugs.  Past Medical History:  Diagnosis Date  . Asthma   . Cataract   . Depression   . Diabetes mellitus, type 2 ([place] 02/2018  . Elevated cholesterol   . Endometriosis   . Generalized anxiety disorder 2017  . GERD (gastroesophageal reflux disease)   . Glaucoma   . 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 Hypertension   . PTSD (post-traumatic stress disorder) 2017    Past Surgical History:  Procedure Laterality Date  . ANTERIOR CRUCIATE LIGAMENT REPAIR  1987,2001,2003,2005  . BACK SURGERY  01/2017   due herniated disc  . CERVICAL FUSION  10/21/2018  . EYE SURGERY Bilateral 2016   release pressure   . [place] LAPAROSCOPIC TOTAL HYSTERECTOMY  6/11  . left knee surgery  09/2011  . NASAL SINUS SURGERY  1992  . REPLACEMENT TOTAL  KNEE Left 9/14   Dr. DLaurance Flatten . SHOULDER SURGERY Right 09/08/2015   due to injury   . SHOULDER SURGERY Right 03/20/2016   due to injury  . SHOULDER SURGERY Left 09/10/2017   due rotator cuff    Current Outpatient Medications  Medication Sig Dispense Refill  . Blood Glucose Monitoring Suppl (CONTOUR MONITOR) w/Device KIT 1 Device by Does not apply route daily. Patient is to test once daily. Dx E11.9 1 kit 1  . diclofenac sodium (VOLTAREN) 1 % GEL Apply 3 gm to 3 large joints up to 3 times a day.Dispense 3 tubes with 3 refills. 5 Tube 0  . glucose blood (CONTOUR NEXT TEST) test strip Use to check sugar daily Dx E11.9 50 strip 3  . lisinopril (ZESTRIL) 10 MG tablet Take 1 tablet (10 mg total) by mouth daily. 90 tablet 3  . metFORMIN (GLUCOPHAGE) 1000 MG tablet Take 1 tablet (1,000 mg total) by mouth 2 (two) times daily with a meal. 180 tablet 1  . Microlet Lancets MISC USE AS DIRECTED EVERY DAY 100 each 11  . nebivolol (BYSTOLIC) 5 MG tablet TAKE 1 TABLET BY MOUTH EVERY DAY (DISCONTINUE 2.5MG) 90 tablet 2  . omeprazole (PRILOSEC) 40 MG capsule TAKE 1 CAPSULE BY MOUTH EVERY DAY 90 capsule 0  . scopolamine (TRANSDERM-SCOP) 1 MG/3DAYS Place  1 patch (1.5 mg total) onto the skin every 3 (three) days. 4 patch 2  . simvastatin (ZOCOR) 40 MG tablet Take 1 tablet (40 mg total) by mouth daily. 90 tablet 1  . timolol (BETIMOL) 0.25 % ophthalmic solution 1-2 drops 2 (two) times daily.     No current facility-administered medications for this visit.    Family History  Problem Relation Age of Onset  . Coronary artery disease Mother        PAD  . Heart attack Mother 78  . Diabetes Mother   . Lung cancer Father   . Heart attack Father 73  . Hypertension Brother   . High Cholesterol Brother     Review of Systems  Exam:   BP (!) 142/87   Pulse 94   Ht _0  (1.676 m)   Wt 232 lb (105.2 kg)   BMI 37.45 kg/m   Height: _1  (167.6 cm)  General appearance: alert, cooperative and appears  stated age Head: Normocephalic, without obvious abnormality, atraumatic Neck: no adenopathy, supple, symmetrical, trachea midline and thyroid normal to inspection and palpation Lungs: clear to auscultation bilaterally Breasts: normal appearance, no masses or tenderness Heart: regular rate and rhythm Abdomen: soft, non-tender; bowel sounds normal; no masses,  no organomegaly Extremities: extremities normal, atraumatic, no cyanosis or edema Skin: Skin color, texture, turgor normal. No rashes or lesions Lymph nodes: Cervical, supraclavicular, and axillary nodes normal. No abnormal inguinal nodes palpated Neurologic: Grossly normal   Pelvic: External genitalia:  no lesions              Urethra:  normal appearing urethra with no masses, tenderness or lesions              Bartholins and Skenes: normal                 Vagina: normal appearing vagina with normal color and discharge, no lesions              Cervix: absent              Pap taken: No. Bimanual Exam:  Uterus:  uterus absent              Adnexa: no mass, fullness, tenderness               Rectovaginal: Confirms               Anus:  normal sphincter tone, no lesions  Chaperone was present for exam.  Assessment/Plan: 1. Well woman exam with routine gynecological exam - Pap not indicated due to h/o TLH (ovaries remain) - MMG 03/15/2020.  Release from [facility] signed today. - Cologuard neg 2019.  Will need again 2022. - Consider BMD around age 23 - Lab work/vaccines with PCP - Consider follow up 2 years  2. Type 2 diabetes mellitus with hyperglycemia, without long-term current use of insulin ([place]) - Has blood work scheduled 04/2020 with Dr. Isaac Bliss  3.  H/o depression - followed by psychiatry/therapist and on medication  4.  Hypertension - followed by PCP and on medication  5.  Elevated lipids - followed by PCP and on medication  6.  H/o multiple joint surgeries - hip replacement is in her future but working on  better blood sugar control

## 2020-04-04 ENCOUNTER — Ambulatory Visit: Payer: Medicare Other | Admitting: Internal Medicine

## 2020-04-26 DIAGNOSIS — M170 Bilateral primary osteoarthritis of knee: Secondary | ICD-10-CM | POA: Diagnosis not present

## 2020-04-26 DIAGNOSIS — M1711 Unilateral primary osteoarthritis, right knee: Secondary | ICD-10-CM | POA: Diagnosis not present

## 2020-04-26 DIAGNOSIS — M2241 Chondromalacia patellae, right knee: Secondary | ICD-10-CM | POA: Diagnosis not present

## 2020-04-26 DIAGNOSIS — Z96652 Presence of left artificial knee joint: Secondary | ICD-10-CM | POA: Diagnosis not present

## 2020-04-27 DIAGNOSIS — M25561 Pain in right knee: Secondary | ICD-10-CM | POA: Diagnosis not present

## 2020-04-27 DIAGNOSIS — M25661 Stiffness of right knee, not elsewhere classified: Secondary | ICD-10-CM | POA: Diagnosis not present

## 2020-04-27 DIAGNOSIS — M2241 Chondromalacia patellae, right knee: Secondary | ICD-10-CM | POA: Diagnosis not present

## 2020-04-27 DIAGNOSIS — M6281 Muscle weakness (generalized): Secondary | ICD-10-CM | POA: Diagnosis not present

## 2020-05-04 DIAGNOSIS — M6281 Muscle weakness (generalized): Secondary | ICD-10-CM | POA: Diagnosis not present

## 2020-05-04 DIAGNOSIS — M2241 Chondromalacia patellae, right knee: Secondary | ICD-10-CM | POA: Diagnosis not present

## 2020-05-04 DIAGNOSIS — M25661 Stiffness of right knee, not elsewhere classified: Secondary | ICD-10-CM | POA: Diagnosis not present

## 2020-05-04 DIAGNOSIS — M25561 Pain in right knee: Secondary | ICD-10-CM | POA: Diagnosis not present

## 2020-05-08 DIAGNOSIS — M2241 Chondromalacia patellae, right knee: Secondary | ICD-10-CM | POA: Diagnosis not present

## 2020-05-08 DIAGNOSIS — M6281 Muscle weakness (generalized): Secondary | ICD-10-CM | POA: Diagnosis not present

## 2020-05-08 DIAGNOSIS — M25561 Pain in right knee: Secondary | ICD-10-CM | POA: Diagnosis not present

## 2020-05-08 DIAGNOSIS — M25661 Stiffness of right knee, not elsewhere classified: Secondary | ICD-10-CM | POA: Diagnosis not present

## 2020-05-09 ENCOUNTER — Other Ambulatory Visit: Payer: Self-pay

## 2020-05-10 ENCOUNTER — Encounter: Payer: Self-pay | Admitting: Internal Medicine

## 2020-05-10 ENCOUNTER — Ambulatory Visit (INDEPENDENT_AMBULATORY_CARE_PROVIDER_SITE_OTHER): Payer: Medicare Other | Admitting: Internal Medicine

## 2020-05-10 VITALS — BP 140/90 | HR 95 | Temp 98.2°F | Wt 234.6 lb

## 2020-05-10 DIAGNOSIS — E1165 Type 2 diabetes mellitus with hyperglycemia: Secondary | ICD-10-CM

## 2020-05-10 DIAGNOSIS — E785 Hyperlipidemia, unspecified: Secondary | ICD-10-CM

## 2020-05-10 DIAGNOSIS — M62830 Muscle spasm of back: Secondary | ICD-10-CM

## 2020-05-10 DIAGNOSIS — M549 Dorsalgia, unspecified: Secondary | ICD-10-CM | POA: Diagnosis not present

## 2020-05-10 DIAGNOSIS — I10 Essential (primary) hypertension: Secondary | ICD-10-CM | POA: Diagnosis not present

## 2020-05-10 LAB — POCT GLYCOSYLATED HEMOGLOBIN (HGB A1C): Hemoglobin A1C: 10.1 % — AB (ref 4.0–5.6)

## 2020-05-10 MED ORDER — KETOROLAC TROMETHAMINE 60 MG/2ML IM SOLN
60.0000 mg | Freq: Once | INTRAMUSCULAR | Status: AC
Start: 2020-05-10 — End: 2020-05-10
  Administered 2020-05-10: 60 mg via INTRAMUSCULAR

## 2020-05-10 MED ORDER — TRULICITY 0.75 MG/0.5ML ~~LOC~~ SOAJ: 0.7500 mg | SUBCUTANEOUS | 2 refills | Status: DC

## 2020-05-10 NOTE — Progress Notes (Signed)
Established Patient Office Visit     This visit occurred during the SARS-CoV-2 public health emergency.  Safety protocols were in place, including screening questions prior to the visit, additional usage of staff PPE, and extensive cleaning of exam room while observing appropriate contact time as indicated for disinfecting solutions.    CC/Reason for Visit: Follow-up chronic conditions, back spasm  HPI: Brenda Obrien is a 55 y.o. female who is coming in today for the above mentioned reasons. Past Medical History is significant for: Uncontrolled type 2 diabetes, hypertension, morbid obesity, hyperlipidemia, GERD, she also has multiple joint osteoarthritis and degenerative disc disease of the lumbar spine.  She is having severe back spasm.  She is known to have these from time to time.  Pain is over her left buttock, very difficult for her to stand or sit down.  She is currently attending physical therapy.  Current spasm has been present for 4 days.  She has some Flexeril and has been taking it at home without significant relief.  She continues to take her Metformin at maximal dose, she does admits to significant diet indiscretions over the holidays.  She is fully vaccinated against Covid and influenza.   Past Medical/Surgical History: Past Medical History:  Diagnosis Date  . Asthma   . Cataract   . Depression   . Diabetes mellitus, type 2 ([place]) 02/2018  . Elevated cholesterol   . Endometriosis   . Generalized anxiety disorder 2017  . GERD (gastroesophageal reflux disease)   . Glaucoma   . 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 Hypertension   . PTSD (post-traumatic stress disorder) 2017    Past Surgical History:  Procedure Laterality Date  . ANTERIOR CRUCIATE LIGAMENT REPAIR  1987,2001,2003,2005  . BACK SURGERY  01/2017   due herniated disc  . CERVICAL FUSION  10/21/2018  . EYE SURGERY Bilateral 2016   release pressure   . LAPAROSCOPIC CHOLECYSTECTOMY  5/14   [place]  .  LAPAROSCOPIC TOTAL HYSTERECTOMY  6/11  . left knee surgery  09/2011  . NASAL SINUS SURGERY  1992  . REPLACEMENT TOTAL KNEE Left 9/14   Dr. Laurance Flatten  . SHOULDER SURGERY Right 09/08/2015   due to injury   . SHOULDER SURGERY Right 03/20/2016   due to injury  . SHOULDER SURGERY Left 09/10/2017   due rotator cuff    Social History:  reports that she has been smoking cigarettes. She has been smoking about 1.00 pack per day. She has never used smokeless tobacco. She reports current alcohol use of about 14.0 - 21.0 standard drinks of alcohol per week. She reports that she does not use drugs.  Allergies: No Known Allergies  Family History:  Family History  Problem Relation Age of Onset  . Coronary artery disease Mother        PAD  . Heart attack Mother 2  . Diabetes Mother   . Lung cancer Father   . Heart attack Father 60  . Hypertension Brother   . High Cholesterol Brother      Current Outpatient Medications:  .  Blood Glucose Monitoring Suppl (CONTOUR MONITOR) w/Device KIT, 1 Device by Does not apply route daily. Patient is to test once daily. Dx E11.9, Disp: 1 kit, Rfl: 1 .  diclofenac sodium (VOLTAREN) 1 % GEL, Apply 3 gm to 3 large joints up to 3 times a day.Dispense 3 tubes with 3 refills., Disp: 5 Tube, Rfl: 0 .  Dulaglutide (TRULICITY) 9.32 IZ/1.2WP SOPN, Inject  0.75 mg into the skin once a week., Disp: 2 mL, Rfl: 2 .  glucose blood (CONTOUR NEXT TEST) test strip, Use to check sugar daily Dx E11.9, Disp: 50 strip, Rfl: 3 .  lisinopril (ZESTRIL) 10 MG tablet, Take 1 tablet (10 mg total) by mouth daily., Disp: 90 tablet, Rfl: 3 .  metFORMIN (GLUCOPHAGE) 1000 MG tablet, Take 1 tablet (1,000 mg total) by mouth 2 (two) times daily with a meal., Disp: 180 tablet, Rfl: 1 .  Microlet Lancets MISC, USE AS DIRECTED EVERY DAY, Disp: 100 each, Rfl: 11 .  nebivolol (BYSTOLIC) 5 MG tablet, TAKE 1 TABLET BY MOUTH EVERY DAY (DISCONTINUE 2.5MG), Disp: 90 tablet, Rfl: 2 .  omeprazole  (PRILOSEC) 40 MG capsule, TAKE 1 CAPSULE BY MOUTH EVERY DAY, Disp: 90 capsule, Rfl: 0 .  scopolamine (TRANSDERM-SCOP) 1 MG/3DAYS, Place 1 patch (1.5 mg total) onto the skin every 3 (three) days., Disp: 4 patch, Rfl: 2 .  simvastatin (ZOCOR) 40 MG tablet, Take 1 tablet (40 mg total) by mouth daily., Disp: 90 tablet, Rfl: 1 .  timolol (BETIMOL) 0.25 % ophthalmic solution, 1-2 drops 2 (two) times daily., Disp: , Rfl:   Current Facility-Administered Medications:  .  ketorolac (TORADOL) injection 60 mg, 60 mg, Intramuscular, Once, Isaac Bliss, Rayford Halsted, MD  Review of Systems:  Constitutional: Denies fever, chills, diaphoresis, appetite change and fatigue.  HEENT: Denies photophobia, eye pain, redness, hearing loss, ear pain, congestion, sore throat, rhinorrhea, sneezing, mouth sores, trouble swallowing, neck pain, neck stiffness and tinnitus.   Respiratory: Denies SOB, DOE, cough, chest tightness,  and wheezing.   Cardiovascular: Denies chest pain, palpitations and leg swelling.  Gastrointestinal: Denies nausea, vomiting, abdominal pain, diarrhea, constipation, blood in stool and abdominal distention.  Genitourinary: Denies dysuria, urgency, frequency, hematuria, flank pain and difficulty urinating.  Endocrine: Denies: hot or cold intolerance, sweats, changes in hair or nails, polyuria, polydipsia. Musculoskeletal: Positive for myalgias, back pain, joint swelling, arthralgias and gait problem.  Skin: Denies pallor, rash and wound.  Neurological: Denies dizziness, seizures, syncope, weakness, light-headedness, numbness and headaches.  Hematological: Denies adenopathy. Easy bruising, personal or family bleeding history  Psychiatric/Behavioral: Denies suicidal ideation, mood changes, confusion, nervousness, sleep disturbance and agitation    Physical Exam: Vitals:   05/10/20 0810  BP: 140/90  Pulse: 95  Temp: 98.2 F (36.8 C)  TempSrc: Oral  SpO2: 95%  Weight: 234 lb 9.6 oz (106.4 kg)     Body mass index is 37.87 kg/m.   Constitutional: NAD, calm, comfortable, obese Eyes: PERRL, lids and conjunctivae normal, wears corrective lenses ENMT: Mucous membranes are moist.  Respiratory: clear to auscultation bilaterally, no wheezing, no crackles. Normal respiratory effort. No accessory muscle use.  Cardiovascular: Regular rate and rhythm, no murmurs / rubs / gallops. No extremity edema. Psychiatric: Normal judgment and insight. Alert and oriented x 3. Normal mood.    Impression and Plan:  Uncontrolled type 2 diabetes mellitus with hyperglycemia ([place]) -Uncontrolled and worsened with an A1c of 10.1 in office today. -Continue maximal dose Metformin. -Start Trulicity 2.95 mg weekly, first injection given in office today. -We have discussed diet at length, she will incorporate lifestyle changes. -Follow-up with me in 3 months.  Back pain, unspecified back location, unspecified back pain laterality, unspecified chronicity  - Plan: ketorolac (TORADOL) injection 60 mg -She will continue to use Flexeril 2-3 times a day that she has at home. -Continue PT.  Hyperlipidemia, unspecified hyperlipidemia type -Last LDL was above goal at 70 at 170  in August 2021. -She was restarted on simvastatin 40 mg. -Recheck lipids when she returns for CPE.  Primary hypertension -A little above goal today, however she is in significant pain, have asked her to do ambulatory measurements and we will recheck when she returns.   Patient Instructions  -Nice seeing you today!!  -Toradol injection in office today.  -Start Trulicity 3.11 mg injected weekly.  -Schedule follow up in 3 months.     Lelon Frohlich, MD [facility]

## 2020-05-10 NOTE — Patient Instructions (Signed)
-  Nice seeing you today!!  -Toradol injection in office today.  -Start Trulicity 0.75 mg injected weekly.  -Schedule follow up in 3 months.

## 2020-05-15 DIAGNOSIS — M6281 Muscle weakness (generalized): Secondary | ICD-10-CM | POA: Diagnosis not present

## 2020-05-15 DIAGNOSIS — M25661 Stiffness of right knee, not elsewhere classified: Secondary | ICD-10-CM | POA: Diagnosis not present

## 2020-05-15 DIAGNOSIS — M25561 Pain in right knee: Secondary | ICD-10-CM | POA: Diagnosis not present

## 2020-05-15 DIAGNOSIS — M2241 Chondromalacia patellae, right knee: Secondary | ICD-10-CM | POA: Diagnosis not present

## 2020-05-18 DIAGNOSIS — M25661 Stiffness of right knee, not elsewhere classified: Secondary | ICD-10-CM | POA: Diagnosis not present

## 2020-05-18 DIAGNOSIS — M2241 Chondromalacia patellae, right knee: Secondary | ICD-10-CM | POA: Diagnosis not present

## 2020-05-18 DIAGNOSIS — M25561 Pain in right knee: Secondary | ICD-10-CM | POA: Diagnosis not present

## 2020-05-18 DIAGNOSIS — M6281 Muscle weakness (generalized): Secondary | ICD-10-CM | POA: Diagnosis not present

## 2020-05-22 DIAGNOSIS — M6281 Muscle weakness (generalized): Secondary | ICD-10-CM | POA: Diagnosis not present

## 2020-05-22 DIAGNOSIS — M25561 Pain in right knee: Secondary | ICD-10-CM | POA: Diagnosis not present

## 2020-05-22 DIAGNOSIS — M25661 Stiffness of right knee, not elsewhere classified: Secondary | ICD-10-CM | POA: Diagnosis not present

## 2020-05-22 DIAGNOSIS — M2241 Chondromalacia patellae, right knee: Secondary | ICD-10-CM | POA: Diagnosis not present

## 2020-05-25 DIAGNOSIS — M6281 Muscle weakness (generalized): Secondary | ICD-10-CM | POA: Diagnosis not present

## 2020-05-25 DIAGNOSIS — M25661 Stiffness of right knee, not elsewhere classified: Secondary | ICD-10-CM | POA: Diagnosis not present

## 2020-05-25 DIAGNOSIS — M25561 Pain in right knee: Secondary | ICD-10-CM | POA: Diagnosis not present

## 2020-05-25 DIAGNOSIS — M2241 Chondromalacia patellae, right knee: Secondary | ICD-10-CM | POA: Diagnosis not present

## 2020-05-29 DIAGNOSIS — M25561 Pain in right knee: Secondary | ICD-10-CM | POA: Diagnosis not present

## 2020-05-29 DIAGNOSIS — M6281 Muscle weakness (generalized): Secondary | ICD-10-CM | POA: Diagnosis not present

## 2020-05-29 DIAGNOSIS — M2241 Chondromalacia patellae, right knee: Secondary | ICD-10-CM | POA: Diagnosis not present

## 2020-05-29 DIAGNOSIS — M25661 Stiffness of right knee, not elsewhere classified: Secondary | ICD-10-CM | POA: Diagnosis not present

## 2020-06-01 DIAGNOSIS — M25661 Stiffness of right knee, not elsewhere classified: Secondary | ICD-10-CM | POA: Diagnosis not present

## 2020-06-01 DIAGNOSIS — M2241 Chondromalacia patellae, right knee: Secondary | ICD-10-CM | POA: Diagnosis not present

## 2020-06-01 DIAGNOSIS — M25561 Pain in right knee: Secondary | ICD-10-CM | POA: Diagnosis not present

## 2020-06-01 DIAGNOSIS — M6281 Muscle weakness (generalized): Secondary | ICD-10-CM | POA: Diagnosis not present

## 2020-06-02 ENCOUNTER — Other Ambulatory Visit: Payer: Self-pay | Admitting: Internal Medicine

## 2020-06-02 DIAGNOSIS — E1165 Type 2 diabetes mellitus with hyperglycemia: Secondary | ICD-10-CM

## 2020-06-02 DIAGNOSIS — K219 Gastro-esophageal reflux disease without esophagitis: Secondary | ICD-10-CM

## 2020-06-05 DIAGNOSIS — M6281 Muscle weakness (generalized): Secondary | ICD-10-CM | POA: Diagnosis not present

## 2020-06-05 DIAGNOSIS — M2241 Chondromalacia patellae, right knee: Secondary | ICD-10-CM | POA: Diagnosis not present

## 2020-06-05 DIAGNOSIS — M25661 Stiffness of right knee, not elsewhere classified: Secondary | ICD-10-CM | POA: Diagnosis not present

## 2020-06-05 DIAGNOSIS — M25561 Pain in right knee: Secondary | ICD-10-CM | POA: Diagnosis not present

## 2020-06-06 ENCOUNTER — Other Ambulatory Visit: Payer: Self-pay | Admitting: *Deleted

## 2020-06-06 MED ORDER — LISINOPRIL 10 MG PO TABS: 10.0000 mg | ORAL_TABLET | Freq: Every day | ORAL | 1 refills | Status: DC

## 2020-06-08 DIAGNOSIS — S43422D Sprain of left rotator cuff capsule, subsequent encounter: Secondary | ICD-10-CM | POA: Diagnosis not present

## 2020-06-08 DIAGNOSIS — M25661 Stiffness of right knee, not elsewhere classified: Secondary | ICD-10-CM | POA: Diagnosis not present

## 2020-06-08 DIAGNOSIS — M25561 Pain in right knee: Secondary | ICD-10-CM | POA: Diagnosis not present

## 2020-06-08 DIAGNOSIS — M6281 Muscle weakness (generalized): Secondary | ICD-10-CM | POA: Diagnosis not present

## 2020-06-12 DIAGNOSIS — M25661 Stiffness of right knee, not elsewhere classified: Secondary | ICD-10-CM | POA: Diagnosis not present

## 2020-06-12 DIAGNOSIS — M6281 Muscle weakness (generalized): Secondary | ICD-10-CM | POA: Diagnosis not present

## 2020-06-12 DIAGNOSIS — M2241 Chondromalacia patellae, right knee: Secondary | ICD-10-CM | POA: Diagnosis not present

## 2020-06-12 DIAGNOSIS — M25561 Pain in right knee: Secondary | ICD-10-CM | POA: Diagnosis not present

## 2020-06-14 DIAGNOSIS — H401131 Primary open-angle glaucoma, bilateral, mild stage: Secondary | ICD-10-CM | POA: Diagnosis not present

## 2020-06-15 DIAGNOSIS — M25561 Pain in right knee: Secondary | ICD-10-CM | POA: Diagnosis not present

## 2020-06-15 DIAGNOSIS — M25661 Stiffness of right knee, not elsewhere classified: Secondary | ICD-10-CM | POA: Diagnosis not present

## 2020-06-15 DIAGNOSIS — M6281 Muscle weakness (generalized): Secondary | ICD-10-CM | POA: Diagnosis not present

## 2020-06-15 DIAGNOSIS — M2241 Chondromalacia patellae, right knee: Secondary | ICD-10-CM | POA: Diagnosis not present

## 2020-06-19 DIAGNOSIS — M6281 Muscle weakness (generalized): Secondary | ICD-10-CM | POA: Diagnosis not present

## 2020-06-19 DIAGNOSIS — M25661 Stiffness of right knee, not elsewhere classified: Secondary | ICD-10-CM | POA: Diagnosis not present

## 2020-06-19 DIAGNOSIS — M25561 Pain in right knee: Secondary | ICD-10-CM | POA: Diagnosis not present

## 2020-06-19 DIAGNOSIS — M2241 Chondromalacia patellae, right knee: Secondary | ICD-10-CM | POA: Diagnosis not present

## 2020-06-21 DIAGNOSIS — M2241 Chondromalacia patellae, right knee: Secondary | ICD-10-CM | POA: Diagnosis not present

## 2020-06-21 DIAGNOSIS — M160 Bilateral primary osteoarthritis of hip: Secondary | ICD-10-CM | POA: Diagnosis not present

## 2020-06-21 DIAGNOSIS — M170 Bilateral primary osteoarthritis of knee: Secondary | ICD-10-CM | POA: Diagnosis not present

## 2020-06-21 DIAGNOSIS — Z96652 Presence of left artificial knee joint: Secondary | ICD-10-CM | POA: Diagnosis not present

## 2020-06-30 ENCOUNTER — Other Ambulatory Visit: Payer: Self-pay | Admitting: Internal Medicine

## 2020-07-12 DIAGNOSIS — S83241A Other tear of medial meniscus, current injury, right knee, initial encounter: Secondary | ICD-10-CM | POA: Diagnosis not present

## 2020-07-26 DIAGNOSIS — Z96652 Presence of left artificial knee joint: Secondary | ICD-10-CM | POA: Diagnosis not present

## 2020-07-26 DIAGNOSIS — T8484XD Pain due to internal orthopedic prosthetic devices, implants and grafts, subsequent encounter: Secondary | ICD-10-CM | POA: Diagnosis not present

## 2020-07-26 DIAGNOSIS — M1612 Unilateral primary osteoarthritis, left hip: Secondary | ICD-10-CM | POA: Diagnosis not present

## 2020-07-26 DIAGNOSIS — M170 Bilateral primary osteoarthritis of knee: Secondary | ICD-10-CM | POA: Diagnosis not present

## 2020-07-26 DIAGNOSIS — M160 Bilateral primary osteoarthritis of hip: Secondary | ICD-10-CM | POA: Diagnosis not present

## 2020-08-03 ENCOUNTER — Other Ambulatory Visit: Payer: Self-pay

## 2020-08-04 ENCOUNTER — Encounter: Payer: Self-pay | Admitting: Internal Medicine

## 2020-08-04 ENCOUNTER — Ambulatory Visit (INDEPENDENT_AMBULATORY_CARE_PROVIDER_SITE_OTHER): Payer: Medicare Other | Admitting: Internal Medicine

## 2020-08-04 VITALS — BP 110/80 | HR 97 | Temp 98.5°F | Wt 234.4 lb

## 2020-08-04 DIAGNOSIS — E785 Hyperlipidemia, unspecified: Secondary | ICD-10-CM

## 2020-08-04 DIAGNOSIS — I10 Essential (primary) hypertension: Secondary | ICD-10-CM | POA: Diagnosis not present

## 2020-08-04 DIAGNOSIS — E1165 Type 2 diabetes mellitus with hyperglycemia: Secondary | ICD-10-CM | POA: Diagnosis not present

## 2020-08-04 LAB — POCT GLYCOSYLATED HEMOGLOBIN (HGB A1C): Hemoglobin A1C: 7.5 % — AB (ref 4.0–5.6)

## 2020-08-04 MED ORDER — CONTOUR NEXT TEST VI STRP: ORAL_STRIP | 3 refills | Status: DC

## 2020-08-04 NOTE — Progress Notes (Signed)
Established Patient Office Visit     This visit occurred during the SARS-CoV-2 public health emergency.  Safety protocols were in place, including screening questions prior to the visit, additional usage of staff PPE, and extensive cleaning of exam room while observing appropriate contact time as indicated for disinfecting solutions.    CC/Reason for Visit: Follow-up chronic medical conditions  HPI: Brenda Obrien is a 55 y.o. female who is coming in today for the above mentioned reasons. Past Medical History is significant for: Uncontrolled type 2 diabetes, hypertension, morbid obesity, hyperlipidemia, GERD, she also has multiple joint osteoarthritis and degenerative disc disease of the lumbar spine.  At last visit we added Trulicity to her diabetic regimen due to an A1c greater than 10.  She is still having some mild nausea for 1 to 2 days after injection days but other than that has been tolerating it relatively well.  She admits that she could still work on improving her eating habits.  She quit smoking last week.  She is very happy about this.  She was told by her orthopedist that she may need a left hip replacement soon.   Past Medical/Surgical History: Past Medical History:  Diagnosis Date  . Asthma   . Cataract   . Depression   . Diabetes mellitus, type 2 ([place]) 02/2018  . Elevated cholesterol   . Endometriosis   . Generalized anxiety disorder 2017  . GERD (gastroesophageal reflux disease)   . Glaucoma   . 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 Hypertension   . PTSD (post-traumatic stress disorder) 2017    Past Surgical History:  Procedure Laterality Date  . ANTERIOR CRUCIATE LIGAMENT REPAIR  1987,2001,2003,2005  . BACK SURGERY  01/2017   due herniated disc  . CERVICAL FUSION  10/21/2018  . EYE SURGERY Bilateral 2016   release pressure   . LAPAROSCOPIC CHOLECYSTECTOMY  5/14   [place], [place]  . LAPAROSCOPIC TOTAL HYSTERECTOMY  6/11  . left knee surgery  09/2011  . NASAL SINUS  SURGERY  1992  . REPLACEMENT TOTAL KNEE Left 9/14   Dr. Laurance Flatten  . SHOULDER SURGERY Right 09/08/2015   due to injury   . SHOULDER SURGERY Right 03/20/2016   due to injury  . SHOULDER SURGERY Left 09/10/2017   due rotator cuff    Social History:  reports that she has been smoking cigarettes. She has been smoking about 1.00 pack per day. She has never used smokeless tobacco. She reports current alcohol use of about 14.0 - 21.0 standard drinks of alcohol per week. She reports that she does not use drugs.  Allergies: No Known Allergies  Family History:  Family History  Problem Relation Age of Onset  . Coronary artery disease Mother        PAD  . Heart attack Mother 8  . Diabetes Mother   . Lung cancer Father   . Heart attack Father 14  . Hypertension Brother   . High Cholesterol Brother      Current Outpatient Medications:  .  Blood Glucose Monitoring Suppl (CONTOUR MONITOR) w/Device KIT, 1 Device by Does not apply route daily. Patient is to test once daily. Dx E11.9, Disp: 1 kit, Rfl: 1 .  diclofenac sodium (VOLTAREN) 1 % GEL, Apply 3 gm to 3 large joints up to 3 times a day.Dispense 3 tubes with 3 refills., Disp: 5 Tube, Rfl: 0 .  Dulaglutide (TRULICITY) 3.41 DQ/2.2WL SOPN, Inject 0.75 mg into the skin once a week., Disp:  2 mL, Rfl: 2 .  lisinopril (ZESTRIL) 10 MG tablet, Take 1 tablet (10 mg total) by mouth daily., Disp: 90 tablet, Rfl: 1 .  metFORMIN (GLUCOPHAGE) 1000 MG tablet, TAKE 1 TABLET (1,000 MG TOTAL) BY MOUTH 2 (TWO) TIMES DAILY WITH A MEAL., Disp: 180 tablet, Rfl: 1 .  Microlet Lancets MISC, USE AS DIRECTED EVERY DAY, Disp: 100 each, Rfl: 11 .  nebivolol (BYSTOLIC) 5 MG tablet, TAKE 1 TABLET BY MOUTH EVERY DAY (DISCONTINUE 2.5MG), Disp: 90 tablet, Rfl: 2 .  omeprazole (PRILOSEC) 40 MG capsule, TAKE 1 CAPSULE BY MOUTH EVERY DAY, Disp: 90 capsule, Rfl: 1 .  simvastatin (ZOCOR) 40 MG tablet, TAKE 1 TABLET BY MOUTH EVERY DAY, Disp: 90 tablet, Rfl: 0 .  timolol  (BETIMOL) 0.25 % ophthalmic solution, 1-2 drops 2 (two) times daily., Disp: , Rfl:  .  glucose blood (CONTOUR NEXT TEST) test strip, Use to check sugar daily Dx E11.9, Disp: 100 strip, Rfl: 3 .  scopolamine (TRANSDERM-SCOP) 1 MG/3DAYS, Place 1 patch (1.5 mg total) onto the skin every 3 (three) days. (Patient not taking: Reported on 08/04/2020), Disp: 4 patch, Rfl: 2  Review of Systems:  Constitutional: Denies fever, chills, diaphoresis, appetite change and fatigue.  HEENT: Denies photophobia, eye pain, redness, hearing loss, ear pain, congestion, sore throat, rhinorrhea, sneezing, mouth sores, trouble swallowing, neck pain, neck stiffness and tinnitus.   Respiratory: Denies SOB, DOE, cough, chest tightness,  and wheezing.   Cardiovascular: Denies chest pain, palpitations and leg swelling.  Gastrointestinal: Denies nausea, vomiting, abdominal pain, diarrhea, constipation, blood in stool and abdominal distention.  Genitourinary: Denies dysuria, urgency, frequency, hematuria, flank pain and difficulty urinating.  Endocrine: Denies: hot or cold intolerance, sweats, changes in hair or nails, polyuria, polydipsia. Musculoskeletal: Denies  joint swelling. Skin: Denies pallor, rash and wound.  Neurological: Denies dizziness, seizures, syncope, weakness, light-headedness, numbness and headaches.  Hematological: Denies adenopathy. Easy bruising, personal or family bleeding history  Psychiatric/Behavioral: Denies suicidal ideation, mood changes, confusion, nervousness, sleep disturbance and agitation    Physical Exam: Vitals:   08/04/20 0833  BP: 110/80  Pulse: 97  Temp: 98.5 F (36.9 C)  TempSrc: Oral  SpO2: 97%  Weight: 234 lb 6.4 oz (106.3 kg)    Body mass index is 37.83 kg/m.   Constitutional: NAD, calm, comfortable Eyes: PERRL, lids and conjunctivae normal ENMT: Mucous membranes are moist. Respiratory: clear to auscultation bilaterally, no wheezing, no crackles. Normal respiratory  effort. No accessory muscle use.  Cardiovascular: Regular rate and rhythm, no murmurs / rubs / gallops. No extremity edema.  Neurologic: Grossly intact and nonfocal Psychiatric: Normal judgment and insight. Alert and oriented x 3. Normal mood.    Impression and Plan:  Uncontrolled type 2 diabetes mellitus with hyperglycemia ([place])  -A1c has shown remarkable improvement over the last 3 months going from 10.1-7.5.  Continue maximum dose metformin and low-dose Trulicity.  Continue to work on eating habits.  Dyslipidemia -Continue daily statin therapy.  Primary hypertension -Blood pressures well controlled on current regimen of lisinopril 10 mg, Bystolic daily.   Lelon Frohlich, MD [facility]

## 2020-08-07 ENCOUNTER — Other Ambulatory Visit: Payer: Self-pay | Admitting: Internal Medicine

## 2020-08-07 DIAGNOSIS — E1165 Type 2 diabetes mellitus with hyperglycemia: Secondary | ICD-10-CM

## 2020-09-18 DIAGNOSIS — M160 Bilateral primary osteoarthritis of hip: Secondary | ICD-10-CM | POA: Diagnosis not present

## 2020-09-18 DIAGNOSIS — M25561 Pain in right knee: Secondary | ICD-10-CM | POA: Diagnosis not present

## 2020-09-18 DIAGNOSIS — M79651 Pain in right thigh: Secondary | ICD-10-CM | POA: Diagnosis not present

## 2020-09-18 DIAGNOSIS — M1711 Unilateral primary osteoarthritis, right knee: Secondary | ICD-10-CM | POA: Diagnosis not present

## 2020-09-18 DIAGNOSIS — Z96652 Presence of left artificial knee joint: Secondary | ICD-10-CM | POA: Diagnosis not present

## 2020-09-19 ENCOUNTER — Other Ambulatory Visit: Payer: Self-pay | Admitting: Internal Medicine

## 2020-09-22 DIAGNOSIS — M94261 Chondromalacia, right knee: Secondary | ICD-10-CM | POA: Diagnosis not present

## 2020-09-22 DIAGNOSIS — S83231A Complex tear of medial meniscus, current injury, right knee, initial encounter: Secondary | ICD-10-CM | POA: Diagnosis not present

## 2020-09-25 ENCOUNTER — Other Ambulatory Visit: Payer: Self-pay | Admitting: Internal Medicine

## 2020-09-28 ENCOUNTER — Other Ambulatory Visit: Payer: Self-pay | Admitting: Internal Medicine

## 2020-09-28 DIAGNOSIS — E1165 Type 2 diabetes mellitus with hyperglycemia: Secondary | ICD-10-CM

## 2020-09-28 DIAGNOSIS — K219 Gastro-esophageal reflux disease without esophagitis: Secondary | ICD-10-CM

## 2020-10-05 DIAGNOSIS — M65861 Other synovitis and tenosynovitis, right lower leg: Secondary | ICD-10-CM | POA: Diagnosis not present

## 2020-10-05 DIAGNOSIS — S83241A Other tear of medial meniscus, current injury, right knee, initial encounter: Secondary | ICD-10-CM | POA: Diagnosis not present

## 2020-10-05 DIAGNOSIS — S83231A Complex tear of medial meniscus, current injury, right knee, initial encounter: Secondary | ICD-10-CM | POA: Diagnosis not present

## 2020-10-05 DIAGNOSIS — M94261 Chondromalacia, right knee: Secondary | ICD-10-CM | POA: Diagnosis not present

## 2020-10-13 DIAGNOSIS — M25461 Effusion, right knee: Secondary | ICD-10-CM | POA: Diagnosis not present

## 2020-10-13 DIAGNOSIS — Z4789 Encounter for other orthopedic aftercare: Secondary | ICD-10-CM | POA: Diagnosis not present

## 2020-10-18 DIAGNOSIS — M6281 Muscle weakness (generalized): Secondary | ICD-10-CM | POA: Diagnosis not present

## 2020-10-18 DIAGNOSIS — M25561 Pain in right knee: Secondary | ICD-10-CM | POA: Diagnosis not present

## 2020-10-18 DIAGNOSIS — S83241D Other tear of medial meniscus, current injury, right knee, subsequent encounter: Secondary | ICD-10-CM | POA: Diagnosis not present

## 2020-10-18 DIAGNOSIS — M25661 Stiffness of right knee, not elsewhere classified: Secondary | ICD-10-CM | POA: Diagnosis not present

## 2020-10-24 DIAGNOSIS — M25561 Pain in right knee: Secondary | ICD-10-CM | POA: Diagnosis not present

## 2020-10-24 DIAGNOSIS — M25661 Stiffness of right knee, not elsewhere classified: Secondary | ICD-10-CM | POA: Diagnosis not present

## 2020-10-24 DIAGNOSIS — S83241D Other tear of medial meniscus, current injury, right knee, subsequent encounter: Secondary | ICD-10-CM | POA: Diagnosis not present

## 2020-10-24 DIAGNOSIS — M6281 Muscle weakness (generalized): Secondary | ICD-10-CM | POA: Diagnosis not present

## 2020-10-26 DIAGNOSIS — S83241D Other tear of medial meniscus, current injury, right knee, subsequent encounter: Secondary | ICD-10-CM | POA: Diagnosis not present

## 2020-10-26 DIAGNOSIS — M25561 Pain in right knee: Secondary | ICD-10-CM | POA: Diagnosis not present

## 2020-10-26 DIAGNOSIS — M25661 Stiffness of right knee, not elsewhere classified: Secondary | ICD-10-CM | POA: Diagnosis not present

## 2020-10-26 DIAGNOSIS — M6281 Muscle weakness (generalized): Secondary | ICD-10-CM | POA: Diagnosis not present

## 2020-10-28 ENCOUNTER — Other Ambulatory Visit: Payer: Self-pay | Admitting: Internal Medicine

## 2020-10-28 DIAGNOSIS — E1165 Type 2 diabetes mellitus with hyperglycemia: Secondary | ICD-10-CM

## 2020-10-30 DIAGNOSIS — M25661 Stiffness of right knee, not elsewhere classified: Secondary | ICD-10-CM | POA: Diagnosis not present

## 2020-10-30 DIAGNOSIS — S83241D Other tear of medial meniscus, current injury, right knee, subsequent encounter: Secondary | ICD-10-CM | POA: Diagnosis not present

## 2020-10-30 DIAGNOSIS — M6281 Muscle weakness (generalized): Secondary | ICD-10-CM | POA: Diagnosis not present

## 2020-10-30 DIAGNOSIS — M25561 Pain in right knee: Secondary | ICD-10-CM | POA: Diagnosis not present

## 2020-11-01 DIAGNOSIS — M25561 Pain in right knee: Secondary | ICD-10-CM | POA: Diagnosis not present

## 2020-11-01 DIAGNOSIS — M25661 Stiffness of right knee, not elsewhere classified: Secondary | ICD-10-CM | POA: Diagnosis not present

## 2020-11-01 DIAGNOSIS — M6281 Muscle weakness (generalized): Secondary | ICD-10-CM | POA: Diagnosis not present

## 2020-11-01 DIAGNOSIS — S83242D Other tear of medial meniscus, current injury, left knee, subsequent encounter: Secondary | ICD-10-CM | POA: Diagnosis not present

## 2020-11-02 ENCOUNTER — Other Ambulatory Visit: Payer: Self-pay

## 2020-11-03 ENCOUNTER — Other Ambulatory Visit: Payer: Self-pay | Admitting: Internal Medicine

## 2020-11-03 ENCOUNTER — Encounter: Payer: Self-pay | Admitting: Internal Medicine

## 2020-11-03 ENCOUNTER — Ambulatory Visit (INDEPENDENT_AMBULATORY_CARE_PROVIDER_SITE_OTHER): Payer: Medicare Other | Admitting: Internal Medicine

## 2020-11-03 VITALS — BP 120/84 | HR 97 | Temp 98.6°F | Wt 225.5 lb

## 2020-11-03 DIAGNOSIS — E1169 Type 2 diabetes mellitus with other specified complication: Secondary | ICD-10-CM | POA: Diagnosis not present

## 2020-11-03 DIAGNOSIS — I10 Essential (primary) hypertension: Secondary | ICD-10-CM

## 2020-11-03 DIAGNOSIS — E1165 Type 2 diabetes mellitus with hyperglycemia: Secondary | ICD-10-CM | POA: Diagnosis not present

## 2020-11-03 DIAGNOSIS — E782 Mixed hyperlipidemia: Secondary | ICD-10-CM

## 2020-11-03 DIAGNOSIS — E785 Hyperlipidemia, unspecified: Secondary | ICD-10-CM

## 2020-11-03 LAB — POCT GLYCOSYLATED HEMOGLOBIN (HGB A1C): Hemoglobin A1C: 6.6 % — AB (ref 4.0–5.6)

## 2020-11-03 LAB — LIPID PANEL
Cholesterol: 185 mg/dL (ref 0–200)
HDL: 48.1 mg/dL (ref >39.00)
LDL Cholesterol: 116 mg/dL — ABNORMAL HIGH (ref 0–99)
NonHDL: 137.23
Total CHOL/HDL Ratio: 4
Triglycerides: 106 mg/dL (ref 0.0–149.0)
VLDL: 21.2 mg/dL (ref 0.0–40.0)

## 2020-11-03 MED ORDER — ATORVASTATIN CALCIUM 80 MG PO TABS
80.0000 mg | ORAL_TABLET | Freq: Every day | ORAL | 1 refills | Status: DC
Start: 2020-11-03 — End: 2021-02-02

## 2020-11-03 NOTE — Addendum Note (Signed)
Addended by: Kandra Nicolas on: 11/03/2020 09:10 AM   Modules accepted: Orders

## 2020-11-03 NOTE — Patient Instructions (Signed)
-  Nice seeing you today!!  -Lab work today; will notify you once results are available.  -Schedule follow up in 3 months for your physical. Come in fasting.

## 2020-11-03 NOTE — Progress Notes (Signed)
Established Patient Office Visit     This visit occurred during the SARS-CoV-2 public health emergency.  Safety protocols were in place, including screening questions prior to the visit, additional usage of staff PPE, and extensive cleaning of exam room while observing appropriate contact time as indicated for disinfecting solutions.    CC/Reason for Visit: 41-monthfollow-up chronic medical conditions  HPI: Brenda TREVATHANis a 55y.o. female who is coming in today for the above mentioned reasons. Past Medical History is significant for: Type 2 diabetes, hypertension, morbid obesity, hyperlipidemia, GERD.  She is recovering from recent right knee arthroscopic surgery for meniscal injury.  She has been able to lose 10 pounds.  Her A1c had been uncontrolled 6 months ago at greater than 10.  At last visit it had dropped down to 7.5.  She is on maximal dose metformin and Trulicity 03.29mg injected weekly.  She is doing well and has no acute complaints.   Past Medical/Surgical History: Past Medical History:  Diagnosis Date   Asthma    Cataract    Depression    Diabetes mellitus, type 2 ([place] 02/2018   Elevated cholesterol    Endometriosis    Generalized anxiety disorder 2017   GERD (gastroesophageal reflux disease)    Glaucoma    HSV-1 infection 06/1998   Hypertension    PTSD (post-traumatic stress disorder) 2017    Past Surgical History:  Procedure Laterality Date   ANTERIOR CRUCIATE LIGAMENT REPAIR  1987,2001,2003,2005   BACK SURGERY  01/2017   due herniated disc   CERVICAL FUSION  10/21/2018   EYE SURGERY Bilateral 2016   release pressure    LAPAROSCOPIC [place]  5/14   [place]  LAPAROSCOPIC TOTAL HYSTERECTOMY  6/11   left knee surgery  09/2011   NASAL SINUS SURGERY  1992   REPLACEMENT TOTAL KNEE Left 9/14   Dr. DLaurance Flatten  SHOULDER SURGERY Right 09/08/2015   due to injury    SHOULDER SURGERY Right 03/20/2016   due to injury   SHOULDER SURGERY Left  09/10/2017   due rotator cuff    Social History:  reports that she has been smoking cigarettes. She has been smoking an average of 1 pack per day. She has never used smokeless tobacco. She reports current alcohol use of about 14.0 - 21.0 standard drinks of alcohol per week. She reports that she does not use drugs.  Allergies: No Known Allergies  Family History:  Family History  Problem Relation Age of Onset   Coronary artery disease Mother        PAD   Heart attack Mother 560  Diabetes Mother    Lung cancer Father    Heart attack Father 341  Hypertension Brother    High Cholesterol Brother      Current Outpatient Medications:    Blood Glucose Monitoring Suppl (CONTOUR MONITOR) w/Device KIT, 1 Device by Does not apply route daily. Patient is to test once daily. Dx E11.9, Disp: 1 kit, Rfl: 1   diclofenac sodium (VOLTAREN) 1 % GEL, Apply 3 gm to 3 large joints up to 3 times a day.Dispense 3 tubes with 3 refills., Disp: 5 Tube, Rfl: 0   glucose blood (CONTOUR NEXT TEST) test strip, Use to check sugar daily Dx E11.9, Disp: 100 strip, Rfl: 3   lisinopril (ZESTRIL) 10 MG tablet, TAKE 1 TABLET BY MOUTH EVERY DAY, Disp: 30 tablet, Rfl: 5   metFORMIN (GLUCOPHAGE) 1000 MG tablet,  TAKE 1 TABLET (1,000 MG TOTAL) BY MOUTH 2 (TWO) TIMES DAILY WITH A MEAL., Disp: 60 tablet, Rfl: 5   Microlet Lancets MISC, USE AS DIRECTED EVERY DAY, Disp: 100 each, Rfl: 11   nebivolol (BYSTOLIC) 5 MG tablet, TAKE 1 TABLET BY MOUTH EVERY DAY (DISCONTINUE 2.5MG), Disp: 30 tablet, Rfl: 2   omeprazole (PRILOSEC) 40 MG capsule, TAKE 1 CAPSULE BY MOUTH EVERY DAY, Disp: 30 capsule, Rfl: 5   scopolamine (TRANSDERM-SCOP) 1 MG/3DAYS, Place 1 patch (1.5 mg total) onto the skin every 3 (three) days., Disp: 4 patch, Rfl: 2   simvastatin (ZOCOR) 40 MG tablet, TAKE 1 TABLET BY MOUTH EVERY DAY, Disp: 30 tablet, Rfl: 2   timolol (BETIMOL) 0.25 % ophthalmic solution, 1-2 drops 2 (two) times daily., Disp: , Rfl:    TRULICITY 8.28  MK/3.4JZ SOPN, INJECT 0.75 MG INTO THE SKIN ONCE A WEEK., Disp: 6 mL, Rfl: 0  Review of Systems:  Constitutional: Denies fever, chills, diaphoresis, appetite change and fatigue.  HEENT: Denies photophobia, eye pain, redness, hearing loss, ear pain, congestion, sore throat, rhinorrhea, sneezing, mouth sores, trouble swallowing, neck pain, neck stiffness and tinnitus.   Respiratory: Denies SOB, DOE, cough, chest tightness,  and wheezing.   Cardiovascular: Denies chest pain, palpitations and leg swelling.  Gastrointestinal: Denies nausea, vomiting, abdominal pain, diarrhea, constipation, blood in stool and abdominal distention.  Genitourinary: Denies dysuria, urgency, frequency, hematuria, flank pain and difficulty urinating.  Endocrine: Denies: hot or cold intolerance, sweats, changes in hair or nails, polyuria, polydipsia. Musculoskeletal: Denies myalgias, back pain, joint swelling.  Skin: Denies pallor, rash and wound.  Neurological: Denies dizziness, seizures, syncope, weakness, light-headedness, numbness and headaches.  Hematological: Denies adenopathy. Easy bruising, personal or family bleeding history  Psychiatric/Behavioral: Denies suicidal ideation, mood changes, confusion, nervousness, sleep disturbance and agitation    Physical Exam: Vitals:   11/03/20 0827  BP: 120/84  Pulse: 97  Temp: 98.6 F (37 C)  TempSrc: Oral  SpO2: 98%  Weight: 225 lb 8 oz (102.3 kg)    Body mass index is 36.4 kg/m.   Constitutional: NAD, calm, comfortable Eyes: PERRL, lids and conjunctivae normal ENMT: Mucous membranes are moist.  Respiratory: clear to auscultation bilaterally, no wheezing, no crackles. Normal respiratory effort. No accessory muscle use.  Cardiovascular: Regular rate and rhythm, no murmurs / rubs / gallops. No extremity edema.  Neurologic: Grossly intact and nonfocal Psychiatric: Normal judgment and insight. Alert and oriented x 3. Normal mood.    Impression and  Plan:  Mixed hyperlipidemia  - Plan: Lipid panel -Last cholesterol check was above goal with a total cholesterol of 251, triglycerides 181 in LDL 170.  Type 2 diabetes mellitus with other specified complication, without long-term current use of insulin (HCC) -Significant improvement in A1c today down to 6.6.  Continue current medication.  Primary hypertension -Blood pressures well controlled.  Morbid obesity ([place]) -Discussed healthy lifestyle, including increased physical activity and better food choices to promote weight loss. -Congratulated on weight loss success thus far.   Patient Instructions  -Nice seeing you today!!  -Lab work today; will notify you once results are available.  -Schedule follow up in 3 months for your physical. Come in fasting.    Lelon Frohlich, MD [facility]

## 2020-11-06 ENCOUNTER — Other Ambulatory Visit: Payer: Self-pay

## 2020-11-06 ENCOUNTER — Ambulatory Visit (HOSPITAL_BASED_OUTPATIENT_CLINIC_OR_DEPARTMENT_OTHER): Payer: Medicare Other | Attending: Physician Assistant | Admitting: Physical Therapy

## 2020-11-06 ENCOUNTER — Encounter (HOSPITAL_BASED_OUTPATIENT_CLINIC_OR_DEPARTMENT_OTHER): Payer: Self-pay | Admitting: Physical Therapy

## 2020-11-06 DIAGNOSIS — R262 Difficulty in walking, not elsewhere classified: Secondary | ICD-10-CM | POA: Diagnosis not present

## 2020-11-06 DIAGNOSIS — G8929 Other chronic pain: Secondary | ICD-10-CM | POA: Diagnosis not present

## 2020-11-06 DIAGNOSIS — M25561 Pain in right knee: Secondary | ICD-10-CM | POA: Insufficient documentation

## 2020-11-06 DIAGNOSIS — M6281 Muscle weakness (generalized): Secondary | ICD-10-CM | POA: Diagnosis not present

## 2020-11-06 DIAGNOSIS — M25661 Stiffness of right knee, not elsewhere classified: Secondary | ICD-10-CM | POA: Diagnosis not present

## 2020-11-06 NOTE — Therapy (Signed)
[facility] [address] [place], [place], [address] Phone: [phone]   Fax:  [phone]  Physical Therapy Evaluation  Patient Details  Name: Brenda Obrien MRN: [id number] Date of Birth: [birth year] Referring Provider (PT): Milus Mallick, PA-C (Surgeon:  Dr. Shanon Rosser)   Encounter Date: 11/06/2020   PT End of Session - 11/06/20 1451     Visit Number 1    Number of Visits 12    Date for PT Re-Evaluation 12/04/20    Progress Note Due on Visit 10    PT Start Time 0847    PT Stop Time 0937    PT Time Calculation (min) 50 min    Activity Tolerance Patient tolerated treatment well    Behavior During Therapy [facility] for tasks assessed/performed             Past Medical History:  Diagnosis Date   Asthma    Cataract    Depression    Diabetes mellitus, type 2 (HCC) 02/2018   Elevated cholesterol    Endometriosis    Generalized anxiety disorder 2017   GERD (gastroesophageal reflux disease)    Glaucoma    HSV-1 infection 06/1998   Hypertension    PTSD (post-traumatic stress disorder) 2017    Past Surgical History:  Procedure Laterality Date   ANTERIOR CRUCIATE LIGAMENT REPAIR  1987,2001,2003,2005   BACK SURGERY  01/2017   due herniated disc   CERVICAL FUSION  10/21/2018   EYE SURGERY Bilateral 2016   release pressure    LAPAROSCOPIC CHOLECYSTECTOMY  5/14   [place], [place]   LAPAROSCOPIC TOTAL HYSTERECTOMY  6/11   left knee surgery  09/2011   NASAL SINUS SURGERY  1992   REPLACEMENT TOTAL KNEE Left 9/14   Dr. Elliot Dally   SHOULDER SURGERY Right 09/08/2015   due to injury    SHOULDER SURGERY Right 03/20/2016   due to injury   SHOULDER SURGERY Left 09/10/2017   due rotator cuff    There were no vitals filed for this visit.    Subjective Assessment - 11/06/20 0859     Subjective Pt had R knee scope with lateral release in 2011.  Pt states her knee was doing well until last December.  On 03/27/20, she bent her knee  and felt a pop having extreme pain while lying in bed.  Pt attempted PT though had increased pain in bilat hips and lumbar.  She continued to have pain in R knee and returned to MD.  Pt underwent R knee arthroscopic partial medial meniscectomy, chondroplasty, and major synovectomy on 10/05/20.  Pt attempted approx 2-3 weeks of PT and c/o's of having increased pain with land based therapy especially resistance exercises. pt states PT recommended she may benefit from aquatic PT.   Pt spoke with MD and MD agreed.  Pt reports that MD informed her she could get in the pool at 3 weeks post op.  MD script states please work on ROM and strength.  Aqua therapy if indicated.  Pt has difficulty with sit/stand transfers and bed mobility.  Pt is limited and painful with ambulation including grocery shopping.  Pt has difficulty performing stairs.  Pt has a hx of lumbar spasms which can happen if she walks too long.  Pt reports having ITB syndrome and Baker's cyst on R.    Pertinent History Hx of lumbar spasms.  ITB syndrome and Baker's cyst on R; Previous surgery including lateral release on R.  8 surgeries on L  knee including L TKA.  pt had a patella fx after TKA and has difficulty with stairs/incline due to L knee.  Lumbar DDD and L5-S1 Lumbar microdiscectomy on 01/27/17, anterior cervical discectomy and fusion C5-C6 10/21/18.  OA in bilat hips and she states she needs a THR on R.  Bilat shoulder RCR.  DM, HSV-1.    Limitations Sitting;Standing;Walking;House hold activities    How long can you sit comfortably? 30-45 mins in recliner with legs straight out; very limted with sitting with knees flexed    How long can you stand comfortably? 15-20 mins    How long can you walk comfortably? limited    Diagnostic tests X rays and MRI    Patient Stated Goals walk further, improve strength, reduce pain    Currently in Pain? Yes    Pain Score 4    Worst:  8-9/10, Best:  3/10   Pain Location Knee    Pain Orientation Right    Pain  Onset More than a month ago    Pain Frequency Constant    Aggravating Factors  sitting, walking, standing    Effect of Pain on Daily Activities walking, standing, sitting, mobility                [facility] PT Assessment - 11/06/20 0001       Assessment   Medical Diagnosis R knee arthroscopic partial medial meniscectomy, chondroplasty, and major synovectomy    Referring Provider (PT) Milus MallickEric Eck, PA-C   Surgeon:  Dr. Shanon RosserJohn Hubbard   Onset Date/Surgical Date 10/05/20    Hand Dominance Right    Next MD Visit 11/17/20    Prior Therapy yes      Precautions   Precautions --   bilat Hip OA, bilat knee pain, Anterior cervical fusion, Lumbar microdiscectomy.  Didn't respond well to land PT     Restrictions   Weight Bearing Restrictions No      Balance Screen   Has the patient fallen in the past 6 months No      Home Environment   Living Environment Private residence    Additional Comments 1 story townhome; 1 step to enter, no rail      Prior Function   Level of Independence Independent      Cognition   Overall Cognitive Status Within Functional Limits for tasks assessed      Observation/Other Assessments   Observations portals healed and closed      Observation/Other Assessments-Edema    Edema Circumferential      Circumferential Edema   Circumferential - Right knee: 43 cm    Circumferential - Left  knee: 41.2 cm      ROM / Strength   AROM / PROM / Strength AROM;PROM;Strength      AROM   AROM Assessment Site Knee    Right/Left Knee Right;Left    Right Knee Extension 1    Right Knee Flexion 109    Left Knee Extension 0    Left Knee Flexion 110      PROM   PROM Assessment Site Knee    Right/Left Knee Left;Right    Right Knee Extension 0    Right Knee Flexion 114      Strength   Overall Strength Comments Pt has a good quad set.  Pt able to perform a supine SLR independently with slight quad lag.  R knee extension:  4-/5 MMT.      Palpation   Palpation comment TTP:   med and  lateral knee.  none in calf      Ambulation/Gait   Ambulation/Gait Yes    Gait Comments limited heel strike, limited toe off with decreased knee flexion, antalgic limp                        Objective measurements completed on examination: See above findings.       [facility] Adult PT Treatment/Exercise - 11/06/20 0001       Exercises   Exercises Knee/Hip   Educated pt concerning POC.Answered pt's questions.  Reviewed current HEP and Instructed pt to cont with HEP.Pt has been performing quad set,supine SLR,heel slides,LAQ, and standing heel raises.Pt performed some HEP in clinic and PT instructed pt in form                   PT Education - 11/06/20 2214     Education Details Educated pt concerning POC and rehab expectations.  Answered pt's questions.  Reviewed current HEP and Instructed pt to continue with HEP.    Person(s) Educated Patient    Methods Explanation    Comprehension Verbalized understanding;Returned demonstration              PT Short Term Goals - 11/06/20 2220       PT SHORT TERM GOAL #1   Title Pt wil report worst pain to be no greater than 6-7/10 for improved tolerance to activity.    Baseline 8-9/10 pain    Time 3    Period Weeks    Status New    Target Date 11/27/20      PT SHORT TERM GOAL #2   Title Pt will demo improved heel strike upon initial contact and knee flexion with toe off to assist with normalizing gait.    Time 3    Period Weeks    Status New    Target Date 11/27/20      PT SHORT TERM GOAL #3   Title Pt will demo R knee extension AROM to 0 deg and supine SLR to be performed without quad lag for improved gait, stiffness, and strength.    Baseline 1 deg of knee extension AROM,  slight quad lag with supine SLR    Time 3    Period Weeks    Status New    Target Date 11/27/20      PT SHORT TERM GOAL #4   Title Pt will demo good tolerance with the combination of aquatic and land based exercises for  improved tolerance to activity, strength, and mobility.    Time 3    Period Weeks    Status New    Target Date 11/27/20      PT SHORT TERM GOAL #5   Title Pt will ambulate in the grocery store without significant R knee pain.    Time 3    Period Weeks    Status New    Target Date 11/27/20      Additional Short Term Goals   Additional Short Term Goals Yes      PT SHORT TERM GOAL #6   Title Pt will be able to perform 6 inch step ups with good stability and form for improved performance of ascending/descending curbs and steps.    Time 4    Period Weeks    Status New    Target Date 12/04/20               PT Long Term Goals -  11/06/20 2226       PT LONG TERM GOAL #1   Title Pt will be able to perform her normal daily transfers without significant R knee pain or difficulty.    Time 6    Period Weeks    Status New    Target Date 12/18/20      PT LONG TERM GOAL #2   Title Pt will ambulate extended community distance without significant R knee pain or limitation from R knee.    Time 6    Period Weeks    Status New    Target Date 12/18/20      PT LONG TERM GOAL #3   Title Pt will demo improved R hip flex and abd strength and knee flex and ext MMT strength to 5/5 for improved tolerance to functional mobility skills.    Time 6    Period Weeks    Status New    Target Date 12/18/20      PT LONG TERM GOAL #4   Title Pt will be independent and compliant with a HEP for improved pain, ROM, strength, function, and mobility.    Time 6    Period Weeks    Status New    Target Date 12/18/20      PT LONG TERM GOAL #5   Title Pt will demo improved standing tolerance and perform her normal household chores and cleaning without signifiant R knee pain or limitations from R knee.    Time 6    Period Weeks    Status New    Target Date 12/18/20                    Plan - 11/06/20 1406     Clinical Impression Statement Pt is a 54 y/o female 4 weeks and 4 days s/p R  knee arthroscopic partial medial meniscectomy, chondroplasty, and major synovectomy presenting to the clinic with R knee pain, muscle weakness in R LE, difficulty in walking, and limited ROM in R knee.  Pt has a hx of mutliple orthopedic conditions including bilat knee pain with 8 surgeries on L knee, lumbar spasms, bilat hip OA, anterior cervical discectomy and fusion C5-C6, and L5-S1 Lumbar microdiscectomy.  Pt also reports having ITB syndrome and Baker's cyst on R.  Pt didn't respond well to land based PT having increased pain and presents to Rx with recommendations for aquatic therapy.  Pt has increased pain and is limited with performing her daily activities and functional mobility skills including ambulation, transfers, bed mobility, and stairs.  Pt is also limited with sitting.  Pt should benefit from skilled PT services including a combination of aquatic and land based PT.    Personal Factors and Comorbidities Comorbidity 3+;Other   Increased pain with land based PT   Comorbidities Hx of lumbar spasms.  8 surgeries on L knee including L TKA and a patella fx.  Lumbar DDD and L5-S1 Lumbar microdiscectomy on 01/27/17,  OA in bilat hips and she states she needs a THR on R.  DM.    Examination-Activity Limitations Bed Mobility;Bend;Stand;Stairs;Squat;Sit;Locomotion Level;Transfers    Examination-Participation Restrictions Community Activity;Shop    Stability/Clinical Decision Making Evolving/Moderate complexity    Clinical Decision Making Moderate    Rehab Potential Good    PT Frequency 2x / week    PT Duration 6 weeks    PT Treatment/Interventions ADLs/Self Care Home Management;Aquatic Therapy;Cryotherapy;Electrical Stimulation;Moist Heat;Gait training;Stair training;Functional mobility training;Therapeutic activities;Therapeutic exercise;Balance training;Neuromuscular re-education;Manual techniques;Patient/family education  PT Next Visit Plan Perform light intensity land exercises per pt tolerance  next visit.  Give LEFS.  Begin Aquatic therapy next week. 1 more land Rx this week, 2 aquatic Rx's next week, and alternate aquatic/land the following week.    PT Home Exercise Plan Reviewed current HEP and Instructed pt to continue with HEP.    Consulted and Agree with Plan of Care Patient             Patient will benefit from skilled therapeutic intervention in order to improve the following deficits and impairments:  Decreased activity tolerance, Abnormal gait, Decreased mobility, Decreased endurance, Decreased range of motion, Decreased strength, Increased edema, Difficulty walking, Pain  Visit Diagnosis: Chronic pain of right knee - Plan: PT plan of care cert/re-cert  Muscle weakness (generalized) - Plan: PT plan of care cert/re-cert  Difficulty in walking, not elsewhere classified - Plan: PT plan of care cert/re-cert  Stiffness of right knee - Plan: PT plan of care cert/re-cert     Problem List Patient Active Problem List   Diagnosis Date Noted   Cardiac risk counseling 02/22/2019   Elevated liver function tests 02/22/2019   Well woman exam without gynecological exam 02/15/2019   Risk for coronary artery disease between 10% and 20% in next 10 years 02/15/2019   Cervical spondylosis with radiculopathy 08/24/2018   HLD (hyperlipidemia) 04/27/2018   Type 2 diabetes mellitus (HCC) 03/11/2018   Primary osteoarthritis of right knee 10/24/2017   Primary osteoarthritis of both hands 10/24/2017   Primary osteoarthritis of left hip 10/24/2017   DDD (degenerative disc disease), lumbar 11/18/2016   Severe episode of recurrent major depressive disorder, without psychotic features (HCC) 03/15/2016   MDD (major depressive disorder), recurrent episode, severe (HCC) 03/15/2016   Insomnia 12/27/2015   Adhesive capsulitis of right shoulder 12/21/2015   Obesity, unspecified 09/20/2013   Carpal tunnel syndrome 09/20/2013   Allergic rhinitis 09/20/2013   H/O total knee replacement  01/25/2013   Depression 12/23/2012   Anxiety 03/13/2012   Hypertension 01/21/2012   Anterior cruciate ligament complete tear 07/01/2011   Tachycardia 11/22/2010   Dyslipidemia 11/22/2010   GERD 03/27/2009    Audie Clear III PT, DPT 11/06/20 10:45 PM   [facility] [address] [place], [place], [address] Phone: [phone]   Fax:  [phone]  Name: Brenda Obrien MRN: [id number] Date of Birth: 1965-12-25

## 2020-11-08 ENCOUNTER — Ambulatory Visit (HOSPITAL_BASED_OUTPATIENT_CLINIC_OR_DEPARTMENT_OTHER): Payer: Medicare Other | Admitting: Physical Therapy

## 2020-11-08 ENCOUNTER — Encounter (HOSPITAL_BASED_OUTPATIENT_CLINIC_OR_DEPARTMENT_OTHER): Payer: Self-pay | Admitting: Physical Therapy

## 2020-11-08 ENCOUNTER — Other Ambulatory Visit (HOSPITAL_BASED_OUTPATIENT_CLINIC_OR_DEPARTMENT_OTHER): Payer: Self-pay | Admitting: Obstetrics & Gynecology

## 2020-11-08 ENCOUNTER — Encounter (HOSPITAL_BASED_OUTPATIENT_CLINIC_OR_DEPARTMENT_OTHER): Payer: Self-pay | Admitting: Obstetrics & Gynecology

## 2020-11-08 ENCOUNTER — Other Ambulatory Visit: Payer: Self-pay

## 2020-11-08 DIAGNOSIS — M25661 Stiffness of right knee, not elsewhere classified: Secondary | ICD-10-CM

## 2020-11-08 DIAGNOSIS — R262 Difficulty in walking, not elsewhere classified: Secondary | ICD-10-CM

## 2020-11-08 DIAGNOSIS — M6281 Muscle weakness (generalized): Secondary | ICD-10-CM

## 2020-11-08 DIAGNOSIS — G8929 Other chronic pain: Secondary | ICD-10-CM

## 2020-11-08 DIAGNOSIS — M25561 Pain in right knee: Secondary | ICD-10-CM | POA: Diagnosis not present

## 2020-11-08 DIAGNOSIS — Z1211 Encounter for screening for malignant neoplasm of colon: Secondary | ICD-10-CM

## 2020-11-08 NOTE — Therapy (Signed)
[facility] [address] [place], [place], [address] Phone: [phone]   Fax:  [phone]  Physical Therapy Treatment  Patient Details  Name: Brenda CroftsDonna L Obrien MRN: [id number] Date of Birth: 08/29/1965 Referring Provider (PT): Milus MallickEric Eck, PA-C (Surgeon:  Dr. Shanon RosserJohn Obrien)   Encounter Date: 11/08/2020   PT End of Session - 11/08/20 0854     Visit Number 2    Number of Visits 12    Date for PT Re-Evaluation 12/04/20    Progress Note Due on Visit 10    PT Start Time 0847    PT Stop Time 0929    PT Time Calculation (min) 42 min    Activity Tolerance Patient tolerated treatment well    Behavior [facility] for tasks assessed/performed             Past Medical History:  Diagnosis Date   Asthma    Cataract    Depression    Diabetes mellitus, type 2 (HCC) 02/2018   Elevated cholesterol    Endometriosis    Generalized anxiety disorder 2017   GERD (gastroesophageal reflux disease)    Glaucoma    HSV-1 infection 06/1998   Hypertension    PTSD (post-traumatic stress disorder) 2017    Past Surgical History:  Procedure Laterality Date   ANTERIOR CRUCIATE LIGAMENT REPAIR  1987,2001,2003,2005   BACK SURGERY  01/2017   due herniated disc   CERVICAL FUSION  10/21/2018   EYE SURGERY Bilateral 2016   release pressure    LAPAROSCOPIC CHOLE[place]  5/[place]   LAPAROSCOPIC TOTAL HYSTERECTOMY  6/11   left knee surgery  09/2011   NASAL SINUS SURGERY  1992   REPLACEMENT TOTAL KNEE Left 9/14   Dr. Elliot Dallyavid Martin   SHOULDER SURGERY Right 09/08/2015   due to injury    SHOULDER SURGERY Right 03/20/2016   due to injury   SHOULDER SURGERY Left 09/10/2017   due rotator cuff    There were no vitals filed for this visit.   Subjective Assessment - 11/08/20 0848     Subjective Pt states she had a bad night sleeping last night not only due to her knee but also her hips, L knee, and lumbar.  Pt has increased R knee  pain with sitting, standing, and ambulation.  She is limited with ambulation distance and has difficulty with bed mobility and sit/stand transfers.  Pt states she she had increased bilat knee pain after prior Rx/eval.    Pertinent History Hx of lumbar spasms.  ITB syndrome and Baker's cyst on R; Previous surgery including lateral release on R.  8 surgeries on L knee including L TKA.  pt had a patella fx after TKA and has difficulty with stairs/incline due to L knee.  Lumbar DDD and L5-S1 Lumbar microdiscectomy on 01/27/17, anterior cervical discectomy and fusion C5-C6 10/21/18.  OA in bilat hips and she states she needs a THR on R.  Bilat shoulder RCR.  DM, HSV-1.    Limitations Sitting;Standing;Walking;House hold activities    How long can you sit comfortably? 30-45 mins in recliner with legs straight out; very limted with sitting with knees flexed    How long can you stand comfortably? 15-20 mins    How long can you walk comfortably? limited    Patient Stated Goals walk further, improve strength, reduce pain    Currently in Pain? Yes    Pain Score 5     Pain Location Knee  Pain Orientation Right    Effect of Pain on Daily Activities walking, standing, sitting, mobility                [facility] PT Assessment - 11/08/20 0001       AROM   AROM Assessment Site Knee    Right/Left Knee Right    Right Knee Extension 0    Right Knee Flexion 113                           [facility] Adult PT Treatment/Exercise - 11/08/20 0001       Exercises   Exercises Knee/Hip   Educated pt concerning POC.Answered pt's questions.  Reviewed current HEP and performed HEP. Reviewed current function, pain level, and response to prior Rx.     Knee/Hip Exercises: Standing   Other Standing Knee Exercises TKE with RTB 2x10, marching on foam with UE assist 2x10 reps      Knee/Hip Exercises: Seated   Long Arc Quad Right;2 sets;10 reps      Knee/Hip Exercises: Supine   Short Arc Quad Sets 2 sets;10  reps;Right    Straight Leg Raises 1 set;10 reps;Right    Other Supine Knee/Hip Exercises supine heel slides x 10 reps and with strap x 10 reps      Manual Therapy   Manual Therapy Joint mobilization;Passive ROM    Manual therapy comments Pt is very tender to palpate mid ITB and has minimal tenderness at distal ITB.  Pt reported lateral knee pain with lateral patellar glide    Joint Mobilization Sup/inf patellar mobs    Passive ROM Knee flexion and extension PROM in longsitting to restore functional ROM.                    PT Education - 11/08/20 1344     Education Details Educated pt concerning POC.  Reviewed current HEP and performed HEP.  Educated pt with AROM findings and compared to prior Rx.  Instructed pt in using ice if she has increased pain later.    Person(s) Educated Patient    Methods Explanation;Demonstration;Verbal cues    Comprehension Returned demonstration;Verbalized understanding              PT Short Term Goals - 11/06/20 2220       PT SHORT TERM GOAL #1   Title Pt wil report worst pain to be no greater than 6-7/10 for improved tolerance to activity.    Baseline 8-9/10 pain    Time 3    Period Weeks    Status New    Target Date 11/27/20      PT SHORT TERM GOAL #2   Title Pt will demo improved heel strike upon initial contact and knee flexion with toe off to assist with normalizing gait.    Time 3    Period Weeks    Status New    Target Date 11/27/20      PT SHORT TERM GOAL #3   Title Pt will demo R knee extension AROM to 0 deg and supine SLR to be performed without quad lag for improved gait, stiffness, and strength.    Baseline 1 deg of knee extension AROM,  slight quad lag with supine SLR    Time 3    Period Weeks    Status New    Target Date 11/27/20      PT SHORT TERM GOAL #4   Title Pt will  demo good tolerance with the combination of aquatic and land based exercises for improved tolerance to activity, strength, and mobility.     Time 3    Period Weeks    Status New    Target Date 11/27/20      PT SHORT TERM GOAL #5   Title Pt will ambulate in the grocery store without significant R knee pain.    Time 3    Period Weeks    Status New    Target Date 11/27/20      Additional Short Term Goals   Additional Short Term Goals Yes      PT SHORT TERM GOAL #6   Title Pt will be able to perform 6 inch step ups with good stability and form for improved performance of ascending/descending curbs and steps.    Time 4    Period Weeks    Status New    Target Date 12/04/20               PT Long Term Goals - 11/06/20 2226       PT LONG TERM GOAL #1   Title Pt will be able to perform her normal daily transfers without significant R knee pain or difficulty.    Time 6    Period Weeks    Status New    Target Date 12/18/20      PT LONG TERM GOAL #2   Title Pt will ambulate extended community distance without significant R knee pain or limitation from R knee.    Time 6    Period Weeks    Status New    Target Date 12/18/20      PT LONG TERM GOAL #3   Title Pt will demo improved R hip flex and abd strength and knee flex and ext MMT strength to 5/5 for improved tolerance to functional mobility skills.    Time 6    Period Weeks    Status New    Target Date 12/18/20      PT LONG TERM GOAL #4   Title Pt will be independent and compliant with a HEP for improved pain, ROM, strength, function, and mobility.    Time 6    Period Weeks    Status New    Target Date 12/18/20      PT LONG TERM GOAL #5   Title Pt will demo improved standing tolerance and perform her normal household chores and cleaning without signifiant R knee pain or limitations from R knee.    Time 6    Period Weeks    Status New    Target Date 12/18/20                   Plan - 11/08/20 0931     Clinical Impression Statement Pt seen on land today.  She is limited with the time she can lie supine due to her back pain.  Pt had seated  breaks and performed some exercises and manual therapy in longsitting.  Pt demonstrates improved flex and ext AROM today including achieving  0 deg of extension.  Pt performed exercises well with cuing for correct form.  PT did not have pt perform any resisted exercises except TKE due to pt's prior response to resisted exercises in her prior land based PT at the other clinic.  Pt responded well to Rx reporting no change in pain after Rx.  pt's Rx will be affected by her other areas of pain including  bilat hips, lumbar, and L knee.    Comorbidities Hx of lumbar spasms.  8 surgeries on L knee including L TKA and a patella fx.  Lumbar DDD and L5-S1 Lumbar microdiscectomy on 01/27/17,  OA in bilat hips and she states she needs a THR on R.  DM.    PT Treatment/Interventions ADLs/Self Care Home Management;Aquatic Therapy;Cryotherapy;Electrical Stimulation;Moist Heat;Gait training;Stair training;Functional mobility training;Therapeutic activities;Therapeutic exercise;Balance training;Neuromuscular re-education;Manual techniques;Patient/family education    PT Next Visit Plan Give LEFS.  Begin Aquatic therapy next week. 2 aquatic Rx's next week, and alternate aquatic/land the following week.    PT Home Exercise Plan Reviewed current HEP and Instructed pt to continue with HEP.    Consulted and Agree with Plan of Care Patient             Patient will benefit from skilled therapeutic intervention in order to improve the following deficits and impairments:  Decreased activity tolerance, Abnormal gait, Decreased mobility, Decreased endurance, Decreased range of motion, Decreased strength, Increased edema, Difficulty walking, Pain  Visit Diagnosis: Chronic pain of right knee  Muscle weakness (generalized)  Difficulty in walking, not elsewhere classified  Stiffness of right knee     Problem List Patient Active Problem List   Diagnosis Date Noted   Cardiac risk counseling 02/22/2019   Elevated liver  function tests 02/22/2019   Well woman exam without gynecological exam 02/15/2019   Risk for coronary artery disease between 10% and 20% in next 10 years 02/15/2019   Cervical spondylosis with radiculopathy 08/24/2018   HLD (hyperlipidemia) 04/27/2018   Type 2 diabetes mellitus (HCC) 03/11/2018   Primary osteoarthritis of right knee 10/24/2017   Primary osteoarthritis of both hands 10/24/2017   Primary osteoarthritis of left hip 10/24/2017   DDD (degenerative disc disease), lumbar 11/18/2016   Severe episode of recurrent major depressive disorder, without psychotic features (HCC) 03/15/2016   MDD (major depressive disorder), recurrent episode, severe (HCC) 03/15/2016   Insomnia 12/27/2015   Adhesive capsulitis of right shoulder 12/21/2015   Obesity, unspecified 09/20/2013   Carpal tunnel syndrome 09/20/2013   Allergic rhinitis 09/20/2013   H/O total knee replacement 01/25/2013   Depression 12/23/2012   Anxiety 03/13/2012   Hypertension 01/21/2012   Anterior cruciate ligament complete tear 07/01/2011   Tachycardia 11/22/2010   Dyslipidemia 11/22/2010   GERD 03/27/2009    Audie Clear III PT, DPT 11/08/20 2:03 PM   [facility] [address] [place], [place], [address] Phone: [phone]   Fax:  [phone]  Name: ELVIN BANKER MRN: [id number] Date of Birth: 17-Mar-1966

## 2020-11-14 ENCOUNTER — Other Ambulatory Visit: Payer: Self-pay

## 2020-11-14 ENCOUNTER — Encounter (HOSPITAL_BASED_OUTPATIENT_CLINIC_OR_DEPARTMENT_OTHER): Payer: Self-pay | Admitting: Physical Therapy

## 2020-11-14 ENCOUNTER — Ambulatory Visit (HOSPITAL_BASED_OUTPATIENT_CLINIC_OR_DEPARTMENT_OTHER): Payer: Medicare Other | Attending: Physician Assistant | Admitting: Physical Therapy

## 2020-11-14 DIAGNOSIS — M25661 Stiffness of right knee, not elsewhere classified: Secondary | ICD-10-CM | POA: Insufficient documentation

## 2020-11-14 DIAGNOSIS — M6281 Muscle weakness (generalized): Secondary | ICD-10-CM | POA: Diagnosis not present

## 2020-11-14 DIAGNOSIS — G8929 Other chronic pain: Secondary | ICD-10-CM | POA: Diagnosis not present

## 2020-11-14 DIAGNOSIS — M25561 Pain in right knee: Secondary | ICD-10-CM | POA: Diagnosis not present

## 2020-11-14 DIAGNOSIS — R262 Difficulty in walking, not elsewhere classified: Secondary | ICD-10-CM | POA: Insufficient documentation

## 2020-11-15 NOTE — Therapy (Signed)
[facility] [address] [place], [place], [address] Phone: [phone]   Fax:  [phone]  Physical Therapy Treatment  Patient Details  Name: Brenda Obrien MRN: [id number] Date of Birth: 04-Feb-1966 Referring Provider (PT): Milus Mallick, PA-C (Surgeon:  Dr. Shanon Rosser)   Encounter Date: 11/14/2020   PT End of Session - 11/14/20 2358     Visit Number 3    Number of Visits 12    Date for PT Re-Evaluation 12/04/20    Progress Note Due on Visit 10    PT Start Time 1158    PT Stop Time 1243    PT Time Calculation (min) 45 min    Activity Tolerance Patient limited by pain    Behavior During Therapy [facility] for tasks assessed/performed             Past Medical History:  Diagnosis Date   Asthma    Cataract    Depression    Diabetes mellitus, type 2 (HCC) 02/2018   Elevated cholesterol    Endometriosis    Generalized anxiety disorder 2017   GERD (gastroesophageal reflux disease)    Glaucoma    HSV-1 infection 06/1998   Hypertension    PTSD (post-traumatic stress disorder) 2017    Past Surgical History:  Procedure Laterality Date   ANTERIOR CRUCIATE LIGAMENT REPAIR  1987,2001,2003,2005   BACK SURGERY  01/2017   due herniated disc   CERVICAL FUSION  10/21/2018   EYE SURGERY Bilateral 2016   release pressure    LAPAROSCOPIC CHOLECYSTECTOMY  5/14   [place], [place]   LAPAROSCOPIC TOTAL HYSTERECTOMY  6/11   left knee surgery  09/2011   NASAL SINUS SURGERY  1992   REPLACEMENT TOTAL KNEE Left 9/14   Dr. Elliot Dally   SHOULDER SURGERY Right 09/08/2015   due to injury    SHOULDER SURGERY Right 03/20/2016   due to injury   SHOULDER SURGERY Left 09/10/2017   due rotator cuff    There were no vitals filed for this visit.   Subjective Assessment - 11/14/20 1158     Subjective pt reports she had a stabbing pain in anteromedial knee and post knee immediately after prior Rx before she got to her car.  Pt used ice when she  returned home.  Pt states the ITB and Baker's cyst were super inflamed after prior Rx which lasted for a couple of days.  pt states she still has some residual pain though was pretty rough up Thurs, Fri, and Sat.  Pt states her limp has come back.  Pt has increased pain with sit/stand transfers and ambulation.  She has a lot of pain with stairs but has had pain for a long time with stairs.  Pt c/o's of popping in medial knee.    Pertinent History Hx of lumbar spasms.  ITB syndrome and Baker's cyst on R; Previous surgery including lateral release on R.  8 surgeries on L knee including L TKA.  pt had a patella fx after TKA and has difficulty with stairs/incline due to L knee.  Lumbar DDD and L5-S1 Lumbar microdiscectomy on 01/27/17, anterior cervical discectomy and fusion C5-C6 10/21/18.  OA in bilat hips and she states she needs a THR on R.  Bilat shoulder RCR.  DM, HSV-1.    Limitations Sitting;Standing;Walking;House hold activities    Currently in Pain? Yes    Pain Score 5     Pain Location Knee   Knee, ITB             -  Pt seen for aquatic therapy today.  Treatment took place in water 3.5-4.5 ft in depth at the [facility] pool. Temp of water was 91.  Pt entered/exited the pool via stairs with step to gait pattern with bilat rail. -Reviewed response to prior Rx, current function, HEP compliance, and pain level. -Introduction to water.  PT had Pt stand at different depths of water in order to feel the buoyancy and how it relates to her sx's.     -Pt ambulated fwd approx 4 laps and ambulated bwd approx 3 laps with verbal and visual instruction.   -Pt performed:     -marching 2 x 10 reps with bilat UE assist on pool edge.     -Standing Hip flex and extension x 10 reps each bilat and standing heel raises 2x10 reps with UE assist on pool edge.      -Seated bicycles 2x20 reps and seated flutter kicks 2x10 reps.     -Seated on bench allowing LE's to float passively for stretch, reduced  stiffness, and relaxation of mm.    Pt requires buoyancy for support and to offload joints with strengthening exercises. Viscosity of the water is needed for resistance of strengthening; water current perturbations provides challenge to standing balance unsupported, requiring increased core activation.        PT Education - 11/14/20 2357     Education Details Educated pt concerning POC and answered Pt's questions.  Educated pt on the properties and benefits of aquatic therapy including buoyance and viscosity for improved core and LE strength, balance, and reduced stress on joints.  instructed pt in correct form with ex's.    Person(s) Educated Patient    Methods Explanation;Demonstration;Verbal cues    Comprehension Returned demonstration;Verbalized understanding              PT Short Term Goals - 11/06/20 2220       PT SHORT TERM GOAL #1   Title Pt wil report worst pain to be no greater than 6-7/10 for improved tolerance to activity.    Baseline 8-9/10 pain    Time 3    Period Weeks    Status New    Target Date 11/27/20      PT SHORT TERM GOAL #2   Title Pt will demo improved heel strike upon initial contact and knee flexion with toe off to assist with normalizing gait.    Time 3    Period Weeks    Status New    Target Date 11/27/20      PT SHORT TERM GOAL #3   Title Pt will demo R knee extension AROM to 0 deg and supine SLR to be performed without quad lag for improved gait, stiffness, and strength.    Baseline 1 deg of knee extension AROM,  slight quad lag with supine SLR    Time 3    Period Weeks    Status New    Target Date 11/27/20      PT SHORT TERM GOAL #4   Title Pt will demo good tolerance with the combination of aquatic and land based exercises for improved tolerance to activity, strength, and mobility.    Time 3    Period Weeks    Status New    Target Date 11/27/20      PT SHORT TERM GOAL #5   Title Pt will ambulate in the grocery store without  significant R knee pain.    Time 3    Period Weeks  Status New    Target Date 11/27/20      Additional Short Term Goals   Additional Short Term Goals Yes      PT SHORT TERM GOAL #6   Title Pt will be able to perform 6 inch step ups with good stability and form for improved performance of ascending/descending curbs and steps.    Time 4    Period Weeks    Status New    Target Date 12/04/20               PT Long Term Goals - 11/06/20 2226       PT LONG TERM GOAL #1   Title Pt will be able to perform her normal daily transfers without significant R knee pain or difficulty.    Time 6    Period Weeks    Status New    Target Date 12/18/20      PT LONG TERM GOAL #2   Title Pt will ambulate extended community distance without significant R knee pain or limitation from R knee.    Time 6    Period Weeks    Status New    Target Date 12/18/20      PT LONG TERM GOAL #3   Title Pt will demo improved R hip flex and abd strength and knee flex and ext MMT strength to 5/5 for improved tolerance to functional mobility skills.    Time 6    Period Weeks    Status New    Target Date 12/18/20      PT LONG TERM GOAL #4   Title Pt will be independent and compliant with a HEP for improved pain, ROM, strength, function, and mobility.    Time 6    Period Weeks    Status New    Target Date 12/18/20      PT LONG TERM GOAL #5   Title Pt will demo improved standing tolerance and perform her normal household chores and cleaning without signifiant R knee pain or limitations from R knee.    Time 6    Period Weeks    Status New    Target Date 12/18/20                   Plan - 11/14/20 1207     Clinical Impression Statement Pt is 5 weeks and 5 days post op.  Pt attended land therapy last visit and reports significantly increased knee pain.  Pt had c/o's of R knee pain t/o Rx today in the pool and was limited with exercises and activities in the pool due to pain.  She c/o'd of   medial knee pain with turning with gait in the pool and reports popping in knee t/o Rx.  pt states she felt good sitting on pool bench allowing her legs to passively float.  Pt reports no increased knee pain after Rx when out of the pool.    Comorbidities Hx of lumbar spasms.  8 surgeries on L knee including L TKA and a patella fx.  Lumbar DDD and L5-S1 Lumbar microdiscectomy on 01/27/17,  OA in bilat hips and she states she needs a THR on R.  DM.    PT Treatment/Interventions ADLs/Self Care Home Management;Aquatic Therapy;Cryotherapy;Electrical Stimulation;Moist Heat;Gait training;Stair training;Functional mobility training;Therapeutic activities;Therapeutic exercise;Balance training;Neuromuscular re-education;Manual techniques;Patient/family education    PT Next Visit Plan Give LEFS.  use noodle with ambulation next visit.  Assess response to aquatic Rx.  Cont with aquatic Rx's for  now due to pt's response to prior land based Rx. Slowly increase aquatic exercises per pt tolerance.    Consulted and Agree with Plan of Care Patient             Patient will benefit from skilled therapeutic intervention in order to improve the following deficits and impairments:  Decreased activity tolerance, Abnormal gait, Decreased mobility, Decreased endurance, Decreased range of motion, Decreased strength, Increased edema, Difficulty walking, Pain  Visit Diagnosis: Chronic pain of right knee  Muscle weakness (generalized)  Difficulty in walking, not elsewhere classified  Stiffness of right knee     Problem List Patient Active Problem List   Diagnosis Date Noted   Cardiac risk counseling 02/22/2019   Elevated liver function tests 02/22/2019   Well woman exam without gynecological exam 02/15/2019   Risk for coronary artery disease between 10% and 20% in next 10 years 02/15/2019   Cervical spondylosis with radiculopathy 08/24/2018   HLD (hyperlipidemia) 04/27/2018   Type 2 diabetes mellitus (HCC)  03/11/2018   Primary osteoarthritis of right knee 10/24/2017   Primary osteoarthritis of both hands 10/24/2017   Primary osteoarthritis of left hip 10/24/2017   DDD (degenerative disc disease), lumbar 11/18/2016   Severe episode of recurrent major depressive disorder, without psychotic features (HCC) 03/15/2016   MDD (major depressive disorder), recurrent episode, severe (HCC) 03/15/2016   Insomnia 12/27/2015   Adhesive capsulitis of right shoulder 12/21/2015   Obesity, unspecified 09/20/2013   Carpal tunnel syndrome 09/20/2013   Allergic rhinitis 09/20/2013   H/O total knee replacement 01/25/2013   Depression 12/23/2012   Anxiety 03/13/2012   Hypertension 01/21/2012   Anterior cruciate ligament complete tear 07/01/2011   Tachycardia 11/22/2010   Dyslipidemia 11/22/2010   GERD 03/27/2009    Audie Clear III PT, DPT 11/15/20 1:09 AM   [facility] [address] [place], [place], [address] Phone: [phone]   Fax:  [phone]  Name: Brenda Obrien MRN: [id number] Date of Birth: 12-17-1965

## 2020-11-17 ENCOUNTER — Ambulatory Visit (HOSPITAL_BASED_OUTPATIENT_CLINIC_OR_DEPARTMENT_OTHER): Payer: Self-pay | Admitting: Physical Therapy

## 2020-11-17 DIAGNOSIS — Z9889 Other specified postprocedural states: Secondary | ICD-10-CM | POA: Diagnosis not present

## 2020-11-21 ENCOUNTER — Encounter (HOSPITAL_BASED_OUTPATIENT_CLINIC_OR_DEPARTMENT_OTHER): Payer: Self-pay | Admitting: Physical Therapy

## 2020-11-21 ENCOUNTER — Other Ambulatory Visit: Payer: Self-pay

## 2020-11-21 ENCOUNTER — Ambulatory Visit (HOSPITAL_BASED_OUTPATIENT_CLINIC_OR_DEPARTMENT_OTHER): Payer: Medicare Other | Admitting: Physical Therapy

## 2020-11-21 DIAGNOSIS — R262 Difficulty in walking, not elsewhere classified: Secondary | ICD-10-CM

## 2020-11-21 DIAGNOSIS — M6281 Muscle weakness (generalized): Secondary | ICD-10-CM

## 2020-11-21 DIAGNOSIS — M25661 Stiffness of right knee, not elsewhere classified: Secondary | ICD-10-CM

## 2020-11-21 DIAGNOSIS — G8929 Other chronic pain: Secondary | ICD-10-CM

## 2020-11-21 DIAGNOSIS — M25561 Pain in right knee: Secondary | ICD-10-CM | POA: Diagnosis not present

## 2020-11-21 NOTE — Therapy (Signed)
[facility] [address] [place], [place], [address] Phone: [phone]   Fax:  [phone]  Physical Therapy Treatment  Patient Details  Name: Brenda Obrien MRN: [id number] Date of Birth: [birth year] Referring Provider (PT): Milus Mallick, PA-C (Surgeon:  Dr. Shanon Rosser)   Encounter Date: 11/21/2020   PT End of Session - 11/21/20 1010     Visit Number 4    Number of Visits 12    Date for PT Re-Evaluation 12/04/20    Authorization Type Medicare and UHC    Progress Note Due on Visit 10    PT Start Time 0812    PT Stop Time 0854    PT Time Calculation (min) 42 min    Activity Tolerance Patient tolerated treatment well    Behavior During Therapy [facility] for tasks assessed/performed             Past Medical History:  Diagnosis Date   Asthma    Cataract    Depression    Diabetes mellitus, type 2 (HCC) 02/2018   Elevated cholesterol    Endometriosis    Generalized anxiety disorder 2017   GERD (gastroesophageal reflux disease)    Glaucoma    HSV-1 infection 06/1998   Hypertension    PTSD (post-traumatic stress disorder) 2017    Past Surgical History:  Procedure Laterality Date   ANTERIOR CRUCIATE LIGAMENT REPAIR  1987,2001,2003,2005   BACK SURGERY  01/2017   due herniated disc   CERVICAL FUSION  10/21/2018   EYE SURGERY Bilateral 2016   release pressure    LAPAROSCOPIC CHOLECYSTECTOMY  5/14   [place], [place]   LAPAROSCOPIC TOTAL HYSTERECTOMY  6/11   left knee surgery  09/2011   NASAL SINUS SURGERY  1992   REPLACEMENT TOTAL KNEE Left 9/14   Dr. Elliot Dally   SHOULDER SURGERY Right 09/08/2015   due to injury    SHOULDER SURGERY Right 03/20/2016   due to injury   SHOULDER SURGERY Left 09/10/2017   due rotator cuff    There were no vitals filed for this visit.   Subjective Assessment - 11/21/20 0815     Subjective Pt is 6.5 weeks s/p R knee meniscectomy, chondroplasty, and majoy synovectomy.  Pt saw MD last friday  who showed her pictures and informed her she had 3 places in her knee that were bone on bone and she has bone spurs.  MD wanted pt to cont with aquatic PT.  Pt states she had no increased R knee pain after prior Rx though had increased R hip pain.  Pt states her R hip popped during aquatic therapy and when she walked to her truck after prior Rx and has been bothering her since.  She continues to have stabbing pains in R medial knee.  pt is compliant with prior land based HEP.  Pt is limited and painful with standing and ambulation including grocery shopping.  Pt has difficulty performing stairs.    Pertinent History Hx of lumbar spasms.  ITB syndrome and Baker's cyst on R; Previous surgery including lateral release on R.  8 surgeries on L knee including L TKA.  pt had a patella fx after TKA and has difficulty with stairs/incline due to L knee.  Lumbar DDD and L5-S1 Lumbar microdiscectomy on 01/27/17, anterior cervical discectomy and fusion C5-C6 10/21/18.  OA in bilat hips and she states she needs a THR on R.  Bilat shoulder RCR.  DM, HSV-1.    Currently in Pain? Yes  Pain Score 4    Also 3-4/10 pain in R hip   Pain Location Knee    Pain Orientation Right    Effect of Pain on Daily Activities walking, standing, sitting mobility                -Pt seen for aquatic therapy today.  Treatment took place in water 3.5-4.0 ft in depth at the [facility] pool. Temp of water was 94.  Pt entered/exited the pool via stairs with step to gait pattern with bilat rail. -Reviewed response to prior Rx, current function, HEP compliance, and pain level.   -Pt ambulated fwd3 laps and ambulated bwd 3 laps with verbal and visual instruction.   -Pt performed:     -marching 2 x 10 reps with bilat UE assist on pool edge.     -Standing Hip flex and extension x 10 reps each bilat and standing heel raises 2x10 reps with UE assist on pool edge.      -Seated bicycles 2x20 reps and seated flutter kicks 2x10  reps.     - Passive floating with ankle floats, neck float, and 2 squoodles to promote relaxation and improve pain, tightness, and muscle tension   Pt requires buoyancy for support and to offload joints with strengthening exercises. Viscosity of the water is needed for resistance of strengthening; water current perturbations provides challenge to standing balance unsupported, requiring increased core activation.        PT Education - 11/21/20 1452     Education Details instructed pt in correct form and appropriate depth of water exercises.  Educated pt on the properties and benefits of aquatic therapy including buoyancy and viscosity.    Person(s) Educated Patient    Methods Explanation;Demonstration;Verbal cues    Comprehension Verbalized understanding;Returned demonstration              PT Short Term Goals - 11/06/20 2220       PT SHORT TERM GOAL #1   Title Pt wil report worst pain to be no greater than 6-7/10 for improved tolerance to activity.    Baseline 8-9/10 pain    Time 3    Period Weeks    Status New    Target Date 11/27/20      PT SHORT TERM GOAL #2   Title Pt will demo improved heel strike upon initial contact and knee flexion with toe off to assist with normalizing gait.    Time 3    Period Weeks    Status New    Target Date 11/27/20      PT SHORT TERM GOAL #3   Title Pt will demo R knee extension AROM to 0 deg and supine SLR to be performed without quad lag for improved gait, stiffness, and strength.    Baseline 1 deg of knee extension AROM,  slight quad lag with supine SLR    Time 3    Period Weeks    Status New    Target Date 11/27/20      PT SHORT TERM GOAL #4   Title Pt will demo good tolerance with the combination of aquatic and land based exercises for improved tolerance to activity, strength, and mobility.    Time 3    Period Weeks    Status New    Target Date 11/27/20      PT SHORT TERM GOAL #5   Title Pt will ambulate in the grocery store  without significant R knee pain.    Time  3    Period Weeks    Status New    Target Date 11/27/20      Additional Short Term Goals   Additional Short Term Goals Yes      PT SHORT TERM GOAL #6   Title Pt will be able to perform 6 inch step ups with good stability and form for improved performance of ascending/descending curbs and steps.    Time 4    Period Weeks    Status New    Target Date 12/04/20               PT Long Term Goals - 11/06/20 2226       PT LONG TERM GOAL #1   Title Pt will be able to perform her normal daily transfers without significant R knee pain or difficulty.    Time 6    Period Weeks    Status New    Target Date 12/18/20      PT LONG TERM GOAL #2   Title Pt will ambulate extended community distance without significant R knee pain or limitation from R knee.    Time 6    Period Weeks    Status New    Target Date 12/18/20      PT LONG TERM GOAL #3   Title Pt will demo improved R hip flex and abd strength and knee flex and ext MMT strength to 5/5 for improved tolerance to functional mobility skills.    Time 6    Period Weeks    Status New    Target Date 12/18/20      PT LONG TERM GOAL #4   Title Pt will be independent and compliant with a HEP for improved pain, ROM, strength, function, and mobility.    Time 6    Period Weeks    Status New    Target Date 12/18/20      PT LONG TERM GOAL #5   Title Pt will demo improved standing tolerance and perform her normal household chores and cleaning without signifiant R knee pain or limitations from R knee.    Time 6    Period Weeks    Status New    Target Date 12/18/20                   Plan - 11/21/20 1456     Clinical Impression Statement Pt saw MD last week who informed her she had 3 areas of bone on bone in R knee.  Pt has been having R hip pain.  Pt demonstrated improved tolerance with aquatic exercises today and had reduced sharp medial knee pain during Rx compared to prior Rx.  Pt  ambulated in the pool with much improved ease and less c/o's of pain today.  Did not give noodle for ambulation due to pt tolerating ambulation fwd and bwd well.  Pt does report of bilat hip pain with aquatic exercises though reports her knee was doing well.  PT had pt perform passive floating today and Pt reports improved pain and states her back feels really good with passive floating in the pool.  Pt responded much better to aquatic Rx than prior Rx and reports no increased knee pain after Rx.    Comorbidities Hx of lumbar spasms.  8 surgeries on L knee including L TKA and a patella fx.  Lumbar DDD and L5-S1 Lumbar microdiscectomy on 01/27/17,  OA in bilat hips and she states she needs a THR on R.  DM.    PT Treatment/Interventions ADLs/Self Care Home Management;Aquatic Therapy;Cryotherapy;Electrical Stimulation;Moist Heat;Gait training;Stair training;Functional mobility training;Therapeutic activities;Therapeutic exercise;Balance training;Neuromuscular re-education;Manual techniques;Patient/family education    PT Next Visit Plan Give LEFS.  Assess response to aquatic Rx.  Cont with aquatic Rx's for now due to pt's response to prior land based Rx. Slowly increase aquatic exercises per pt tolerance.    PT Home Exercise Plan Pt to cont with prior land based HEP.    Consulted and Agree with Plan of Care Patient             Patient will benefit from skilled therapeutic intervention in order to improve the following deficits and impairments:  Decreased activity tolerance, Abnormal gait, Decreased mobility, Decreased endurance, Decreased range of motion, Decreased strength, Increased edema, Difficulty walking, Pain  Visit Diagnosis: Chronic pain of right knee  Muscle weakness (generalized)  Difficulty in walking, not elsewhere classified  Stiffness of right knee     Problem List Patient Active Problem List   Diagnosis Date Noted   Cardiac risk counseling 02/22/2019   Elevated liver  function tests 02/22/2019   Well woman exam without gynecological exam 02/15/2019   Risk for coronary artery disease between 10% and 20% in next 10 years 02/15/2019   Cervical spondylosis with radiculopathy 08/24/2018   HLD (hyperlipidemia) 04/27/2018   Type 2 diabetes mellitus (HCC) 03/11/2018   Primary osteoarthritis of right knee 10/24/2017   Primary osteoarthritis of both hands 10/24/2017   Primary osteoarthritis of left hip 10/24/2017   DDD (degenerative disc disease), lumbar 11/18/2016   Severe episode of recurrent major depressive disorder, without psychotic features (HCC) 03/15/2016   MDD (major depressive disorder), recurrent episode, severe (HCC) 03/15/2016   Insomnia 12/27/2015   Adhesive capsulitis of right shoulder 12/21/2015   Obesity, unspecified 09/20/2013   Carpal tunnel syndrome 09/20/2013   Allergic rhinitis 09/20/2013   H/O total knee replacement 01/25/2013   Depression 12/23/2012   Anxiety 03/13/2012   Hypertension 01/21/2012   Anterior cruciate ligament complete tear 07/01/2011   Tachycardia 11/22/2010   Dyslipidemia 11/22/2010   GERD 03/27/2009    Audie Clearoby Rosann Gorum III PT, DPT 11/21/20 3:07 PM   [facility] [address] [place], [place], [address] Phone: [phone]   Fax:  [phone]  Name: Vladimir CroftsDonna L Semel MRN: [id number] Date of Birth: 04/28/1965

## 2020-11-23 ENCOUNTER — Encounter (HOSPITAL_BASED_OUTPATIENT_CLINIC_OR_DEPARTMENT_OTHER): Payer: Self-pay | Admitting: Physical Therapy

## 2020-11-23 ENCOUNTER — Ambulatory Visit (HOSPITAL_BASED_OUTPATIENT_CLINIC_OR_DEPARTMENT_OTHER): Payer: Medicare Other | Admitting: Physical Therapy

## 2020-11-23 ENCOUNTER — Other Ambulatory Visit: Payer: Self-pay

## 2020-11-23 DIAGNOSIS — R262 Difficulty in walking, not elsewhere classified: Secondary | ICD-10-CM

## 2020-11-23 DIAGNOSIS — M25561 Pain in right knee: Secondary | ICD-10-CM

## 2020-11-23 DIAGNOSIS — G8929 Other chronic pain: Secondary | ICD-10-CM

## 2020-11-23 DIAGNOSIS — M6281 Muscle weakness (generalized): Secondary | ICD-10-CM

## 2020-11-23 DIAGNOSIS — M25661 Stiffness of right knee, not elsewhere classified: Secondary | ICD-10-CM

## 2020-11-23 NOTE — Therapy (Signed)
[facility] [address] [place], [place], [address] Phone: [phone]   Fax:  [phone]  Physical Therapy Treatment  Patient Details  Name: Brenda CroftsDonna L Obrien MRN: [id number] Date of Birth: 03/14/1966 Referring Provider (PT): Milus MallickEric Eck, PA-C (Surgeon:  Dr. Shanon RosserJohn Hubbard)   Encounter Date: 11/23/2020   PT End of Session - 11/23/20 0912     Visit Number 5    Number of Visits 12    Date for PT Re-Evaluation 12/04/20    Authorization Type Medicare and UHC    Progress Note Due on Visit 10    PT Start Time 0850    PT Stop Time 0935    PT Time Calculation (min) 45 min    Activity Tolerance Patient tolerated treatment well    Behavior During Therapy [facility] for tasks assessed/performed             Past Medical History:  Diagnosis Date   Asthma    Cataract    Depression    Diabetes mellitus, type 2 (HCC) 02/2018   Elevated cholesterol    Endometriosis    Generalized anxiety disorder 2017   GERD (gastroesophageal reflux disease)    Glaucoma    HSV-1 infection 06/1998   Hypertension    PTSD (post-traumatic stress disorder) 2017    Past Surgical History:  Procedure Laterality Date   ANTERIOR CRUCIATE LIGAMENT REPAIR  1987,2001,2003,2005   BACK SURGERY  01/2017   due herniated disc   CERVICAL FUSION  10/21/2018   EYE SURGERY Bilateral 2016   release pressure    LAPAROSCOPIC CHOLE[place]  5/14   [place]   LAPAROSCOPIC TOTAL HYSTERECTOMY  6/11   left knee surgery  09/2011   NASAL SINUS SURGERY  1992   REPLACEMENT TOTAL KNEE Left 9/14   Dr. Elliot Dallyavid Martin   SHOULDER SURGERY Right 09/08/2015   due to injury    SHOULDER SURGERY Right 03/20/2016   due to injury   SHOULDER SURGERY Left 09/10/2017   due rotator cuff    There were no vitals filed for this visit.   Subjective Assessment - 11/23/20 0854     Subjective Pt is 7 weeks s/p R knee meniscectomy, chondroplasty, and majoy synovectomy.  Pt saw MD last friday  who showed her pictures and informed her she had 3 places in her knee that were bone on bone and she has bone spurs.  MD wanted pt to cont with aquatic PT.  She continues to have stabbing pains in R medial knee.  pt is compliant with prior land based HEP.  Pt is limited and painful with standing and ambulation including grocery shopping.  Pt has difficulty performing stairs.  Pt reports no increased knee pain after prior Rx though did have increased R hip pain.  Pt states she she had an awful night of sleeping last night due to bilat hip and R knee pain.  Pt states she just began taking celebrex this AM but doesn't know the dosage.  She states she will add that to her medical record.    Pertinent History Hx of lumbar spasms.  ITB syndrome and Baker's cyst on R; Previous surgery including lateral release on R.  8 surgeries on L knee including L TKA.  pt had a patella fx after TKA and has difficulty with stairs/incline due to L knee.  Lumbar DDD and L5-S1 Lumbar microdiscectomy on 01/27/17, anterior cervical discectomy and fusion C5-C6 10/21/18.  OA in bilat hips and she states she  needs a THR on R.  Bilat shoulder RCR.  DM, HSV-1.    Currently in Pain? Yes    Pain Score 6     Pain Location Knee    Pain Orientation Right               -Pt seen for aquatic therapy today.  Treatment took place in water 3.5-4.0 ft in depth at the [facility] pool. Temp of water was 94.  Pt entered/exited the pool via stairs with step to gait pattern with bilat rail. -Reviewed response to prior Rx, current function, HEP compliance, and pain level.   -Pt ambulated fwd 3 laps and ambulated bwd 3 laps with verbal and visual instruction.   -Pt performed:     -marching 2 x 10 reps with bilat UE assist on pool edge.     -Standing Hip flex, abd, and extension x 10 reps each bilat and standing heel raises 2x10 reps with UE assist on pool edge.      -Seated bicycles 2x20 reps and seated flutter kicks 2x10 reps.   Seated knee flex/ext 2x10 reps on R.     - Passive floating with ankle floats, neck float, and 2 squoodles to promote relaxation and improve pain, tightness, and muscle tension   Pt requires buoyancy for support and to offload joints with strengthening exercises. Viscosity of the water is needed for resistance of strengthening; water current perturbations provides challenge to standing balance unsupported, requiring increased core activation.      PT Education - 11/23/20 1322     Education Details instructed pt in correct form with aquatic exercises.  Educated pt on the properties and benefits of aquatic therapy.    Person(s) Educated Patient    Methods Explanation;Demonstration;Verbal cues    Comprehension Verbalized understanding;Returned demonstration              PT Short Term Goals - 11/06/20 2220       PT SHORT TERM GOAL #1   Title Pt wil report worst pain to be no greater than 6-7/10 for improved tolerance to activity.    Baseline 8-9/10 pain    Time 3    Period Weeks    Status New    Target Date 11/27/20      PT SHORT TERM GOAL #2   Title Pt will demo improved heel strike upon initial contact and knee flexion with toe off to assist with normalizing gait.    Time 3    Period Weeks    Status New    Target Date 11/27/20      PT SHORT TERM GOAL #3   Title Pt will demo R knee extension AROM to 0 deg and supine SLR to be performed without quad lag for improved gait, stiffness, and strength.    Baseline 1 deg of knee extension AROM,  slight quad lag with supine SLR    Time 3    Period Weeks    Status New    Target Date 11/27/20      PT SHORT TERM GOAL #4   Title Pt will demo good tolerance with the combination of aquatic and land based exercises for improved tolerance to activity, strength, and mobility.    Time 3    Period Weeks    Status New    Target Date 11/27/20      PT SHORT TERM GOAL #5   Title Pt will ambulate in the grocery store without significant R  knee pain.  Time 3    Period Weeks    Status New    Target Date 11/27/20      Additional Short Term Goals   Additional Short Term Goals Yes      PT SHORT TERM GOAL #6   Title Pt will be able to perform 6 inch step ups with good stability and form for improved performance of ascending/descending curbs and steps.    Time 4    Period Weeks    Status New    Target Date 12/04/20               PT Long Term Goals - 11/06/20 2226       PT LONG TERM GOAL #1   Title Pt will be able to perform her normal daily transfers without significant R knee pain or difficulty.    Time 6    Period Weeks    Status New    Target Date 12/18/20      PT LONG TERM GOAL #2   Title Pt will ambulate extended community distance without significant R knee pain or limitation from R knee.    Time 6    Period Weeks    Status New    Target Date 12/18/20      PT LONG TERM GOAL #3   Title Pt will demo improved R hip flex and abd strength and knee flex and ext MMT strength to 5/5 for improved tolerance to functional mobility skills.    Time 6    Period Weeks    Status New    Target Date 12/18/20      PT LONG TERM GOAL #4   Title Pt will be independent and compliant with a HEP for improved pain, ROM, strength, function, and mobility.    Time 6    Period Weeks    Status New    Target Date 12/18/20      PT LONG TERM GOAL #5   Title Pt will demo improved standing tolerance and perform her normal household chores and cleaning without signifiant R knee pain or limitations from R knee.    Time 6    Period Weeks    Status New    Target Date 12/18/20                   Plan - 11/23/20 1340     Clinical Impression Statement Pt continues to have stabbing pain in medial knee and bilat hip pain which interfere with daily activities and mobility.  Pt seems to be tolerating aquatic exercises well relating to her R knee though did have increased R hip pain after prior Rx.  Pt performed aquatic  exercises well and added standing hip abd and seated knee flex/ext today.  She tolerated aquatic exercises well though did have some hip pain during Rx.  Pt responded well to Rx having no increased R knee pain after Rx.  Pt states she feels good with passive floating especially her back.  Pt should benefit from cont skilled PT services/aquatic therapy to address ongoing goals and to restore desired level of function.    Comorbidities Hx of lumbar spasms.  8 surgeries on L knee including L TKA and a patella fx.  Lumbar DDD and L5-S1 Lumbar microdiscectomy on 01/27/17,  OA in bilat hips and she states she needs a THR on R.  DM.    PT Treatment/Interventions ADLs/Self Care Home Management;Aquatic Therapy;Cryotherapy;Electrical Stimulation;Moist Heat;Gait training;Stair training;Functional mobility training;Therapeutic activities;Therapeutic exercise;Balance training;Neuromuscular re-education;Manual  techniques;Patient/family education    PT Next Visit Plan Give LEFS.  Assess response to aquatic Rx.  Cont with aquatic Rx's for now due to pt's response to prior land based Rx. Slowly increase aquatic exercises per pt tolerance.    PT Home Exercise Plan Pt to cont with prior land based HEP.    Consulted and Agree with Plan of Care Patient             Patient will benefit from skilled therapeutic intervention in order to improve the following deficits and impairments:  Decreased activity tolerance, Abnormal gait, Decreased mobility, Decreased endurance, Decreased range of motion, Decreased strength, Increased edema, Difficulty walking, Pain  Visit Diagnosis: Chronic pain of right knee  Muscle weakness (generalized)  Difficulty in walking, not elsewhere classified  Stiffness of right knee     Problem List Patient Active Problem List   Diagnosis Date Noted   Cardiac risk counseling 02/22/2019   Elevated liver function tests 02/22/2019   Well woman exam without gynecological exam 02/15/2019    Risk for coronary artery disease between 10% and 20% in next 10 years 02/15/2019   Cervical spondylosis with radiculopathy 08/24/2018   HLD (hyperlipidemia) 04/27/2018   Type 2 diabetes mellitus (HCC) 03/11/2018   Primary osteoarthritis of right knee 10/24/2017   Primary osteoarthritis of both hands 10/24/2017   Primary osteoarthritis of left hip 10/24/2017   DDD (degenerative disc disease), lumbar 11/18/2016   Severe episode of recurrent major depressive disorder, without psychotic features (HCC) 03/15/2016   MDD (major depressive disorder), recurrent episode, severe (HCC) 03/15/2016   Insomnia 12/27/2015   Adhesive capsulitis of right shoulder 12/21/2015   Obesity, unspecified 09/20/2013   Carpal tunnel syndrome 09/20/2013   Allergic rhinitis 09/20/2013   H/O total knee replacement 01/25/2013   Depression 12/23/2012   Anxiety 03/13/2012   Hypertension 01/21/2012   Anterior cruciate ligament complete tear 07/01/2011   Tachycardia 11/22/2010   Dyslipidemia 11/22/2010   GERD 03/27/2009    Audie Clear III PT, DPT 11/23/20 1:50 PM   [facility] [address] [place], [place], [address] Phone: [phone]   Fax:  [phone]  Name: Brenda Obrien MRN: [id number] Date of Birth: 07/11/1965

## 2020-11-28 ENCOUNTER — Ambulatory Visit (HOSPITAL_BASED_OUTPATIENT_CLINIC_OR_DEPARTMENT_OTHER): Payer: Medicare Other | Admitting: Physical Therapy

## 2020-11-28 ENCOUNTER — Other Ambulatory Visit: Payer: Self-pay

## 2020-11-28 ENCOUNTER — Encounter (HOSPITAL_BASED_OUTPATIENT_CLINIC_OR_DEPARTMENT_OTHER): Payer: Self-pay | Admitting: Physical Therapy

## 2020-11-28 DIAGNOSIS — M25661 Stiffness of right knee, not elsewhere classified: Secondary | ICD-10-CM

## 2020-11-28 DIAGNOSIS — M25561 Pain in right knee: Secondary | ICD-10-CM

## 2020-11-28 DIAGNOSIS — M6281 Muscle weakness (generalized): Secondary | ICD-10-CM

## 2020-11-28 DIAGNOSIS — G8929 Other chronic pain: Secondary | ICD-10-CM

## 2020-11-28 DIAGNOSIS — R262 Difficulty in walking, not elsewhere classified: Secondary | ICD-10-CM

## 2020-11-28 NOTE — Therapy (Signed)
[facility] [address] [place], [place], [address] Phone: [phone]   Fax:  [phone]  Physical Therapy Treatment  Patient Details  Name: Brenda Obrien MRN: [id number] Date of Birth: 07-19-1965 Referring Provider (PT): Milus Mallick, PA-C (Surgeon:  Dr. Shanon Rosser)   Encounter Date: 11/28/2020   PT End of Session - 11/28/20 1558     Visit Number 6    Number of Visits 12    Date for PT Re-Evaluation 12/04/20    Authorization Type Medicare and UHC    PT Start Time 1540    PT Stop Time 1625    PT Time Calculation (min) 45 min    Equipment Utilized During Treatment Other (comment)   noodle   Activity Tolerance Patient tolerated treatment well    Behavior During Therapy [facility] for tasks assessed/performed             Past Medical History:  Diagnosis Date   Asthma    Cataract    Depression    Diabetes mellitus, type 2 (HCC) 02/2018   Elevated cholesterol    Endometriosis    Generalized anxiety disorder 2017   GERD (gastroesophageal reflux disease)    Glaucoma    HSV-1 infection 06/1998   Hypertension    PTSD (post-traumatic stress disorder) 2017    Past Surgical History:  Procedure Laterality Date   ANTERIOR CRUCIATE LIGAMENT REPAIR  1987,2001,2003,2005   BACK SURGERY  01/2017   due herniated disc   CERVICAL FUSION  10/21/2018   EYE SURGERY Bilateral 2016   release pressure    LAPAROSCOPIC CHOLECYSTECTOMY  5/14   [place], [place]   LAPAROSCOPIC TOTAL HYSTERECTOMY  6/11   left knee surgery  09/2011   NASAL SINUS SURGERY  1992   REPLACEMENT TOTAL KNEE Left 9/14   Dr. Elliot Dally   SHOULDER SURGERY Right 09/08/2015   due to injury    SHOULDER SURGERY Right 03/20/2016   due to injury   SHOULDER SURGERY Left 09/10/2017   due rotator cuff    There were no vitals filed for this visit.   Subjective Assessment - 11/28/20 1833     Subjective "No relief from pain. IT band particularly painful. Celebrex causing  nausea but I continue to take hoping for pain relief."                                         PT Short Term Goals - 11/06/20 2220       PT SHORT TERM GOAL #1   Title Pt wil report worst pain to be no greater than 6-7/10 for improved tolerance to activity.    Baseline 8-9/10 pain    Time 3    Period Weeks    Status New    Target Date 11/27/20      PT SHORT TERM GOAL #2   Title Pt will demo improved heel strike upon initial contact and knee flexion with toe off to assist with normalizing gait.    Time 3    Period Weeks    Status New    Target Date 11/27/20      PT SHORT TERM GOAL #3   Title Pt will demo R knee extension AROM to 0 deg and supine SLR to be performed without quad lag for improved gait, stiffness, and strength.    Baseline 1 deg of knee extension AROM,  slight quad lag with supine SLR    Time 3    Period Weeks    Status New    Target Date 11/27/20      PT SHORT TERM GOAL #4   Title Pt will demo good tolerance with the combination of aquatic and land based exercises for improved tolerance to activity, strength, and mobility.    Time 3    Period Weeks    Status New    Target Date 11/27/20      PT SHORT TERM GOAL #5   Title Pt will ambulate in the grocery store without significant R knee pain.    Time 3    Period Weeks    Status New    Target Date 11/27/20      Additional Short Term Goals   Additional Short Term Goals Yes      PT SHORT TERM GOAL #6   Title Pt will be able to perform 6 inch step ups with good stability and form for improved performance of ascending/descending curbs and steps.    Time 4    Period Weeks    Status New    Target Date 12/04/20               PT Long Term Goals - 11/06/20 2226       PT LONG TERM GOAL #1   Title Pt will be able to perform her normal daily transfers without significant R knee pain or difficulty.    Time 6    Period Weeks    Status New    Target Date 12/18/20      PT  LONG TERM GOAL #2   Title Pt will ambulate extended community distance without significant R knee pain or limitation from R knee.    Time 6    Period Weeks    Status New    Target Date 12/18/20      PT LONG TERM GOAL #3   Title Pt will demo improved R hip flex and abd strength and knee flex and ext MMT strength to 5/5 for improved tolerance to functional mobility skills.    Time 6    Period Weeks    Status New    Target Date 12/18/20      PT LONG TERM GOAL #4   Title Pt will be independent and compliant with a HEP for improved pain, ROM, strength, function, and mobility.    Time 6    Period Weeks    Status New    Target Date 12/18/20      PT LONG TERM GOAL #5   Title Pt will demo improved standing tolerance and perform her normal household chores and cleaning without signifiant R knee pain or limitations from R knee.    Time 6    Period Weeks    Status New    Target Date 12/18/20              -Pt seen for aquatic therapy today.  Treatment took place in water 3.5-4.0 ft in depth at the [facility] pool. Temp of water was 94.  Pt entered/exited the pool via stairs with step to gait pattern with bilat rail. -Reviewed response to prior Rx, current function, HEP compliance, and pain level.   -Pt ambulated fwd 3 laps and ambulated bwd 3 laps with verbal and visual instruction.   -Pt performed:     -marching 2 x 10 reps with bilat UE assist on pool edge.     -  Standing Hip flex, abd, and extension x 10 reps each bilat and standing heel raises x10 reps with UE assist on pool edge.       -Balance and core strength challenges as well as gastroc stretching standing with wide BOS on noodle.  Gaining static balance with heels and then toes on ground x 30 sec (requiring many trails); Than progressed to rotating over noodle gaining balance heel and toe.  Pt reports increased rle discomfort upon completion. IT band stretching rle in standing x 3 holding x 20 seconds.  Pt requires  buoyancy for support and to offload joints with strengthening exercises. Viscosity of the water is needed for resistance of strengthening; water current perturbations provides challenge to standing balance unsupported, requiring increased core activation.         Plan - 11/28/20 1836     Clinical Impression Statement Focus on stretching of LE and LB with special attention to right IT band. Pt has stabbing pains throughout Rx in hips. Tolerates stretching fair. She puts forth excellent effort and works through pain as able. Gait out of water antalgic, improes in water.  Added noodle push downs working eccentrically, which pt reports less discomfort with in RLE.  LLE some discomfort patella gaining position.    Comorbidities Hx of lumbar spasms.  8 surgeries on L knee including L TKA and a patella fx.  Lumbar DDD and L5-S1 Lumbar microdiscectomy on 01/27/17,  OA in bilat hips and she states she needs a THR on R.  DM.    PT Treatment/Interventions ADLs/Self Care Home Management;Aquatic Therapy;Cryotherapy;Electrical Stimulation;Moist Heat;Gait training;Stair training;Functional mobility training;Therapeutic activities;Therapeutic exercise;Balance training;Neuromuscular re-education;Manual techniques;Patient/family education             Patient will benefit from skilled therapeutic intervention in order to improve the following deficits and impairments:  Decreased activity tolerance, Abnormal gait, Decreased mobility, Decreased endurance, Decreased range of motion, Decreased strength, Increased edema, Difficulty walking, Pain  Visit Diagnosis: Chronic pain of right knee  Muscle weakness (generalized)  Difficulty in walking, not elsewhere classified  Stiffness of right knee     Problem List Patient Active Problem List   Diagnosis Date Noted   Cardiac risk counseling 02/22/2019   Elevated liver function tests 02/22/2019   Well woman exam without gynecological exam 02/15/2019   Risk  for coronary artery disease between 10% and 20% in next 10 years 02/15/2019   Cervical spondylosis with radiculopathy 08/24/2018   HLD (hyperlipidemia) 04/27/2018   Type 2 diabetes mellitus (HCC) 03/11/2018   Primary osteoarthritis of right knee 10/24/2017   Primary osteoarthritis of both hands 10/24/2017   Primary osteoarthritis of left hip 10/24/2017   DDD (degenerative disc disease), lumbar 11/18/2016   Severe episode of recurrent major depressive disorder, without psychotic features (HCC) 03/15/2016   MDD (major depressive disorder), recurrent episode, severe (HCC) 03/15/2016   Insomnia 12/27/2015   Adhesive capsulitis of right shoulder 12/21/2015   Obesity, unspecified 09/20/2013   Carpal tunnel syndrome 09/20/2013   Allergic rhinitis 09/20/2013   H/O total knee replacement 01/25/2013   Depression 12/23/2012   Anxiety 03/13/2012   Hypertension 01/21/2012   Anterior cruciate ligament complete tear 07/01/2011   Tachycardia 11/22/2010   Dyslipidemia 11/22/2010   GERD 03/27/2009    Jeanmarie Hubert  MPT 11/28/2020, 6:50 PM  [facility] [address] [place], [place], [address] Phone: [phone]   Fax:  [phone]  Name: Brenda Obrien MRN: [id number] Date of Birth: 13-Jul-1965

## 2020-11-29 DIAGNOSIS — F331 Major depressive disorder, recurrent, moderate: Secondary | ICD-10-CM | POA: Diagnosis not present

## 2020-11-29 DIAGNOSIS — F411 Generalized anxiety disorder: Secondary | ICD-10-CM | POA: Diagnosis not present

## 2020-11-30 ENCOUNTER — Ambulatory Visit (HOSPITAL_BASED_OUTPATIENT_CLINIC_OR_DEPARTMENT_OTHER): Payer: Medicare Other | Admitting: Physical Therapy

## 2020-11-30 ENCOUNTER — Other Ambulatory Visit: Payer: Self-pay

## 2020-11-30 ENCOUNTER — Encounter (HOSPITAL_BASED_OUTPATIENT_CLINIC_OR_DEPARTMENT_OTHER): Payer: Self-pay | Admitting: Physical Therapy

## 2020-11-30 DIAGNOSIS — M25561 Pain in right knee: Secondary | ICD-10-CM | POA: Diagnosis not present

## 2020-11-30 DIAGNOSIS — M25661 Stiffness of right knee, not elsewhere classified: Secondary | ICD-10-CM

## 2020-11-30 DIAGNOSIS — M6281 Muscle weakness (generalized): Secondary | ICD-10-CM

## 2020-11-30 DIAGNOSIS — R262 Difficulty in walking, not elsewhere classified: Secondary | ICD-10-CM

## 2020-11-30 DIAGNOSIS — G8929 Other chronic pain: Secondary | ICD-10-CM

## 2020-11-30 NOTE — Therapy (Signed)
[facility] [address] [place], [place], [address] Phone: [phone]   Fax:  [phone]  Physical Therapy Treatment  Patient Details  Name: Brenda Obrien MRN: [id number] Date of Birth: 1965-04-24 Referring Provider (PT): Milus Mallick, PA-C (Surgeon:  Dr. Shanon Rosser)   Encounter Date: 11/30/2020   PT End of Session - 11/30/20 0910     Visit Number 7    Number of Visits 12    Date for PT Re-Evaluation 12/04/20    Authorization Type Medicare and UHC    Progress Note Due on Visit 10    PT Start Time 0808    PT Stop Time 0853    PT Time Calculation (min) 45 min    Equipment Utilized During Treatment Other (comment)   head float, squoodles, and ankle floats   Activity Tolerance Patient tolerated treatment well    Behavior During Therapy [facility] for tasks assessed/performed             Past Medical History:  Diagnosis Date   Asthma    Cataract    Depression    Diabetes mellitus, type 2 (HCC) 02/2018   Elevated cholesterol    Endometriosis    Generalized anxiety disorder 2017   GERD (gastroesophageal reflux disease)    Glaucoma    HSV-1 infection 06/1998   Hypertension    PTSD (post-traumatic stress disorder) 2017    Past Surgical History:  Procedure Laterality Date   ANTERIOR CRUCIATE LIGAMENT REPAIR  1987,2001,2003,2005   BACK SURGERY  01/2017   due herniated disc   CERVICAL FUSION  10/21/2018   EYE SURGERY Bilateral 2016   release pressure    LAPAROSCOPIC CHOLECYSTECTOMY  5/14   [place], [place]   LAPAROSCOPIC TOTAL HYSTERECTOMY  6/11   left knee surgery  09/2011   NASAL SINUS SURGERY  1992   REPLACEMENT TOTAL KNEE Left 9/14   Dr. Elliot Dally   SHOULDER SURGERY Right 09/08/2015   due to injury    SHOULDER SURGERY Right 03/20/2016   due to injury   SHOULDER SURGERY Left 09/10/2017   due rotator cuff    There were no vitals filed for this visit.   Subjective Assessment - 11/30/20 0810     Subjective Pt  is 8 weeks s/p R knee arthroscopic partial medial meniscectomy, chondroplasty, and major synovectomy (on 10/05/20).  Pt states her knee felt fine after prior Rx though her ITB was "super painful".  Pt states she continues to have stabbing pains in R knee though her strength may be a little better.  Pt states her walking and mobility are about the same.  Pt doesn't thinke she will ever get better with stairs.  Pt reports it being a little easier getting in and out of the car.  Pt states her L hip used to be the worst hip though her R has been worse ever since she had the pop.  Pt states her ITB has been bothering her.  Pt performs ITB rolling and stretching at home.  Pt states the best thing for her pain is hot water.    Pertinent History Hx of lumbar spasms.  ITB syndrome and Baker's cyst on R; Previous surgery including lateral release on R.  8 surgeries on L knee including L TKA.  pt had a patella fx after TKA and has difficulty with stairs/incline due to L knee.  Lumbar DDD and L5-S1 Lumbar microdiscectomy on 01/27/17, anterior cervical discectomy and fusion C5-C6 10/21/18.  OA in  bilat hips and she states she needs a THR on R.  Bilat shoulder RCR.  DM, HSV-1.    Pain Score 4     Pain Location Knee    Pain Orientation Right              -Pt seen for aquatic therapy today.  Treatment took place in water 3.5-4.5 ft in depth at the [facility] pool. Temp of water was 93.  Pt entered/exited the pool via stairs with step to gait pattern with bilat rail. -Reviewed response to prior Rx, current function, HEP compliance, and pain level.   -Pt ambulated fwd 4 laps and ambulated bwd 3 laps.   -Pt performed:     -marching 2 x 10 reps with bilat UE assist on pool edge.     -Standing Hip flex, abd, and extension 2 x 10 reps each bilat and standing heel raises 2x10 reps with UE assist on pool edge.      -Seated bicycles 2x20 reps and seated flutter kicks 2x10 reps.  Seated knee flex/ext 1x10 reps on  R.     -Standing calf stretch 2x20-30 sec bilat at pool wall.  Attempted standing ITB stretch and only performed 1 rep due to pain.     - Passive floating with ankle floats, neck float, and 2 squoodles to promote relaxation and improve pain, tightness, and muscle tension   Pt requires buoyancy for support and to offload joints with strengthening exercises. Viscosity of the water is needed for resistance of strengthening; water current perturbations provides challenge to standing balance unsupported, requiring increased core activation.     PT Education - 11/30/20 0908     Education Details Instructed pt in correct form with aquatic exercises.  Instructed pt to cont with self care management of ITB.  Educated pt to stop stretching ITB if she is having groin/hip pain.    Person(s) Educated Patient    Methods Explanation;Verbal cues;Demonstration    Comprehension Verbalized understanding;Returned demonstration              PT Short Term Goals - 11/06/20 2220       PT SHORT TERM GOAL #1   Title Pt wil report worst pain to be no greater than 6-7/10 for improved tolerance to activity.    Baseline 8-9/10 pain    Time 3    Period Weeks    Status New    Target Date 11/27/20      PT SHORT TERM GOAL #2   Title Pt will demo improved heel strike upon initial contact and knee flexion with toe off to assist with normalizing gait.    Time 3    Period Weeks    Status New    Target Date 11/27/20      PT SHORT TERM GOAL #3   Title Pt will demo R knee extension AROM to 0 deg and supine SLR to be performed without quad lag for improved gait, stiffness, and strength.    Baseline 1 deg of knee extension AROM,  slight quad lag with supine SLR    Time 3    Period Weeks    Status New    Target Date 11/27/20      PT SHORT TERM GOAL #4   Title Pt will demo good tolerance with the combination of aquatic and land based exercises for improved tolerance to activity, strength, and mobility.    Time 3     Period Weeks    Status New  Target Date 11/27/20      PT SHORT TERM GOAL #5   Title Pt will ambulate in the grocery store without significant R knee pain.    Time 3    Period Weeks    Status New    Target Date 11/27/20      Additional Short Term Goals   Additional Short Term Goals Yes      PT SHORT TERM GOAL #6   Title Pt will be able to perform 6 inch step ups with good stability and form for improved performance of ascending/descending curbs and steps.    Time 4    Period Weeks    Status New    Target Date 12/04/20               PT Long Term Goals - 11/06/20 2226       PT LONG TERM GOAL #1   Title Pt will be able to perform her normal daily transfers without significant R knee pain or difficulty.    Time 6    Period Weeks    Status New    Target Date 12/18/20      PT LONG TERM GOAL #2   Title Pt will ambulate extended community distance without significant R knee pain or limitation from R knee.    Time 6    Period Weeks    Status New    Target Date 12/18/20      PT LONG TERM GOAL #3   Title Pt will demo improved R hip flex and abd strength and knee flex and ext MMT strength to 5/5 for improved tolerance to functional mobility skills.    Time 6    Period Weeks    Status New    Target Date 12/18/20      PT LONG TERM GOAL #4   Title Pt will be independent and compliant with a HEP for improved pain, ROM, strength, function, and mobility.    Time 6    Period Weeks    Status New    Target Date 12/18/20      PT LONG TERM GOAL #5   Title Pt will demo improved standing tolerance and perform her normal household chores and cleaning without signifiant R knee pain or limitations from R knee.    Time 6    Period Weeks    Status New    Target Date 12/18/20                   Plan - 11/30/20 0912     Clinical Impression Statement Pt reports her R hip now bothers her more than her L hip and c/o's of R ITB really bothering her.  Pt states her knee  responded fine to today's and prior Rx though ITB pain increased with both.  PT palpated and attempted light STM to R ITB though pt unable to tolerate.  Pt has tightness in ITB and is extremely sensitive to palpation and STM.  Attempted standing ITB stretch though stopped due to pt having R hip pain.  Pt had knee pain with bicycles and LAQ.  She performed exercises well and gave good effort with all exercises.  Pt able to perform the majority of exercises without c/o's of increased knee pain though had issues with her R hip.  Pt states she feels good especially her back with floating.  Pt had no increased R knee pain after Rx.    Comorbidities Hx of lumbar spasms.  8  surgeries on L knee including L TKA and a patella fx.  Lumbar DDD and L5-S1 Lumbar microdiscectomy on 01/27/17,  OA in bilat hips and she states she needs a THR on R.  DM.    PT Treatment/Interventions ADLs/Self Care Home Management;Aquatic Therapy;Cryotherapy;Electrical Stimulation;Moist Heat;Gait training;Stair training;Functional mobility training;Therapeutic activities;Therapeutic exercise;Balance training;Neuromuscular re-education;Manual techniques;Patient/family education    PT Next Visit Plan Give LEFS.  Assess response to aquatic Rx.  Cont with aquatic Rx's for now due to pt's response to prior land based Rx. Slowly increase aquatic exercises per pt tolerance.    PT Home Exercise Plan Pt to cont with prior land based HEP.  Self managemnt of ITB with stretches and rolling    Consulted and Agree with Plan of Care Patient             Patient will benefit from skilled therapeutic intervention in order to improve the following deficits and impairments:  Decreased activity tolerance, Abnormal gait, Decreased mobility, Decreased endurance, Decreased range of motion, Decreased strength, Increased edema, Difficulty walking, Pain  Visit Diagnosis: Chronic pain of right knee  Muscle weakness (generalized)  Difficulty in walking, not  elsewhere classified  Stiffness of right knee     Problem List Patient Active Problem List   Diagnosis Date Noted   Cardiac risk counseling 02/22/2019   Elevated liver function tests 02/22/2019   Well woman exam without gynecological exam 02/15/2019   Risk for coronary artery disease between 10% and 20% in next 10 years 02/15/2019   Cervical spondylosis with radiculopathy 08/24/2018   HLD (hyperlipidemia) 04/27/2018   Type 2 diabetes mellitus (HCC) 03/11/2018   Primary osteoarthritis of right knee 10/24/2017   Primary osteoarthritis of both hands 10/24/2017   Primary osteoarthritis of left hip 10/24/2017   DDD (degenerative disc disease), lumbar 11/18/2016   Severe episode of recurrent major depressive disorder, without psychotic features (HCC) 03/15/2016   MDD (major depressive disorder), recurrent episode, severe (HCC) 03/15/2016   Insomnia 12/27/2015   Adhesive capsulitis of right shoulder 12/21/2015   Obesity, unspecified 09/20/2013   Carpal tunnel syndrome 09/20/2013   Allergic rhinitis 09/20/2013   H/O total knee replacement 01/25/2013   Depression 12/23/2012   Anxiety 03/13/2012   Hypertension 01/21/2012   Anterior cruciate ligament complete tear 07/01/2011   Tachycardia 11/22/2010   Dyslipidemia 11/22/2010   GERD 03/27/2009    Audie Clear III PT, DPT 11/30/20 9:24 AM   [facility] [address] [place], [place], [address] Phone: [phone]   Fax:  [phone]  Name: DIYA GERVASI MRN: [id number] Date of Birth: 1965-11-10

## 2020-12-04 ENCOUNTER — Other Ambulatory Visit: Payer: Self-pay

## 2020-12-04 ENCOUNTER — Ambulatory Visit (HOSPITAL_BASED_OUTPATIENT_CLINIC_OR_DEPARTMENT_OTHER): Payer: Medicare Other | Admitting: Physical Therapy

## 2020-12-04 ENCOUNTER — Encounter (HOSPITAL_BASED_OUTPATIENT_CLINIC_OR_DEPARTMENT_OTHER): Payer: Self-pay | Admitting: Physical Therapy

## 2020-12-04 DIAGNOSIS — M6281 Muscle weakness (generalized): Secondary | ICD-10-CM

## 2020-12-04 DIAGNOSIS — R262 Difficulty in walking, not elsewhere classified: Secondary | ICD-10-CM

## 2020-12-04 DIAGNOSIS — M25661 Stiffness of right knee, not elsewhere classified: Secondary | ICD-10-CM

## 2020-12-04 DIAGNOSIS — G8929 Other chronic pain: Secondary | ICD-10-CM

## 2020-12-04 DIAGNOSIS — M25561 Pain in right knee: Secondary | ICD-10-CM | POA: Diagnosis not present

## 2020-12-04 NOTE — Therapy (Signed)
[facility] [address] [place], [place], [address] Phone: [phone]   Fax:  [phone]  Physical Therapy Treatment  Patient Details  Name: Brenda CroftsDonna L Obrien MRN: [id number] Date of Birth: 07/29/1965 Referring Provider (PT): Milus MallickEric Eck, PA-C (Surgeon:  Dr. Shanon RosserJohn Hubbard)   Encounter Date: 12/04/2020   PT End of Session - 12/04/20 1041     Visit Number 8    Number of Visits 12    Date for PT Re-Evaluation 12/04/20    Authorization Type Medicare and UHC    PT Start Time 0815    PT Stop Time 0900    PT Time Calculation (min) 45 min    Equipment Utilized During Treatment Other (comment)    Activity Tolerance Patient tolerated treatment well    Behavior [facility] for tasks assessed/performed             Past Medical History:  Diagnosis Date   Asthma    Cataract    Depression    Diabetes mellitus, type 2 (HCC) 02/2018   Elevated cholesterol    Endometriosis    Generalized anxiety disorder 2017   GERD (gastroesophageal reflux disease)    Glaucoma    HSV-1 infection 06/1998   Hypertension    PTSD (post-traumatic stress disorder) 2017    Past Surgical History:  Procedure Laterality Date   ANTERIOR CRUCIATE LIGAMENT REPAIR  1987,2001,2003,2005   BACK SURGERY  01/2017   due herniated disc   CERVICAL FUSION  10/21/2018   EYE SURGERY Bilateral 2016   release pressure    LAPAROSCOPIC CHOLE[place]  5/[place]   LAPAROSCOPIC TOTAL HYSTERECTOMY  6/11   left knee surgery  09/2011   NASAL SINUS SURGERY  1992   REPLACEMENT TOTAL KNEE Left 9/14   Dr. Elliot Dallyavid Martin   SHOULDER SURGERY Right 09/08/2015   due to injury    SHOULDER SURGERY Right 03/20/2016   due to injury   SHOULDER SURGERY Left 09/10/2017   due rotator cuff    There were no vitals filed for this visit.   Subjective Assessment - 12/04/20 1036     Subjective "All of that stretching we did on my IT band last visit increased my discomfort  alot.  Couldn't sleep that night. I have been doing the stretches you showed me in sitting every day and I think they have helped a little.  Having a lot of pain and difficulty getting off of my commode, sofa and bed"    Currently in Pain? Yes    Pain Score 5     Pain Orientation Right    Pain Descriptors / Indicators Aching;Sharp    Aggravating Factors  sit to stand, sitting, walking, standing                                       PT Education - 12/04/20 1039     Education Details Assistive devices for commode, modifying bed, and equipment/safety in bathroom    Person(s) Educated Patient    Methods Explanation;Demonstration;Other (comment)   [employer]   Comprehension Verbalized understanding              PT Short Term Goals - 11/06/20 2220       PT SHORT TERM GOAL #1   Title Pt wil report worst pain to be no greater than 6-7/10 for improved tolerance to activity.  Baseline 8-9/10 pain    Time 3    Period Weeks    Status New    Target Date 11/27/20      PT SHORT TERM GOAL #2   Title Pt will demo improved heel strike upon initial contact and knee flexion with toe off to assist with normalizing gait.    Time 3    Period Weeks    Status New    Target Date 11/27/20      PT SHORT TERM GOAL #3   Title Pt will demo R knee extension AROM to 0 deg and supine SLR to be performed without quad lag for improved gait, stiffness, and strength.    Baseline 1 deg of knee extension AROM,  slight quad lag with supine SLR    Time 3    Period Weeks    Status New    Target Date 11/27/20      PT SHORT TERM GOAL #4   Title Pt will demo good tolerance with the combination of aquatic and land based exercises for improved tolerance to activity, strength, and mobility.    Time 3    Period Weeks    Status New    Target Date 11/27/20      PT SHORT TERM GOAL #5   Title Pt will ambulate in the grocery store without significant R knee pain.    Time 3    Period  Weeks    Status New    Target Date 11/27/20      Additional Short Term Goals   Additional Short Term Goals Yes      PT SHORT TERM GOAL #6   Title Pt will be able to perform 6 inch step ups with good stability and form for improved performance of ascending/descending curbs and steps.    Time 4    Period Weeks    Status New    Target Date 12/04/20               PT Long Term Goals - 11/06/20 2226       PT LONG TERM GOAL #1   Title Pt will be able to perform her normal daily transfers without significant R knee pain or difficulty.    Time 6    Period Weeks    Status New    Target Date 12/18/20      PT LONG TERM GOAL #2   Title Pt will ambulate extended community distance without significant R knee pain or limitation from R knee.    Time 6    Period Weeks    Status New    Target Date 12/18/20      PT LONG TERM GOAL #3   Title Pt will demo improved R hip flex and abd strength and knee flex and ext MMT strength to 5/5 for improved tolerance to functional mobility skills.    Time 6    Period Weeks    Status New    Target Date 12/18/20      PT LONG TERM GOAL #4   Title Pt will be independent and compliant with a HEP for improved pain, ROM, strength, function, and mobility.    Time 6    Period Weeks    Status New    Target Date 12/18/20      PT LONG TERM GOAL #5   Title Pt will demo improved standing tolerance and perform her normal household chores and cleaning without signifiant R knee pain or limitations from R  knee.    Time 6    Period Weeks    Status New    Target Date 12/18/20           -Pt seen for aquatic therapy today.  Treatment took place in water 3.5-4.0 ft in depth at the [facility] pool. Temp of water was 94.  Pt entered/exited the pool via stairs with step to gait pattern with bilat rail. -Reviewed response to prior Rx, current function, HEP compliance, and pain level.   Warm up Pt ambulated fwd 5 laps and ambulated bwd 5 laps with  verbal and visual instruction.    Seated Stretching of gastroc, hamstring, int/ext hip rotators, piriformis x 10 min. TC for proper technique LAQ 2 x 10 rep   Standing marching 2 x 10 reps with bilat UE assist on pool edge. Adduction/abd  2 x 10   Pt requires buoyancy for support and to offload joints with strengthening exercises. Viscosity of the water is needed for resistance of strengthening; water current perturbations provides challenge to standing balance unsupported, requiring increased core activation.          Plan - 12/04/20 1042     Clinical Impression Statement Pt reporting and demonstrating issues/difficulty with sit to stand transfers from commode, shower and bed. Focus of treatment is on transfer training using adptive equipment to improve safety and decrease joint wear and tear and pain. PT demonstrates technique and shows pt available adaptive equipment for rising from commode/adding grab bars, Grab bars for shower and positioning, and removing box spring from bed to lower height/added step stool and grab bar to bed to faciliate transfer.  Pt inquiring about massage therapy.    PT Treatment/Interventions ADLs/Self Care Home Management;Aquatic Therapy;Cryotherapy;Electrical Stimulation;Moist Heat;Gait training;Stair training;Functional mobility training;Therapeutic activities;Therapeutic exercise;Balance training;Neuromuscular re-education;Manual techniques;Patient/family education             Patient will benefit from skilled therapeutic intervention in order to improve the following deficits and impairments:  Decreased activity tolerance, Abnormal gait, Decreased mobility, Decreased endurance, Decreased range of motion, Decreased strength, Increased edema, Difficulty walking, Pain  Visit Diagnosis: Chronic pain of right knee  Muscle weakness (generalized)  Difficulty in walking, not elsewhere classified  Stiffness of right knee     Problem List Patient  Active Problem List   Diagnosis Date Noted   Cardiac risk counseling 02/22/2019   Elevated liver function tests 02/22/2019   Well woman exam without gynecological exam 02/15/2019   Risk for coronary artery disease between 10% and 20% in next 10 years 02/15/2019   Cervical spondylosis with radiculopathy 08/24/2018   HLD (hyperlipidemia) 04/27/2018   Type 2 diabetes mellitus (HCC) 03/11/2018   Primary osteoarthritis of right knee 10/24/2017   Primary osteoarthritis of both hands 10/24/2017   Primary osteoarthritis of left hip 10/24/2017   DDD (degenerative disc disease), lumbar 11/18/2016   Severe episode of recurrent major depressive disorder, without psychotic features (HCC) 03/15/2016   MDD (major depressive disorder), recurrent episode, severe (HCC) 03/15/2016   Insomnia 12/27/2015   Adhesive capsulitis of right shoulder 12/21/2015   Obesity, unspecified 09/20/2013   Carpal tunnel syndrome 09/20/2013   Allergic rhinitis 09/20/2013   H/O total knee replacement 01/25/2013   Depression 12/23/2012   Anxiety 03/13/2012   Hypertension 01/21/2012   Anterior cruciate ligament complete tear 07/01/2011   Tachycardia 11/22/2010   Dyslipidemia 11/22/2010   GERD 03/27/2009    Elon Alas Neela Zecca MPT  12/04/2020, 11:00 AM  [facility] [id number]  [place], [place], [address] Phone: [phone]   Fax:  [phone]  Name: Brenda Obrien MRN: [id number] Date of Birth: December 27, 1965

## 2020-12-06 ENCOUNTER — Other Ambulatory Visit: Payer: Self-pay

## 2020-12-06 ENCOUNTER — Encounter (HOSPITAL_BASED_OUTPATIENT_CLINIC_OR_DEPARTMENT_OTHER): Payer: Self-pay | Admitting: Physical Therapy

## 2020-12-06 ENCOUNTER — Ambulatory Visit (HOSPITAL_BASED_OUTPATIENT_CLINIC_OR_DEPARTMENT_OTHER): Payer: Medicare Other | Admitting: Physical Therapy

## 2020-12-06 DIAGNOSIS — M25561 Pain in right knee: Secondary | ICD-10-CM | POA: Diagnosis not present

## 2020-12-06 DIAGNOSIS — M6281 Muscle weakness (generalized): Secondary | ICD-10-CM

## 2020-12-06 DIAGNOSIS — G8929 Other chronic pain: Secondary | ICD-10-CM

## 2020-12-06 DIAGNOSIS — R262 Difficulty in walking, not elsewhere classified: Secondary | ICD-10-CM

## 2020-12-06 DIAGNOSIS — M25661 Stiffness of right knee, not elsewhere classified: Secondary | ICD-10-CM

## 2020-12-06 NOTE — Therapy (Signed)
[facility] [address] [place], [place], [address] Phone: [phone]   Fax:  [phone]  Physical Therapy Treatment  Patient Details  Name: Brenda Obrien MRN: [id number] Date of Birth: 1965/07/05 Referring Provider (PT): Milus Mallick, PA-C (Surgeon:  Dr. Shanon Rosser)   Encounter Date: 12/06/2020   PT End of Session - 12/06/20 0906     Visit Number 9    Number of Visits 12    Date for PT Re-Evaluation 12/04/20    PT Start Time 0815    PT Stop Time 0900    PT Time Calculation (min) 45 min    Equipment Utilized During Treatment Other (comment)    Activity Tolerance Patient tolerated treatment well;Patient limited by pain    Behavior During Therapy [facility] for tasks assessed/performed             Past Medical History:  Diagnosis Date   Asthma    Cataract    Depression    Diabetes mellitus, type 2 (HCC) 02/2018   Elevated cholesterol    Endometriosis    Generalized anxiety disorder 2017   GERD (gastroesophageal reflux disease)    Glaucoma    HSV-1 infection 06/1998   Hypertension    PTSD (post-traumatic stress disorder) 2017    Past Surgical History:  Procedure Laterality Date   ANTERIOR CRUCIATE LIGAMENT REPAIR  1987,2001,2003,2005   BACK SURGERY  01/2017   due herniated disc   CERVICAL FUSION  10/21/2018   EYE SURGERY Bilateral 2016   release pressure    LAPAROSCOPIC CHOLECYSTECTOMY  5/14   [place], [place]   LAPAROSCOPIC TOTAL HYSTERECTOMY  6/11   left knee surgery  09/2011   NASAL SINUS SURGERY  1992   REPLACEMENT TOTAL KNEE Left 9/14   Dr. Elliot Dally   SHOULDER SURGERY Right 09/08/2015   due to injury    SHOULDER SURGERY Right 03/20/2016   due to injury   SHOULDER SURGERY Left 09/10/2017   due rotator cuff    There were no vitals filed for this visit.   Subjective Assessment - 12/06/20 0825     Subjective I am hurting about the same. Ordered some of the equipment, will arrive this w/e    Pain Score  5     Pain Orientation Right    Pain Descriptors / Indicators Aching;Sharp                                         PT Short Term Goals - 11/06/20 2220       PT SHORT TERM GOAL #1   Title Pt wil report worst pain to be no greater than 6-7/10 for improved tolerance to activity.    Baseline 8-9/10 pain    Time 3    Period Weeks    Status New    Target Date 11/27/20      PT SHORT TERM GOAL #2   Title Pt will demo improved heel strike upon initial contact and knee flexion with toe off to assist with normalizing gait.    Time 3    Period Weeks    Status New    Target Date 11/27/20      PT SHORT TERM GOAL #3   Title Pt will demo R knee extension AROM to 0 deg and supine SLR to be performed without quad lag for improved gait, stiffness, and strength.  Baseline 1 deg of knee extension AROM,  slight quad lag with supine SLR    Time 3    Period Weeks    Status New    Target Date 11/27/20      PT SHORT TERM GOAL #4   Title Pt will demo good tolerance with the combination of aquatic and land based exercises for improved tolerance to activity, strength, and mobility.    Time 3    Period Weeks    Status New    Target Date 11/27/20      PT SHORT TERM GOAL #5   Title Pt will ambulate in the grocery store without significant R knee pain.    Time 3    Period Weeks    Status New    Target Date 11/27/20      Additional Short Term Goals   Additional Short Term Goals Yes      PT SHORT TERM GOAL #6   Title Pt will be able to perform 6 inch step ups with good stability and form for improved performance of ascending/descending curbs and steps.    Time 4    Period Weeks    Status New    Target Date 12/04/20               PT Long Term Goals - 11/06/20 2226       PT LONG TERM GOAL #1   Title Pt will be able to perform her normal daily transfers without significant R knee pain or difficulty.    Time 6    Period Weeks    Status New    Target  Date 12/18/20      PT LONG TERM GOAL #2   Title Pt will ambulate extended community distance without significant R knee pain or limitation from R knee.    Time 6    Period Weeks    Status New    Target Date 12/18/20      PT LONG TERM GOAL #3   Title Pt will demo improved R hip flex and abd strength and knee flex and ext MMT strength to 5/5 for improved tolerance to functional mobility skills.    Time 6    Period Weeks    Status New    Target Date 12/18/20      PT LONG TERM GOAL #4   Title Pt will be independent and compliant with a HEP for improved pain, ROM, strength, function, and mobility.    Time 6    Period Weeks    Status New    Target Date 12/18/20      PT LONG TERM GOAL #5   Title Pt will demo improved standing tolerance and perform her normal household chores and cleaning without signifiant R knee pain or limitations from R knee.    Time 6    Period Weeks    Status New    Target Date 12/18/20            -Pt seen for aquatic therapy today.  Treatment took place in water 3.5-4.0 ft in depth at the [facility] pool. Temp of water was 94.  Pt entered/exited the pool via stairs with step to gait pattern with bilat rail. -Reviewed response to prior Rx, current function, HEP compliance, and pain level.  Warm up  Pt ambulated fwd 3 laps and ambulated bwd 3 laps  Seated Gastroc, hamstring and add stretch 3 x 25 sec Knee flex ext 2 x 10 rep Sit  to stand sitting on edge of pool chair x 10 reps   Standing   Submerged to waist     -marching 2 x 10 reps with bilat UE assist on pool edge.     -Standing Hip flex, abd, and extension x 10 reps each bilat and standing heel raises x10 reps with UE assist on pool edge.   Submerged to shoulder using 1 foam hand buoy     -shoulder adduction/abd x 5     -shoulder flex x 8     -horizontal add/abd 2 x 10      -shoulder retraction x 10  Water walking holding buoys at sides x 6 widths (for added buoyancy to decrease  torque on hip and knees)  Pt requires buoyancy for support and to offload joints with strengthening exercises. Viscosity of the water is needed for resistance of strengthening; water current perturbations provides challenge to standing balance unsupported, requiring increased core activation.        Plan - 12/06/20 0848     Clinical Impression Statement Pt has ordered equipment sugested.  Has a nephew that can install shower/tub bars. Pt will have some decrease in pain with addition of appropriate adaptive equipment.  Not much change in overall pain.  She does get immediate decrease in pain entering warm aquatic setting with buoyance componenets. She tolerates challenges fair, needs verbal reinforcment and cueing for execution.  Pt tolerate deep water activities with less discomfort.  Added UE shoulder activities.    PT Treatment/Interventions ADLs/Self Care Home Management;Aquatic Therapy;Cryotherapy;Electrical Stimulation;Moist Heat;Gait training;Stair training;Functional mobility training;Therapeutic activities;Therapeutic exercise;Balance training;Neuromuscular re-education;Manual techniques;Patient/family education             Patient will benefit from skilled therapeutic intervention in order to improve the following deficits and impairments:  Decreased activity tolerance, Abnormal gait, Decreased mobility, Decreased endurance, Decreased range of motion, Decreased strength, Increased edema, Difficulty walking, Pain  Visit Diagnosis: Chronic pain of right knee  Muscle weakness (generalized)  Difficulty in walking, not elsewhere classified  Stiffness of right knee     Problem List Patient Active Problem List   Diagnosis Date Noted   Cardiac risk counseling 02/22/2019   Elevated liver function tests 02/22/2019   Well woman exam without gynecological exam 02/15/2019   Risk for coronary artery disease between 10% and 20% in next 10 years 02/15/2019   Cervical spondylosis with  radiculopathy 08/24/2018   HLD (hyperlipidemia) 04/27/2018   Type 2 diabetes mellitus (HCC) 03/11/2018   Primary osteoarthritis of right knee 10/24/2017   Primary osteoarthritis of both hands 10/24/2017   Primary osteoarthritis of left hip 10/24/2017   DDD (degenerative disc disease), lumbar 11/18/2016   Severe episode of recurrent major depressive disorder, without psychotic features (HCC) 03/15/2016   MDD (major depressive disorder), recurrent episode, severe (HCC) 03/15/2016   Insomnia 12/27/2015   Adhesive capsulitis of right shoulder 12/21/2015   Obesity, unspecified 09/20/2013   Carpal tunnel syndrome 09/20/2013   Allergic rhinitis 09/20/2013   H/O total knee replacement 01/25/2013   Depression 12/23/2012   Anxiety 03/13/2012   Hypertension 01/21/2012   Anterior cruciate ligament complete tear 07/01/2011   Tachycardia 11/22/2010   Dyslipidemia 11/22/2010   GERD 03/27/2009    Jeanmarie Hubert MPT 12/06/2020, 10:58 AM  [facility] [address] [place], [place], [address] Phone: [phone]   Fax:  [phone]  Name: Brenda Obrien MRN: [id number] Date of Birth: July 18, 1965

## 2020-12-11 ENCOUNTER — Other Ambulatory Visit: Payer: Self-pay

## 2020-12-11 ENCOUNTER — Encounter (HOSPITAL_BASED_OUTPATIENT_CLINIC_OR_DEPARTMENT_OTHER): Payer: Self-pay | Admitting: Physical Therapy

## 2020-12-11 ENCOUNTER — Ambulatory Visit (HOSPITAL_BASED_OUTPATIENT_CLINIC_OR_DEPARTMENT_OTHER): Payer: Medicare Other | Admitting: Physical Therapy

## 2020-12-11 DIAGNOSIS — R262 Difficulty in walking, not elsewhere classified: Secondary | ICD-10-CM

## 2020-12-11 DIAGNOSIS — G8929 Other chronic pain: Secondary | ICD-10-CM

## 2020-12-11 DIAGNOSIS — M6281 Muscle weakness (generalized): Secondary | ICD-10-CM

## 2020-12-11 DIAGNOSIS — M25561 Pain in right knee: Secondary | ICD-10-CM

## 2020-12-11 DIAGNOSIS — M25661 Stiffness of right knee, not elsewhere classified: Secondary | ICD-10-CM

## 2020-12-11 NOTE — Therapy (Signed)
[facility] [address] [place], [place], [address] Phone: [phone]   Fax:  [phone]  Physical Therapy Treatment Progress Note Reporting Period 11/06/2020 to 12/11/2020    See note below for Objective Data and Assessment of Progress/Goals.  Patient Details  Name: Brenda Obrien MRN: [id number] Date of Birth: April 05, 1966 Referring Provider (PT): Milus Mallick, PA-C (Surgeon:  Dr. Shanon Rosser)  Encounter Date: 12/11/2020   PT End of Session - 12/11/20 0829     Visit Number 10    Number of Visits 12    Date for PT Re-Evaluation 12/04/20    Authorization Type Medicare and [employer]HC    PT Start Time 0815    PT Stop Time 0905    PT Time Calculation (min) 50 min    Equipment Utilized During Treatment Other (comment)    Activity Tolerance Patient tolerated treatment well;Patient limited by pain    Behavior During Therapy [facility] for tasks assessed/performed             Past Medical History:  Diagnosis Date   Asthma    Cataract    Depression    Diabetes mellitus, type 2 (HCC) 02/2018   Elevated cholesterol    Endometriosis    Generalized anxiety disorder 2017   GERD (gastroesophageal reflux disease)    Glaucoma    HSV-1 infection 06/1998   Hypertension    PTSD (post-traumatic stress disorder) 2017    Past Surgical History:  Procedure Laterality Date   ANTERIOR CRUCIATE LIGAMENT REPAIR  1987,2001,2003,2005   BACK SURGERY  01/2017   due herniated disc   CERVICAL FUSION  10/21/2018   EYE SURGERY Bilateral 2016   release pressure    LAPAROSCOPIC CHOLECYSTECTOMY  5/14   [place], [place]   LAPAROSCOPIC TOTAL HYSTERECTOMY  6/11   left knee surgery  09/2011   NASAL SINUS SURGERY  1992   REPLACEMENT TOTAL KNEE Left 9/14   Dr. Elliot Dally   SHOULDER SURGERY Right 09/08/2015   due to injury    SHOULDER SURGERY Right 03/20/2016   due to injury   SHOULDER SURGERY Left 09/10/2017   due rotator cuff    There were no vitals  filed for this visit.   Subjective Assessment - 12/11/20 0825     Subjective "I got the grab bars for the commode, it has helped alot.  Working on the grab bar for the shower, have some problems with the tile but my nephew is working on it. knees are at about 5/10 today.  Rough week, haven't been able to sleep much because of the pain"    Currently in Pain? Yes    Pain Score 5     Pain Orientation Right;Left    Pain Descriptors / Indicators Aching;Sharp    Pain Type Chronic pain    Pain Onset More than a month ago    Pain Frequency Constant                                         PT Short Term Goals - 12/11/20 0933       PT SHORT TERM GOAL #2   Status Achieved      PT SHORT TERM GOAL #3   Status On-going      PT SHORT TERM GOAL #4   Status On-going  PT Long Term Goals - 11/06/20 2226       PT LONG TERM GOAL #1   Title Pt will be able to perform her normal daily transfers without significant R knee pain or difficulty.    Time 6    Period Weeks    Status New    Target Date 12/18/20      PT LONG TERM GOAL #2   Title Pt will ambulate extended community distance without significant R knee pain or limitation from R knee.    Time 6    Period Weeks    Status New    Target Date 12/18/20      PT LONG TERM GOAL #3   Title Pt will demo improved R hip flex and abd strength and knee flex and ext MMT strength to 5/5 for improved tolerance to functional mobility skills.    Time 6    Period Weeks    Status New    Target Date 12/18/20      PT LONG TERM GOAL #4   Title Pt will be independent and compliant with a HEP for improved pain, ROM, strength, function, and mobility.    Time 6    Period Weeks    Status New    Target Date 12/18/20      PT LONG TERM GOAL #5   Title Pt will demo improved standing tolerance and perform her normal household chores and cleaning without signifiant R knee pain or limitations from R knee.     Time 6    Period Weeks    Status New    Target Date 12/18/20           -Pt seen for aquatic therapy today.  Treatment took place in water 3.5-4.0 ft in depth at the [facility] pool. Temp of water was 94.  Pt entered/exited the pool via stairs with step to gait pattern with bilat rail. -Reviewed response to prior Rx, current function, HEP compliance, and pain level.   Warm up  Pt ambulated fwd 3 laps and ambulated bwd 3 laps   Seated Gastroc, hamstring and add stretch 3 x 25 sec Knee flex ext 2 x 10 rep Sit to stand sitting on edge of pool chair x 10 reps     Standing   Submerged to waist     -marching 2 x 10 reps with bilat UE assist on pool edge.     -Standing Hip flex, abd through add (added IT band stretch), and extension x 10 reps each bilat and standing heel raises x10 reps with UE assist on pool edge.   Submerged to shoulder using 1 foam hand buoy     -shoulder adduction/abd x 10     -shoulder flex x 10     -horizontal add/abd 20     -shoulder retraction x 10  Sidestepping 2 widths of pool with should add/abd    Water walking holding buoys at sides x 6 widths (for added buoyancy to decrease torque on hip and knees)   Pt requires buoyancy for support and to offload joints with strengthening exercises. Viscosity of the water is needed for resistance of strengthening; water current perturbations provides challenge to standing balance unsupported, requiring increased core activation.        Plan - 12/11/20 0830     Clinical Impression Statement Assisiting pt on pain control using adaptive equipment in home.  Not much relief from pain so far as pain is constant although she does report  5/10 when in aquatic setting.  Left IT band unrelenting despite interventions. She has checked with [facility] to understand benefits for future .  Some mild relief from massage last week with another appoint scheduled today. Pt compliant with all stretching and HEP daily. Pt instructed  on hip/knee flex with stair climbing unable to tolerate due to knee pain.  She does demo heel strike f/b toe off with amb in and out of the pool.    Examination-Activity Limitations Bed Mobility;Bend;Stand;Stairs;Squat;Sit;Locomotion Level;Transfers    PT Treatment/Interventions ADLs/Self Care Home Management;Aquatic Therapy;Cryotherapy;Electrical Stimulation;Moist Heat;Gait training;Stair training;Functional mobility training;Therapeutic activities;Therapeutic exercise;Balance training;Neuromuscular re-education;Manual techniques;Patient/family education             Patient will benefit from skilled therapeutic intervention in order to improve the following deficits and impairments:  Decreased activity tolerance, Abnormal gait, Decreased mobility, Decreased endurance, Decreased range of motion, Decreased strength, Increased edema, Difficulty walking, Pain  Visit Diagnosis: Chronic pain of right knee  Muscle weakness (generalized)  Stiffness of right knee  Difficulty in walking, not elsewhere classified     Problem List Patient Active Problem List   Diagnosis Date Noted   Cardiac risk counseling 02/22/2019   Elevated liver function tests 02/22/2019   Well woman exam without gynecological exam 02/15/2019   Risk for coronary artery disease between 10% and 20% in next 10 years 02/15/2019   Cervical spondylosis with radiculopathy 08/24/2018   HLD (hyperlipidemia) 04/27/2018   Type 2 diabetes mellitus (HCC) 03/11/2018   Primary osteoarthritis of right knee 10/24/2017   Primary osteoarthritis of both hands 10/24/2017   Primary osteoarthritis of left hip 10/24/2017   DDD (degenerative disc disease), lumbar 11/18/2016   Severe episode of recurrent major depressive disorder, without psychotic features (HCC) 03/15/2016   MDD (major depressive disorder), recurrent episode, severe (HCC) 03/15/2016   Insomnia 12/27/2015   Adhesive capsulitis of right shoulder 12/21/2015   Obesity,  unspecified 09/20/2013   Carpal tunnel syndrome 09/20/2013   Allergic rhinitis 09/20/2013   H/O total knee replacement 01/25/2013   Depression 12/23/2012   Anxiety 03/13/2012   Hypertension 01/21/2012   Anterior cruciate ligament complete tear 07/01/2011   Tachycardia 11/22/2010   Dyslipidemia 11/22/2010   GERD 03/27/2009    Jeanmarie Hubert MPT 12/11/2020, 9:36 AM  [facility] [address] [place], [place], [address] Phone: [phone]   Fax:  [phone]  Name: Brenda Obrien MRN: [id number] Date of Birth: 18-Apr-1965

## 2020-12-13 ENCOUNTER — Other Ambulatory Visit: Payer: Self-pay

## 2020-12-13 ENCOUNTER — Ambulatory Visit (HOSPITAL_BASED_OUTPATIENT_CLINIC_OR_DEPARTMENT_OTHER): Payer: Medicare Other | Admitting: Physical Therapy

## 2020-12-13 ENCOUNTER — Encounter (HOSPITAL_BASED_OUTPATIENT_CLINIC_OR_DEPARTMENT_OTHER): Payer: Self-pay | Admitting: Physical Therapy

## 2020-12-13 DIAGNOSIS — M6281 Muscle weakness (generalized): Secondary | ICD-10-CM

## 2020-12-13 DIAGNOSIS — F411 Generalized anxiety disorder: Secondary | ICD-10-CM | POA: Diagnosis not present

## 2020-12-13 DIAGNOSIS — M25661 Stiffness of right knee, not elsewhere classified: Secondary | ICD-10-CM

## 2020-12-13 DIAGNOSIS — M25561 Pain in right knee: Secondary | ICD-10-CM | POA: Diagnosis not present

## 2020-12-13 DIAGNOSIS — F331 Major depressive disorder, recurrent, moderate: Secondary | ICD-10-CM | POA: Diagnosis not present

## 2020-12-13 DIAGNOSIS — R262 Difficulty in walking, not elsewhere classified: Secondary | ICD-10-CM

## 2020-12-13 DIAGNOSIS — G8929 Other chronic pain: Secondary | ICD-10-CM

## 2020-12-13 LAB — COLOGUARD

## 2020-12-13 NOTE — Therapy (Signed)
[facility] [address] [place], [place], [address] Phone: [phone]   Fax:  [phone]  Physical Therapy Treatment  Patient Details  Name: Brenda Obrien MRN: [id number] Date of Birth: March 19, 1966 Referring Provider (PT): Milus Mallick, PA-C (Surgeon:  Dr. Shanon Rosser)   Encounter Date: 12/13/2020   PT End of Session - 12/13/20 0823     Visit Number 11    Number of Visits 12    Date for PT Re-Evaluation 12/04/20    PT Start Time 0815    PT Stop Time 0905    PT Time Calculation (min) 50 min    Equipment Utilized During Treatment Other (comment)    Activity Tolerance Patient tolerated treatment well;Patient limited by pain    Behavior During Therapy [facility] for tasks assessed/performed             Past Medical History:  Diagnosis Date   Asthma    Cataract    Depression    Diabetes mellitus, type 2 (HCC) 02/2018   Elevated cholesterol    Endometriosis    Generalized anxiety disorder 2017   GERD (gastroesophageal reflux disease)    Glaucoma    HSV-1 infection 06/1998   Hypertension    PTSD (post-traumatic stress disorder) 2017    Past Surgical History:  Procedure Laterality Date   ANTERIOR CRUCIATE LIGAMENT REPAIR  1987,2001,2003,2005   BACK SURGERY  01/2017   due herniated disc   CERVICAL FUSION  10/21/2018   EYE SURGERY Bilateral 2016   release pressure    LAPAROSCOPIC CHOLECYSTECTOMY  5/14   [place], [place]   LAPAROSCOPIC TOTAL HYSTERECTOMY  6/11   left knee surgery  09/2011   NASAL SINUS SURGERY  1992   REPLACEMENT TOTAL KNEE Left 9/14   Dr. Elliot Dally   SHOULDER SURGERY Right 09/08/2015   due to injury    SHOULDER SURGERY Right 03/20/2016   due to injury   SHOULDER SURGERY Left 09/10/2017   due rotator cuff    There were no vitals filed for this visit.   Subjective Assessment - 12/13/20 0821     Subjective "Everything is about the same.  Had massage again worked on IT band, felt a little better  afterwards but tightened up again by that evening"    Pain Score 4     Pain Orientation Left;Right    Pain Descriptors / Indicators Aching    Pain Type Chronic pain    Pain Onset More than a month ago                                         PT Short Term Goals - 12/11/20 0933       PT SHORT TERM GOAL #2   Status Achieved      PT SHORT TERM GOAL #3   Status On-going      PT SHORT TERM GOAL #4   Status On-going               PT Long Term Goals - 11/06/20 2226       PT LONG TERM GOAL #1   Title Pt will be able to perform her normal daily transfers without significant R knee pain or difficulty.    Time 6    Period Weeks    Status New    Target Date 12/18/20      PT LONG TERM  GOAL #2   Title Pt will ambulate extended community distance without significant R knee pain or limitation from R knee.    Time 6    Period Weeks    Status New    Target Date 12/18/20      PT LONG TERM GOAL #3   Title Pt will demo improved R hip flex and abd strength and knee flex and ext MMT strength to 5/5 for improved tolerance to functional mobility skills.    Time 6    Period Weeks    Status New    Target Date 12/18/20      PT LONG TERM GOAL #4   Title Pt will be independent and compliant with a HEP for improved pain, ROM, strength, function, and mobility.    Time 6    Period Weeks    Status New    Target Date 12/18/20      PT LONG TERM GOAL #5   Title Pt will demo improved standing tolerance and perform her normal household chores and cleaning without signifiant R knee pain or limitations from R knee.    Time 6    Period Weeks    Status New    Target Date 12/18/20            -Pt seen for aquatic therapy today.  Treatment took place in water 3.5-4.0 ft in depth at the [facility] pool. Temp of water was 94.  Pt entered/exited the pool via stairs with step to gait pattern with bilat rail. -Reviewed response to prior Rx, current  function, HEP compliance, and pain level.   Warm up  Pt ambulated fwd 3 laps and ambulated bwd 3 laps   Seated Gastroc, hamstring and add stretch 4-5 x 20 sec Knee flex ext 2 x 10 rep Sit to stand sitting on edge of pool chair x 10 reps Sit to stand from 3rd water step x 10. VC and demo on weight shifting and proper technique     Standing   Submerged to waist     -marching 2 x 10 reps without ue support     -Hip flex;  IT band stretch; abd/add and extension x 10 reps each bilat           and standing heel raises x10 reps.      -partial squats x 10   Submerged to shoulder using 1 foam hand buoy     -shoulder adduction/abd x 10     -shoulder flex x 10     -horizontal add/abd 20     -shoulder retraction x 10    Pt completes 2 widths of pool walking to stretch between exercises     Pt requires buoyancy for support and to offload joints with strengthening exercises. Viscosity of the water is needed for resistance of strengthening; water current perturbations provides challenge to standing balance unsupported, requiring increased core activation.  Patient will benefit from skilled therapeutic intervention in order to improve the following deficits and impairments:  Decreased activity tolerance, Abnormal gait, Decreased mobility, Decreased endurance, Decreased range of motion, Decreased strength, Increased edema, Difficulty walking, Pain  Visit Diagnosis: Chronic pain of right knee  Muscle weakness (generalized)  Stiffness of right knee  Difficulty in walking, not elsewhere classified   Clinical Assessment Pt saw massage therapist again with mild temporary decrease in pain. Initiated treament today with long warm up to ensure warm muscle prior to stretching. Pt having jabbing pains in seated stretching along right IT  band and left hip. She participates well limited by pain  Problem List Patient Active Problem List   Diagnosis Date Noted   Cardiac risk counseling 02/22/2019    Elevated liver function tests 02/22/2019   Well woman exam without gynecological exam 02/15/2019   Risk for coronary artery disease between 10% and 20% in next 10 years 02/15/2019   Cervical spondylosis with radiculopathy 08/24/2018   HLD (hyperlipidemia) 04/27/2018   Type 2 diabetes mellitus (HCC) 03/11/2018   Primary osteoarthritis of right knee 10/24/2017   Primary osteoarthritis of both hands 10/24/2017   Primary osteoarthritis of left hip 10/24/2017   DDD (degenerative disc disease), lumbar 11/18/2016   Severe episode of recurrent major depressive disorder, without psychotic features (HCC) 03/15/2016   MDD (major depressive disorder), recurrent episode, severe (HCC) 03/15/2016   Insomnia 12/27/2015   Adhesive capsulitis of right shoulder 12/21/2015   Obesity, unspecified 09/20/2013   Carpal tunnel syndrome 09/20/2013   Allergic rhinitis 09/20/2013   H/O total knee replacement 01/25/2013   Depression 12/23/2012   Anxiety 03/13/2012   Hypertension 01/21/2012   Anterior cruciate ligament complete tear 07/01/2011   Tachycardia 11/22/2010   Dyslipidemia 11/22/2010   GERD 03/27/2009    Jeanmarie Hubert 12/13/2020, 1:22 PM  [facility] [address] [place], [place], [address] Phone: [phone]   Fax:  [phone]  Name: Brenda Obrien MRN: [id number] Date of Birth: 12-Apr-1966

## 2020-12-13 NOTE — Therapy (Signed)
[facility] [address] [place], [place], [address] Phone: [phone]   Fax:  [phone]  Physical Therapy Treatment  Patient Details  Name: Brenda Obrien MRN: [id number] Date of Birth: 12-14-1965 Referring Provider (PT): Milus Mallick, PA-C (Surgeon:  Dr. Shanon Rosser)   Encounter Date: 12/13/2020   PT End of Session - 12/13/20 0823     Visit Number 11    Number of Visits 12    Date for PT Re-Evaluation 12/04/20    PT Start Time 0815    Equipment Utilized During Treatment Other (comment)    Activity Tolerance Patient tolerated treatment well;Patient limited by pain    Behavior During Therapy [facility] for tasks assessed/performed             Past Medical History:  Diagnosis Date   Asthma    Cataract    Depression    Diabetes mellitus, type 2 (HCC) 02/2018   Elevated cholesterol    Endometriosis    Generalized anxiety disorder 2017   GERD (gastroesophageal reflux disease)    Glaucoma    HSV-1 infection 06/1998   Hypertension    PTSD (post-traumatic stress disorder) 2017    Past Surgical History:  Procedure Laterality Date   ANTERIOR CRUCIATE LIGAMENT REPAIR  1987,2001,2003,2005   BACK SURGERY  01/2017   due herniated disc   CERVICAL FUSION  10/21/2018   EYE SURGERY Bilateral 2016   release pressure    LAPAROSCOPIC CHOLECYSTECTOMY  5/14   [place], [place]   LAPAROSCOPIC TOTAL HYSTERECTOMY  6/11   left knee surgery  09/2011   NASAL SINUS SURGERY  1992   REPLACEMENT TOTAL KNEE Left 9/14   Dr. Elliot Dally   SHOULDER SURGERY Right 09/08/2015   due to injury    SHOULDER SURGERY Right 03/20/2016   due to injury   SHOULDER SURGERY Left 09/10/2017   due rotator cuff    There were no vitals filed for this visit.   Subjective Assessment - 12/13/20 0821     Subjective "Everything is about the same.  Had massage again worked on IT band, felt a little better afterwards but tightened up again by that evening"    Pain  Score 4     Pain Orientation Left;Right    Pain Descriptors / Indicators Aching    Pain Type Chronic pain    Pain Onset More than a month ago                                         PT Short Term Goals - 12/11/20 0933       PT SHORT TERM GOAL #2   Status Achieved      PT SHORT TERM GOAL #3   Status On-going      PT SHORT TERM GOAL #4   Status On-going               PT Long Term Goals - 11/06/20 2226       PT LONG TERM GOAL #1   Title Pt will be able to perform her normal daily transfers without significant R knee pain or difficulty.    Time 6    Period Weeks    Status New    Target Date 12/18/20      PT LONG TERM GOAL #2   Title Pt will ambulate extended community distance without significant R knee pain  or limitation from R knee.    Time 6    Period Weeks    Status New    Target Date 12/18/20      PT LONG TERM GOAL #3   Title Pt will demo improved R hip flex and abd strength and knee flex and ext MMT strength to 5/5 for improved tolerance to functional mobility skills.    Time 6    Period Weeks    Status New    Target Date 12/18/20      PT LONG TERM GOAL #4   Title Pt will be independent and compliant with a HEP for improved pain, ROM, strength, function, and mobility.    Time 6    Period Weeks    Status New    Target Date 12/18/20      PT LONG TERM GOAL #5   Title Pt will demo improved standing tolerance and perform her normal household chores and cleaning without signifiant R knee pain or limitations from R knee.    Time 6    Period Weeks    Status New    Target Date 12/18/20                    Patient will benefit from skilled therapeutic intervention in order to improve the following deficits and impairments:     Visit Diagnosis: No diagnosis found.     Problem List Patient Active Problem List   Diagnosis Date Noted   Cardiac risk counseling 02/22/2019   Elevated liver function tests  02/22/2019   Well woman exam without gynecological exam 02/15/2019   Risk for coronary artery disease between 10% and 20% in next 10 years 02/15/2019   Cervical spondylosis with radiculopathy 08/24/2018   HLD (hyperlipidemia) 04/27/2018   Type 2 diabetes mellitus (HCC) 03/11/2018   Primary osteoarthritis of right knee 10/24/2017   Primary osteoarthritis of both hands 10/24/2017   Primary osteoarthritis of left hip 10/24/2017   DDD (degenerative disc disease), lumbar 11/18/2016   Severe episode of recurrent major depressive disorder, without psychotic features (HCC) 03/15/2016   MDD (major depressive disorder), recurrent episode, severe (HCC) 03/15/2016   Insomnia 12/27/2015   Adhesive capsulitis of right shoulder 12/21/2015   Obesity, unspecified 09/20/2013   Carpal tunnel syndrome 09/20/2013   Allergic rhinitis 09/20/2013   H/O total knee replacement 01/25/2013   Depression 12/23/2012   Anxiety 03/13/2012   Hypertension 01/21/2012   Anterior cruciate ligament complete tear 07/01/2011   Tachycardia 11/22/2010   Dyslipidemia 11/22/2010   GERD 03/27/2009    Jeanmarie Hubert 12/13/2020, 8:27 AM  [facility] [address] [place], [place], [address] Phone: [phone]   Fax:  [phone]  Name: MIRKA BARBONE MRN: [id number] Date of Birth: Nov 17, 1965

## 2020-12-20 ENCOUNTER — Ambulatory Visit (HOSPITAL_BASED_OUTPATIENT_CLINIC_OR_DEPARTMENT_OTHER): Payer: Medicare Other | Attending: Physician Assistant | Admitting: Physical Therapy

## 2020-12-20 ENCOUNTER — Other Ambulatory Visit: Payer: Self-pay

## 2020-12-20 DIAGNOSIS — R262 Difficulty in walking, not elsewhere classified: Secondary | ICD-10-CM | POA: Insufficient documentation

## 2020-12-20 DIAGNOSIS — M6281 Muscle weakness (generalized): Secondary | ICD-10-CM | POA: Diagnosis not present

## 2020-12-20 DIAGNOSIS — G8929 Other chronic pain: Secondary | ICD-10-CM | POA: Insufficient documentation

## 2020-12-20 DIAGNOSIS — M25661 Stiffness of right knee, not elsewhere classified: Secondary | ICD-10-CM | POA: Insufficient documentation

## 2020-12-20 DIAGNOSIS — M25561 Pain in right knee: Secondary | ICD-10-CM | POA: Insufficient documentation

## 2020-12-20 NOTE — Therapy (Signed)
[facility] [address] [place], [place], [address] Phone: [phone]   Fax:  [phone]  Physical Therapy Treatment  Patient Details  Name: Brenda Obrien MRN: [id number] Date of Birth: 03-18-1966 Referring Provider (PT): Berline Lopes, PA-C (Surgeon:  Dr. Corky Mull)   Encounter Date: 12/20/2020   PT End of Session - 12/20/20 0917     Visit Number 12    Number of Visits 12    Date for PT Re-Evaluation 12/04/20    Authorization Type Medicare and UHC    PT Start Time 0815    PT Stop Time 0900    PT Time Calculation (min) 45 min    Equipment Utilized During Treatment Other (comment)    Activity Tolerance Patient tolerated treatment well;Patient limited by pain    Behavior During Therapy Brenda Obrien for tasks assessed/performed             Past Medical History:  Diagnosis Date   Asthma    Cataract    Depression    Diabetes mellitus, type 2 ([place]) 02/2018   Elevated cholesterol    Endometriosis    Generalized anxiety disorder 2017   GERD (gastroesophageal reflux disease)    Glaucoma    HSV-1 infection 06/1998   Hypertension    PTSD (post-traumatic stress disorder) 2017    Past Surgical History:  Procedure Laterality Date   ANTERIOR CRUCIATE LIGAMENT REPAIR  1987,2001,2003,2005   BACK SURGERY  01/2017   due herniated disc   CERVICAL FUSION  10/21/2018   EYE SURGERY Bilateral 2016   release pressure    LAPAROSCOPIC CHOLECYSTECTOMY  5/14   [place]   LAPAROSCOPIC TOTAL HYSTERECTOMY  6/11   left knee surgery  09/2011   NASAL SINUS SURGERY  1992   REPLACEMENT TOTAL KNEE Left 9/14   Dr. Laurance Flatten   SHOULDER SURGERY Right 09/08/2015   due to injury    SHOULDER SURGERY Right 03/20/2016   due to injury   SHOULDER SURGERY Left 09/10/2017   due rotator cuff    There were no vitals filed for this visit.   Subjective Assessment - 12/20/20 0821     Subjective "I had 1 day of some relief after my last session.  Day  before had a massage, next day I had PT session which ended in the jacuzzi water massaging IT band.  Next day first day in long time, maybe a 3/5 all day.  Only thing really different was the jacuzzi"    Currently in Pain? Yes    Pain Score 4     Pain Orientation Right;Left    Pain Descriptors / Indicators Aching    Pain Radiating Towards Length of IT band.    Pain Onset More than a month ago                                     Upper Extremity Functional Index Score :   /80     PT Short Term Goals - 12/20/20 0919       PT SHORT TERM GOAL #1   Status On-going      PT SHORT TERM GOAL #2   Status Achieved      PT SHORT TERM GOAL #3   Status On-going      PT SHORT TERM GOAL #4   Status Achieved      PT SHORT TERM GOAL #5  Status On-going      PT SHORT TERM GOAL #6   Status On-going               PT Long Term Goals - 11/06/20 2226       PT LONG TERM GOAL #1   Title Pt will be able to perform her normal daily transfers without significant R knee pain or difficulty.    Time 6    Period Weeks    Status New    Target Date 12/18/20      PT LONG TERM GOAL #2   Title Pt will ambulate extended community distance without significant R knee pain or limitation from R knee.    Time 6    Period Weeks    Status New    Target Date 12/18/20      PT LONG TERM GOAL #3   Title Pt will demo improved R hip flex and abd strength and knee flex and ext MMT strength to 5/5 for improved tolerance to functional mobility skills.    Time 6    Period Weeks    Status New    Target Date 12/18/20      PT LONG TERM GOAL #4   Title Pt will be independent and compliant with a HEP for improved pain, ROM, strength, function, and mobility.    Time 6    Period Weeks    Status New    Target Date 12/18/20      PT LONG TERM GOAL #5   Title Pt will demo improved standing tolerance and perform her normal household chores and cleaning without signifiant R knee pain  or limitations from R knee.    Time 6    Period Weeks    Status New    Target Date 12/18/20            -Pt seen for aquatic therapy today.  Treatment took place in water 3.5-4.0 ft in depth at the [facility]. Temp of water was 94.  Pt entered/exited the pool via stairs with step to gait pattern with bilat rail. -Reviewed response to prior Rx, current function, HEP compliance, and pain level.   Warm up  Pt ambulated fwd, sidestepping  and backwards 3 laps.   Seated Gastroc, hamstring and add stretch 4-5 x 20 sec IT band stretch 5 x 25 second hold Knee flex ext 2 x 10 rep Sit to stand sitting on edge of pool chair x 10 reps  Standing   Submerged to waist     -marching 2 x 10 reps without ue support     -Hip flex;  IT band stretch; - abd/add, hip flex,and extension 2 x 10 reps each bilat           and standing heel raises x10 reps.      -partial squats x 10   Submerged to shoulder using 1 foam hand buoy     -horizontal add/abd 20  Attempted sit to stand from 3rd step as well as step ups submerged 80% but neither is tolerated.  Vertical suspension Straddling noodle 20 revolutions bicyclicing, scissoring and add/abd balancing with triangular hand buoys. Rest periods between.          Pt completes 2  widths of pool walking to stretch between exercises     Pt requires buoyancy for support and to offload joints with strengthening exercises. Viscosity of the water is needed for resistance of strengthening; water current perturbations provides challenge to standing  balance unsupported, requiring increased core activation.       Plan - 12/20/20 0823     Clinical Impression Statement Pt able to get some relief from IT band since early January last week. Attempted addition today of step ups submerged 80% which pt does not tolerate nor does she tolerate sit to stand from 3rd step. It is in error that she completed last visit (as per note).  Pt did report increased  discomfort/aggrevation of right knee after todays session. 2 STGs met, other 4 ongoing.  She began and ended session in [place] (unbilled) water massaging IT Band.    Personal Factors and Comorbidities Comorbidity 3+;Other    Examination-Activity Limitations Bed Mobility;Bend;Stand;Stairs;Squat;Sit;Locomotion Level;Transfers    PT Treatment/Interventions ADLs/Self Care Home Management;Aquatic Therapy;Cryotherapy;Electrical Stimulation;Moist Heat;Gait training;Stair training;Functional mobility training;Therapeutic activities;Therapeutic exercise;Balance training;Neuromuscular re-education;Manual techniques;Patient/family education    PT Next Visit Plan steps    PT Home Exercise Plan Rolling, stretching, icing, massaging IB band management    Consulted and Agree with Plan of Care Patient             Patient will benefit from skilled therapeutic intervention in order to improve the following deficits and impairments:  Decreased activity tolerance, Abnormal gait, Decreased mobility, Decreased endurance, Decreased range of motion, Decreased strength, Increased edema, Difficulty walking, Pain  Visit Diagnosis: Chronic pain of right knee  Difficulty in walking, not elsewhere classified  Muscle weakness (generalized)  Stiffness of right knee     Problem List Patient Active Problem List   Diagnosis Date Noted   Cardiac risk counseling 02/22/2019   Elevated liver function tests 02/22/2019   Well woman exam without gynecological exam 02/15/2019   Risk for coronary artery disease between 10% and 20% in next 10 years 02/15/2019   Cervical spondylosis with radiculopathy 08/24/2018   HLD (hyperlipidemia) 04/27/2018   Type 2 diabetes mellitus ([place]) 03/11/2018   Primary osteoarthritis of right knee 10/24/2017   Primary osteoarthritis of both hands 10/24/2017   Primary osteoarthritis of left hip 10/24/2017   DDD (degenerative disc disease), lumbar 11/18/2016   Severe episode of recurrent  major depressive disorder, without psychotic features ([place]) 03/15/2016   MDD (major depressive disorder), recurrent episode, severe ([place]) 03/15/2016   Insomnia 12/27/2015   Adhesive capsulitis of right shoulder 12/21/2015   Obesity, unspecified 09/20/2013   Carpal tunnel syndrome 09/20/2013   Allergic rhinitis 09/20/2013   H/O total knee replacement 01/25/2013   Depression 12/23/2012   Anxiety 03/13/2012   Hypertension 01/21/2012   Anterior cruciate ligament complete tear 07/01/2011   Tachycardia 11/22/2010   Dyslipidemia 11/22/2010   GERD 03/27/2009    Brenda Obrien MPT  12/20/2020, 9:23 AM  [facility] [address] [place], [place], [address] Phone: [phone]   Fax:  [phone]  Name: Brenda Obrien MRN: [id number] Date of Birth: Feb 12, 1966

## 2020-12-22 ENCOUNTER — Ambulatory Visit (HOSPITAL_BASED_OUTPATIENT_CLINIC_OR_DEPARTMENT_OTHER): Payer: Medicare Other | Admitting: Physical Therapy

## 2020-12-22 ENCOUNTER — Other Ambulatory Visit: Payer: Self-pay

## 2020-12-22 DIAGNOSIS — G8929 Other chronic pain: Secondary | ICD-10-CM

## 2020-12-22 DIAGNOSIS — R262 Difficulty in walking, not elsewhere classified: Secondary | ICD-10-CM

## 2020-12-22 DIAGNOSIS — M25561 Pain in right knee: Secondary | ICD-10-CM

## 2020-12-22 DIAGNOSIS — M6281 Muscle weakness (generalized): Secondary | ICD-10-CM

## 2020-12-22 DIAGNOSIS — M25661 Stiffness of right knee, not elsewhere classified: Secondary | ICD-10-CM

## 2020-12-22 NOTE — Therapy (Addendum)
[facility] [address] [place], [place], [address] Phone: [phone]   Fax:  [phone]  Physical Therapy Treatment  Patient Details  Name: Brenda Obrien MRN: [id number] Date of Birth: 09/11/1965 Referring Provider (PT): Eric Eck, PA-C (Surgeon:  Dr. John Hubbard)   Encounter Date: 12/22/2020   PT End of Session - 12/22/20 1434     Visit Number 13    Number of Visits 20    Date for PT Re-Evaluation 01/19/21    Authorization Type Medicare and UHC    Progress Note Due on Visit 20    PT Start Time 0815    PT Stop Time 0900    PT Time Calculation (min) 45 min    Equipment Utilized During Treatment Other (comment)    Activity Tolerance Patient tolerated treatment well;Patient limited by pain    Behavior During Therapy WFL for tasks assessed/performed             Past Medical History:  Diagnosis Date   Asthma    Cataract    Depression    Diabetes mellitus, type 2 ([facility]) 02/2018   Elevated cholesterol    Endometriosis    Generalized anxiety disorder 2017   GERD (gastroesophageal reflux disease)    Glaucoma    HSV-1 infection 06/1998   Hypertension    PTSD (post-traumatic stress disorder) 2017    Past Surgical History:  Procedure Laterality Date   ANTERIOR CRUCIATE LIGAMENT REPAIR  1987,2001,2003,2005   BACK SURGERY  01/2017   due herniated disc   CERVICAL FUSION  10/21/2018   EYE SURGERY Bilateral 2016   release pressure    LAPAROSCOPIC CHOLECYSTECTOMY  5/14   [place]   LAPAROSCOPIC TOTAL HYSTERECTOMY  6/11   left knee surgery  09/2011   NASAL SINUS SURGERY  1992   REPLACEMENT TOTAL KNEE Left 9/14   Dr. David Martin   SHOULDER SURGERY Right 09/08/2015   due to injury    SHOULDER SURGERY Right 03/20/2016   due to injury   SHOULDER SURGERY Left 09/10/2017   due rotator cuff    There were no vitals filed for this visit.   Subjective Assessment - 12/22/20 1334     Subjective "'IT band has been  better. 3-4/10 for past week,don't kne what changed"    Pain Relieving Factors massage, rest    Multiple Pain Sites Yes                [facility] PT Assessment - 12/22/20 0001       Assessment   Prior Therapy yes      AROM   AROM Assessment Site Knee    Right Knee Extension 0    Right Knee Flexion 118      PROM   Right Knee Flexion 116      Strength   Overall Strength Comments R knee ext 4/5,  Hip 5/5      Palpation   Palpation comment TTP bilat knee med and lateral, about patella      Ambulation/Gait   Gait Comments heel strike wfl                                      PT Short Term Goals - 12/22/20 0903       PT SHORT TERM GOAL #1   Baseline Pt has variable pain although now at it's lowest in last week.   3-4/10.  high 6/10+    Status Achieved      PT SHORT TERM GOAL #3   Baseline Full knee extension (0deg). Supine SLR right 5/5 without lag    Status Achieved      PT SHORT TERM GOAL #5   Baseline 12/22/20 Pt reporting amb through grocery store using cart with toleration > 30 mins with slight increase in pain    Status Achieved      PT SHORT TERM GOAL #6   Baseline 12-22-20 pt only able to tolerate/complete without pain submerged in pool 80%. Completed on land with spike in pain to 9/10. Pt limited completing modified due to pain.    Status Not Met               PT Long Term Goals - 12/22/20 1342       PT LONG TERM GOAL #1   Baseline With modified DME pt rising from commode with less pain.  New lower bed/matress being delivered today. Pt adding grab bars to bathroom.    Status Achieved      PT LONG TERM GOAL #2   Status On-going      PT LONG TERM GOAL #3   Title right hip flex/ abd 5/5, knee 4/5 limited due to pain    Status On-going      PT LONG TERM GOAL #4   Status Achieved      PT LONG TERM GOAL #5   Status On-going           -Pt seen for aquatic therapy today.  Treatment took place in water 3.5-4.0 ft in depth  at the [facility]. Temp of water was 94.  Pt entered/exited the pool via stairs with step to gait pattern with bilat rail. -Reviewed response to prior Rx, current function, HEP compliance, and pain level.   Warm up  Pt ambulated fwd, sidestepping  and backwards 3 laps.   Seated Gastroc, hamstring and add stretch 4-5 x 20 sec IT band stretch 5 x 25 second hold Knee flex ext 2 x 10 rep Sit to stand sitting on edge of pool chair x 10 reps   Standing   Submerged 75%     -marching 2 x 10 reps without ue support     -Hip flex;  IT band stretch; - abd/add, hip flex,and extension 2 x 10 reps each bilat           and standing heel raises x10 reps.      -partial squats x 10   Submerged to shoulder using 1 foam hand buoy     -horizontal add/abd 20   Attempted sit to stand from 3rd step as well as step ups submerged 80% but neither is tolerated.   Vertical suspension Straddling noodle 20 revolutions bicyclicing, scissoring and add/abd balancing with triangular hand buoys. Rest periods between.        Plan - 12/22/20 0843     Clinical Impression Statement Stair climbing on bottom water step not tolerated, increases pain in bilat knees which lingers.  Compleed today submerged 75% which is tolerated with improvement rising and lowering with rle.  Pt has improvement in overall pain which has lasted for 1 week. The addition of a massage therapist as well as water massage to IT band in [facility]uzzi (non-billed) has assisted. Pt has met 5/6 STG and 2/5 LTG. She continues to have challenges with amb tolerance and knee strength both limited due to pain.  Overall  pain has decreased from 8-9/10 to 3-4/10.  Recert completed.  Pt will benefit from continued aquatic therapy to meet all goals.    Clinical Decision Making Moderate    Rehab Potential Good    PT Frequency 2x / week    PT Duration 4 weeks    PT Treatment/Interventions ADLs/Self Care Home Management;Aquatic  Therapy;Cryotherapy;Electrical Stimulation;Moist Heat;Gait training;Stair training;Functional mobility training;Therapeutic activities;Therapeutic exercise;Balance training;Neuromuscular re-education;Manual techniques;Patient/family education    PT Next Visit Plan steps    PT Home Exercise Plan Rolling, stretching, icing, massaging IB band management             Patient will benefit from skilled therapeutic intervention in order to improve the following deficits and impairments:  Decreased activity tolerance, Abnormal gait, Decreased mobility, Decreased endurance, Decreased range of motion, Decreased strength, Increased edema, Difficulty walking, Pain  Visit Diagnosis: Chronic pain of right knee  Difficulty in walking, not elsewhere classified  Muscle weakness (generalized)  Stiffness of right knee     Problem List Patient Active Problem List   Diagnosis Date Noted   Cardiac risk counseling 02/22/2019   Elevated liver function tests 02/22/2019   Well woman exam without gynecological exam 02/15/2019   Risk for coronary artery disease between 10% and 20% in next 10 years 02/15/2019   Cervical spondylosis with radiculopathy 08/24/2018   HLD (hyperlipidemia) 04/27/2018   Type 2 diabetes mellitus ([facility]) 03/11/2018   Primary osteoarthritis of right knee 10/24/2017   Primary osteoarthritis of both hands 10/24/2017   Primary osteoarthritis of left hip 10/24/2017   DDD (degenerative disc disease), lumbar 11/18/2016   Severe episode of recurrent major depressive disorder, without psychotic features ([facility]) 03/15/2016   MDD (major depressive disorder), recurrent episode, severe ([facility]) 03/15/2016   Insomnia 12/27/2015   Adhesive capsulitis of right shoulder 12/21/2015   Obesity, unspecified 09/20/2013   Carpal tunnel syndrome 09/20/2013   Allergic rhinitis 09/20/2013   H/O total knee replacement 01/25/2013   Depression 12/23/2012   Anxiety 03/13/2012   Hypertension 01/21/2012    Anterior cruciate ligament complete tear 07/01/2011   Tachycardia 11/22/2010   Dyslipidemia 11/22/2010   GERD 03/27/2009  Progress Note Reporting Period 11/06/2020 to 12/22/20  See note below for Objective Data and Assessment of Progress/Goals.      Brenda Obrien, PT 12/22/2020, 2:46 PM  [facility] [address] [place], [place], [address] Phone: [phone]   Fax:  [phone]  Name: Brenda Obrien MRN: [id number] Date of Birth: 04/01/1966  Brenda Obrien MPT      

## 2020-12-22 NOTE — Therapy (Signed)
[facility] [address] [place], [place], [address] Phone: [phone]   Fax:  [phone]  Physical Therapy Treatment  Patient Details  Name: Brenda Obrien MRN: [id number] Date of Birth: May 12, 1965 Referring Provider (PT): Berline Lopes, PA-C (Surgeon:  Dr. Corky Mull)   Encounter Date: 12/22/2020   PT End of Session - 12/22/20 0830     Date for PT Re-Evaluation 12/18/20    PT Start Time 0815    PT Stop Time 0900    PT Time Calculation (min) 45 min    Equipment Utilized During Treatment Other (comment)    Activity Tolerance Patient tolerated treatment well;Patient limited by pain    Behavior During Therapy [facility] for tasks assessed/performed             Past Medical History:  Diagnosis Date   Asthma    Cataract    Depression    Diabetes mellitus, type 2 ([place]) 02/2018   Elevated cholesterol    Endometriosis    Generalized anxiety disorder 2017   GERD (gastroesophageal reflux disease)    Glaucoma    HSV-1 infection 06/1998   Hypertension    PTSD (post-traumatic stress disorder) 2017    Past Surgical History:  Procedure Laterality Date   ANTERIOR CRUCIATE LIGAMENT REPAIR  1987,2001,2003,2005   BACK SURGERY  01/2017   due herniated disc   CERVICAL FUSION  10/21/2018   EYE SURGERY Bilateral 2016   release pressure    LAPAROSCOPIC CHOLECYSTECTOMY  5/14   [place]   LAPAROSCOPIC TOTAL HYSTERECTOMY  6/11   left knee surgery  09/2011   NASAL SINUS SURGERY  1992   REPLACEMENT TOTAL KNEE Left 9/14   Dr. Laurance Flatten   SHOULDER SURGERY Right 09/08/2015   due to injury    SHOULDER SURGERY Right 03/20/2016   due to injury   SHOULDER SURGERY Left 09/10/2017   due rotator cuff    There were no vitals filed for this visit.   Subjective Assessment - 12/22/20 1334     Subjective "'IT band has been better. 3-4/10 for past week,don't kne what changed"    Pain Relieving Factors massage, rest    Multiple Pain Sites  Yes                [facility] PT Assessment - 12/22/20 0001       Assessment   Prior Therapy yes      AROM   AROM Assessment Site Knee    Right Knee Extension 0    Right Knee Flexion 118      PROM   Right Knee Flexion 116      Strength   Overall Strength Comments R knee ext 4/5,  Hip 5/5      Palpation   Palpation comment TTP bilat knee med and lateral, about patella      Ambulation/Gait   Gait Comments heel strike wfl                                      PT Short Term Goals - 12/22/20 0903       PT SHORT TERM GOAL #1   Baseline Pt has variable pain although now at it's lowest in last week. 3-4/10.  high 6/10+    Status Achieved      PT SHORT TERM GOAL #3   Baseline Full knee extension (0deg). Supine SLR right 5/5  without lag    Status Achieved      PT SHORT TERM GOAL #5   Baseline 12/22/20 Pt reporting amb through grocery store using cart with toleration > 30 mins with slight increase in pain    Status Achieved      PT SHORT TERM GOAL #6   Baseline 12-22-20 pt only able to tolerate/complete without pain submerged in pool 80%. Completed on land with spike in pain to 9/10. Pt limited completing modified due to pain.    Status Not Met               PT Long Term Goals - 12/22/20 1342       PT LONG TERM GOAL #1   Baseline With modified DME pt rising from commode with less pain.  New lower bed/matress being delivered today. Pt adding grab bars to bathroom.    Status Achieved      PT LONG TERM GOAL #2   Status On-going      PT LONG TERM GOAL #3   Title right hip flex/ abd 5/5, knee 4/5 limited due to pain    Status On-going      PT LONG TERM GOAL #4   Status Achieved      PT LONG TERM GOAL #5   Status On-going           -Pt seen for aquatic therapy today.  Treatment took place in water 3.5-4.0 ft in depth at the [facility]. Temp of water was 94.  Pt entered/exited the pool via stairs with step to gait  pattern with bilat rail. -Reviewed response to prior Rx, current function, HEP compliance, and pain level.   Warm up  Pt ambulated fwd, sidestepping  and backwards 3 laps.   Seated Gastroc, hamstring and add stretch 4-5 x 20 sec IT band stretch 5 x 25 second hold Knee flex ext 2 x 10 rep Sit to stand sitting on edge of pool chair x 10 reps   Standing   Submerged 75%     -marching 2 x 10 reps without ue support     -Hip flex;  IT band stretch; - abd/add, hip flex,and extension 2 x 10 reps each bilat           and standing heel raises x10 reps.      -partial squats x 10   Submerged to shoulder using 1 foam hand buoy     -horizontal add/abd 20   Attempted sit to stand from 3rd step as well as step ups submerged 80% but neither is tolerated.   Vertical suspension Straddling noodle 20 revolutions bicyclicing, scissoring and add/abd balancing with triangular hand buoys. Rest periods between.        Plan - 12/22/20 0843     Clinical Impression Statement Stair climbing on bottom water step not tolerated, increases pain in bilat knees which lingers.  Compleed today submerged 75% which is tolerated with improvement rising and lowering with rle.  Pt has improvement in overall pain which has lasted for 1 week. The addition of a massage therapist as well as water massage to IT band in Jacuzzi (non-billed) has assisted. Pt has met 5/6 STG and 2/5 LTG. She continues to have challenges with amb tolerance and knee strength both limited due to pain.  Overall pain has decreased from 8-9/10 to 3-4/10.  Recert completed.  Pt will benefit from continued aquatic therapy to meet all goals 1w4.    PT Treatment/Interventions ADLs/Self Care  Home Management;Aquatic Therapy;Cryotherapy;Electrical Stimulation;Moist Heat;Gait training;Stair training;Functional mobility training;Therapeutic activities;Therapeutic exercise;Balance training;Neuromuscular re-education;Manual techniques;Patient/family education     PT Next Visit Plan steps    PT Home Exercise Plan Rolling, stretching, icing, massaging IB band management             Patient will benefit from skilled therapeutic intervention in order to improve the following deficits and impairments:  Decreased activity tolerance, Abnormal gait, Decreased mobility, Decreased endurance, Decreased range of motion, Decreased strength, Increased edema, Difficulty walking, Pain  Visit Diagnosis: Chronic pain of right knee  Difficulty in walking, not elsewhere classified  Muscle weakness (generalized)  Stiffness of right knee     Problem List Patient Active Problem List   Diagnosis Date Noted   Cardiac risk counseling 02/22/2019   Elevated liver function tests 02/22/2019   Well woman exam without gynecological exam 02/15/2019   Risk for coronary artery disease between 10% and 20% in next 10 years 02/15/2019   Cervical spondylosis with radiculopathy 08/24/2018   HLD (hyperlipidemia) 04/27/2018   Type 2 diabetes mellitus ([place]) 03/11/2018   Primary osteoarthritis of right knee 10/24/2017   Primary osteoarthritis of both hands 10/24/2017   Primary osteoarthritis of left hip 10/24/2017   DDD (degenerative disc disease), lumbar 11/18/2016   Severe episode of recurrent major depressive disorder, without psychotic features ([place]) 03/15/2016   MDD (major depressive disorder), recurrent episode, severe ([place]) 03/15/2016   Insomnia 12/27/2015   Adhesive capsulitis of right shoulder 12/21/2015   Obesity, unspecified 09/20/2013   Carpal tunnel syndrome 09/20/2013   Allergic rhinitis 09/20/2013   H/O total knee replacement 01/25/2013   Depression 12/23/2012   Anxiety 03/13/2012   Hypertension 01/21/2012   Anterior cruciate ligament complete tear 07/01/2011   Tachycardia 11/22/2010   Dyslipidemia 11/22/2010   GERD 03/27/2009    Vedia Pereyra, PT 12/22/2020, 2:33 PM  [facility] [address] [place], [place], [address] Phone: [phone]   Fax:  [phone]  Name: Brenda Obrien MRN: [id number] Date of Birth: November 19, 1965

## 2020-12-22 NOTE — Addendum Note (Signed)
Addended by: Lynnette Caffey F on: 12/22/2020 02:48 PM   Modules accepted: Orders

## 2020-12-25 ENCOUNTER — Other Ambulatory Visit: Payer: Self-pay

## 2020-12-25 ENCOUNTER — Encounter (HOSPITAL_BASED_OUTPATIENT_CLINIC_OR_DEPARTMENT_OTHER): Payer: Self-pay | Admitting: Physical Therapy

## 2020-12-25 ENCOUNTER — Ambulatory Visit (HOSPITAL_BASED_OUTPATIENT_CLINIC_OR_DEPARTMENT_OTHER): Payer: Medicare Other | Admitting: Physical Therapy

## 2020-12-25 DIAGNOSIS — M25661 Stiffness of right knee, not elsewhere classified: Secondary | ICD-10-CM

## 2020-12-25 DIAGNOSIS — R262 Difficulty in walking, not elsewhere classified: Secondary | ICD-10-CM

## 2020-12-25 DIAGNOSIS — G8929 Other chronic pain: Secondary | ICD-10-CM

## 2020-12-25 DIAGNOSIS — M25561 Pain in right knee: Secondary | ICD-10-CM | POA: Diagnosis not present

## 2020-12-25 DIAGNOSIS — M6281 Muscle weakness (generalized): Secondary | ICD-10-CM

## 2020-12-25 NOTE — Therapy (Signed)
[facility] [address] [place], [place], [address] Phone: [phone]   Fax:  [phone]  Physical Therapy Treatment  Patient Details  Name: Brenda Obrien MRN: [id number] Date of Birth: 28-Oct-1965 Referring Provider (PT): Berline Lopes, PA-C (Surgeon:  Dr. Corky Mull)   Encounter Date: 12/25/2020   PT End of Session - 12/25/20 0902     Visit Number 14    Number of Visits 20    Date for PT Re-Evaluation 01/19/21    Authorization Type Medicare and [employer]HC    PT Start Time 0900    PT Stop Time 0940    PT Time Calculation (min) 40 min    Equipment Utilized During Treatment Other (comment)    Activity Tolerance Patient tolerated treatment well;Patient limited by pain    Behavior During Therapy [facility] for tasks assessed/performed             Past Medical History:  Diagnosis Date   Asthma    Cataract    Depression    Diabetes mellitus, type 2 ([place]) 02/2018   Elevated cholesterol    Endometriosis    Generalized anxiety disorder 2017   GERD (gastroesophageal reflux disease)    Glaucoma    HSV-1 infection 06/1998   Hypertension    PTSD (post-traumatic stress disorder) 2017    Past Surgical History:  Procedure Laterality Date   ANTERIOR CRUCIATE LIGAMENT REPAIR  1987,2001,2003,2005   BACK SURGERY  01/2017   due herniated disc   CERVICAL FUSION  10/21/2018   EYE SURGERY Bilateral 2016   release pressure    LAPAROSCOPIC CHOLECYSTECTOMY  5/14   [place], [place]   LAPAROSCOPIC TOTAL HYSTERECTOMY  6/11   left knee surgery  09/2011   NASAL SINUS SURGERY  1992   REPLACEMENT TOTAL KNEE Left 9/14   Dr. Laurance Flatten   SHOULDER SURGERY Right 09/08/2015   due to injury    SHOULDER SURGERY Right 03/20/2016   due to injury   SHOULDER SURGERY Left 09/10/2017   due rotator cuff    There were no vitals filed for this visit.   Subjective Assessment - 12/25/20 0906     Subjective "IT band about the same, calm"    Limitations  Sitting;Standing;Walking;House hold activities    Currently in Pain? Yes    Pain Radiating Towards 3/10                                          PT Short Term Goals - 12/22/20 0903       PT SHORT TERM GOAL #1   Baseline Pt has variable pain although now at it's lowest in last week. 3-4/10.  high 6/10+    Status Achieved      PT SHORT TERM GOAL #3   Baseline Full knee extension (0deg). Supine SLR right 5/5 without lag    Status Achieved      PT SHORT TERM GOAL #5   Baseline 12/22/20 Pt reporting amb through grocery store using cart with toleration > 30 mins with slight increase in pain    Status Achieved      PT SHORT TERM GOAL #6   Baseline 12-22-20 pt only able to tolerate/complete without pain submerged in pool 80%. Completed on land with spike in pain to 9/10. Pt limited completing modified due to pain.    Status Not Met  PT Long Term Goals - 12/22/20 1342       PT LONG TERM GOAL #1   Baseline With modified DME pt rising from commode with less pain.  New lower bed/matress being delivered today. Pt adding grab bars to bathroom.    Status Achieved      PT LONG TERM GOAL #2   Status On-going      PT LONG TERM GOAL #3   Title right hip flex/ abd 5/5, knee 4/5 limited due to pain    Status On-going      PT LONG TERM GOAL #4   Status Achieved      PT LONG TERM GOAL #5   Status On-going           -Pt seen for aquatic therapy today.  Treatment took place in water 3.5-4.0 ft in depth at the [facility]. Temp of water was 94.  Pt entered/exited the pool via stairs with step to gait pattern with bilat rail. -Reviewed response to prior Rx, current function, HEP compliance, and pain level.   Warm up  Pt ambulated fwd, sidestepping  and backwards 3 laps.   Seated Gastroc, hamstring and add stretch 4-5 x 20 sec IT band stretch 4 x 25 second hold Knee flex ext 2 x 10 rep Sit to stand sitting on edge of pool  chair x 10 reps Kicking at knee 2 x 10 then at hip 2 x10   Standing   Submerged 75%     -marching 2 x 10 reps without ue support     - abd/add, hip flex,and extension x 10 reps each bilat         -toe and heel raises x 10  reps.      -partial squats RLE x 10 _step ups submerged 75% leading with each LE 2 x 5.  C/o left knee pain > right.   Pt requires buoyancy for support and to offload joints with strengthening exercises. Viscosity of the water is needed for resistance of strengthening; water current perturbations provides challenge to standing balance unsupported, requiring increased core activation.        Plan - 12/25/20 1039     Clinical Impression Statement Advancing resistive RLE exefcises with good toleration.  Right IT band and knee with continued decreased pain improving her functional mobility as well as ability to sleep.  Left Knee limited > due to pain. Pt will continue with massage therapist, water massage and manual Massaging device(personally bought) as well as PT for optimal gains.    PT Treatment/Interventions ADLs/Self Care Home Management;Aquatic Therapy;Cryotherapy;Electrical Stimulation;Moist Heat;Gait training;Stair training;Functional mobility training;Therapeutic activities;Therapeutic exercise;Balance training;Neuromuscular re-education;Manual techniques;Patient/family education             Patient will benefit from skilled therapeutic intervention in order to improve the following deficits and impairments:  Decreased activity tolerance, Abnormal gait, Decreased mobility, Decreased endurance, Decreased range of motion, Decreased strength, Increased edema, Difficulty walking, Pain  Visit Diagnosis: Chronic pain of right knee  Difficulty in walking, not elsewhere classified  Muscle weakness (generalized)  Stiffness of right knee     Problem List Patient Active Problem List   Diagnosis Date Noted   Cardiac risk counseling 02/22/2019   Elevated  liver function tests 02/22/2019   Well woman exam without gynecological exam 02/15/2019   Risk for coronary artery disease between 10% and 20% in next 10 years 02/15/2019   Cervical spondylosis with radiculopathy 08/24/2018   HLD (hyperlipidemia) 04/27/2018   Type 2  diabetes mellitus ([place]) 03/11/2018   Primary osteoarthritis of right knee 10/24/2017   Primary osteoarthritis of both hands 10/24/2017   Primary osteoarthritis of left hip 10/24/2017   DDD (degenerative disc disease), lumbar 11/18/2016   Severe episode of recurrent major depressive disorder, without psychotic features ([place]) 03/15/2016   MDD (major depressive disorder), recurrent episode, severe ([place]) 03/15/2016   Insomnia 12/27/2015   Adhesive capsulitis of right shoulder 12/21/2015   Obesity, unspecified 09/20/2013   Carpal tunnel syndrome 09/20/2013   Allergic rhinitis 09/20/2013   H/O total knee replacement 01/25/2013   Depression 12/23/2012   Anxiety 03/13/2012   Hypertension 01/21/2012   Anterior cruciate ligament complete tear 07/01/2011   Tachycardia 11/22/2010   Dyslipidemia 11/22/2010   GERD 03/27/2009    Stanton Kidney (Frankie) Marquese Burkland MPT  12/25/2020, 11:40 AM  [facility] [address] [place], [place], [address] Phone: [phone]   Fax:  [phone]  Name: JOYIA RIEHLE MRN: [id number] Date of Birth: [birth year]

## 2020-12-28 ENCOUNTER — Encounter (HOSPITAL_BASED_OUTPATIENT_CLINIC_OR_DEPARTMENT_OTHER): Payer: Self-pay | Admitting: Physical Therapy

## 2020-12-28 ENCOUNTER — Other Ambulatory Visit: Payer: Self-pay

## 2020-12-28 ENCOUNTER — Ambulatory Visit (HOSPITAL_BASED_OUTPATIENT_CLINIC_OR_DEPARTMENT_OTHER): Payer: Medicare Other | Admitting: Physical Therapy

## 2020-12-28 DIAGNOSIS — M25561 Pain in right knee: Secondary | ICD-10-CM | POA: Diagnosis not present

## 2020-12-28 DIAGNOSIS — R262 Difficulty in walking, not elsewhere classified: Secondary | ICD-10-CM

## 2020-12-28 DIAGNOSIS — M6281 Muscle weakness (generalized): Secondary | ICD-10-CM

## 2020-12-28 DIAGNOSIS — M25661 Stiffness of right knee, not elsewhere classified: Secondary | ICD-10-CM

## 2020-12-28 DIAGNOSIS — G8929 Other chronic pain: Secondary | ICD-10-CM

## 2020-12-28 NOTE — Therapy (Signed)
[facility] [address] [place], [place], [address] Phone: [phone]   Fax:  [phone]  Physical Therapy Treatment  Patient Details  Name: Brenda Obrien MRN: [id number] Date of Birth: 1965-04-20 Referring Provider (PT): Berline Lopes, PA-C (Surgeon:  Dr. Corky Mull)   Encounter Date: 12/28/2020   PT End of Session - 12/28/20 0938     Visit Number 15    Number of Visits 21    Date for PT Re-Evaluation 01/19/21    Progress Note Due on Visit 21   or 01/19/21   PT Start Time 0853    PT Stop Time 0934    PT Time Calculation (min) 41 min    Activity Tolerance Patient tolerated treatment well;Patient limited by pain    Behavior During Therapy [facility] for tasks assessed/performed             Past Medical History:  Diagnosis Date   Asthma    Cataract    Depression    Diabetes mellitus, type 2 ([place]) 02/2018   Elevated cholesterol    Endometriosis    Generalized anxiety disorder 2017   GERD (gastroesophageal reflux disease)    Glaucoma    HSV-1 infection 06/1998   Hypertension    PTSD (post-traumatic stress disorder) 2017    Past Surgical History:  Procedure Laterality Date   ANTERIOR CRUCIATE LIGAMENT REPAIR  1987,2001,2003,2005   BACK SURGERY  01/2017   due herniated disc   CERVICAL FUSION  10/21/2018   EYE SURGERY Bilateral 2016   release pressure    LAPAROSCOPIC CHOLECYSTECTOMY  5/14   [place], [place]   LAPAROSCOPIC TOTAL HYSTERECTOMY  6/11   left knee surgery  09/2011   NASAL SINUS SURGERY  1992   REPLACEMENT TOTAL KNEE Left 9/14   Dr. Laurance Flatten   SHOULDER SURGERY Right 09/08/2015   due to injury    SHOULDER SURGERY Right 03/20/2016   due to injury   SHOULDER SURGERY Left 09/10/2017   due rotator cuff    There were no vitals filed for this visit.   Subjective Assessment - 12/28/20 0853     Subjective Pt states she is doing a little better having less pain in R knee and ITB.  "Today the knee and ITB are  a little iffy".  Pt states it's not a good day which may be due to the colder weather.  Pt reports she has improved with sit to stand transfers if the chair is not too low and car transfers. Pt has a lowered bed and is easier getting in/out of bed.  Pt continues to have difficulty with performing daily transfers.  pt is limited with ambulation due to knee pain and other pains including back and hips.  Pt is thankful from advice of the aquatic PT of putting grab bars in the shower and on the toilet.  It makes it much easier with her mobility.  Pt states she is getting a massage after today's Rx.    Pertinent History Hx of lumbar spasms.  ITB syndrome and Baker's cyst on R; Previous surgery including lateral release on R.  8 surgeries on L knee including L TKA.  pt had a patella fx after TKA and has difficulty with stairs/incline due to L knee.  Lumbar DDD and L5-S1 Lumbar microdiscectomy on 01/27/17, anterior cervical discectomy and fusion C5-C6 10/21/18.  OA in bilat hips and she states she needs a THR on R.  Bilat shoulder RCR.  DM, HSV-1.  Currently in Pain? Yes    Pain Score 5     Pain Location --   R knee and ITB            -Pt seen for aquatic therapy today.  Treatment took place in water 3.5-4.5 ft in depth at the [facility]. Temp of water was 94.  Pt entered/exited the pool via stairs with step to gait pattern with bilat rail. -Reviewed current function and pain level.   -Warm up  Pt ambulated fwd, sidestepping  and backwards 3 laps.   -Pt performed standing Gastroc stretch 2 x 20-30 sec bilat -Sit to stands 2 x 10 reps -Pt received R knee flexion and extension PROM seated f/b R knee extension manual stretch. -bicycles while straddling yellow noodle.   -Standing   Submerged 75%     -marching 2 x 10 reps without ue support     - abd/add and hip flex x 10 reps each bilat      -toe and heel raises x 10  reps.     -step ups submerged 75% leading with R LE 2 x 5.   Attempted L LE on step up though stopped due to pain.     Pt requires buoyancy for support and to offload joints with strengthening exercises. Viscosity of the water is needed for resistance of strengthening; water current perturbations provides challenge to standing balance unsupported, requiring increased core activation.           PT Education - 12/28/20 2026     Education Details instructed pt in correct form with aquatic exercises.  Answered pt's questions and educated pt in relevant anatomy.    Person(s) Educated Patient    Methods Explanation;Demonstration;Verbal cues    Comprehension Verbalized understanding;Returned demonstration              PT Short Term Goals - 12/22/20 0903       PT SHORT TERM GOAL #1   Baseline Pt has variable pain although now at it's lowest in last week. 3-4/10.  high 6/10+    Status Achieved      PT SHORT TERM GOAL #3   Baseline Full knee extension (0deg). Supine SLR right 5/5 without lag    Status Achieved      PT SHORT TERM GOAL #5   Baseline 12/22/20 Pt reporting amb through grocery store using cart with toleration > 30 mins with slight increase in pain    Status Achieved      PT SHORT TERM GOAL #6   Baseline 12-22-20 pt only able to tolerate/complete without pain submerged in pool 80%. Completed on land with spike in pain to 9/10. Pt limited completing modified due to pain.    Status Not Met               PT Long Term Goals - 12/22/20 1342       PT LONG TERM GOAL #1   Baseline With modified DME pt rising from commode with less pain.  New lower bed/matress being delivered today. Pt adding grab bars to bathroom.    Status Achieved      PT LONG TERM GOAL #2   Status On-going      PT LONG TERM GOAL #3   Title right hip flex/ abd 5/5, knee 4/5 limited due to pain    Status On-going      PT LONG TERM GOAL #4   Status Achieved      PT LONG TERM  GOAL #5   Status On-going                   Plan - 12/28/20 0945      Clinical Impression Statement Pt presents to Rx stating she is hurting a little more today which may be due to the weather, but overall she is feeling better.  Pt reports improved mobility in her home since lowering her bed and adding grab bars in the bathroom.  Stopped performing step ups with L LE due to pain though pt able to perform with R LE.  Pt performed aquatic exercises well though did c/o of tightness in R knee during Rx.  She gave good effort with all exercises.  PT performed PROM and manual extension stretch and Pt was tight in knee extension.  Pt responded well to Rx reporting no change in pain after Rx.  Pt should benefit from cont skilled PT services/aquatic therapy.    Comorbidities Hx of lumbar spasms.  8 surgeries on L knee including L TKA and a patella fx.  Lumbar DDD and L5-S1 Lumbar microdiscectomy on 01/27/17,  OA in bilat hips and she states she needs a THR on R.  DM.    PT Treatment/Interventions ADLs/Self Care Home Management;Aquatic Therapy;Cryotherapy;Electrical Stimulation;Moist Heat;Gait training;Stair training;Functional mobility training;Therapeutic activities;Therapeutic exercise;Balance training;Neuromuscular re-education;Manual techniques;Patient/family education    PT Next Visit Plan Cont with aquatic therapy    PT Home Exercise Plan Rolling, stretching, icing, massaging ITB band management    Consulted and Agree with Plan of Care Patient             Patient will benefit from skilled therapeutic intervention in order to improve the following deficits and impairments:  Decreased activity tolerance, Abnormal gait, Decreased mobility, Decreased endurance, Decreased range of motion, Decreased strength, Increased edema, Difficulty walking, Pain  Visit Diagnosis: Chronic pain of right knee  Difficulty in walking, not elsewhere classified  Muscle weakness (generalized)  Stiffness of right knee     Problem List Patient Active Problem List   Diagnosis Date  Noted   Cardiac risk counseling 02/22/2019   Elevated liver function tests 02/22/2019   Well woman exam without gynecological exam 02/15/2019   Risk for coronary artery disease between 10% and 20% in next 10 years 02/15/2019   Cervical spondylosis with radiculopathy 08/24/2018   HLD (hyperlipidemia) 04/27/2018   Type 2 diabetes mellitus ([place]) 03/11/2018   Primary osteoarthritis of right knee 10/24/2017   Primary osteoarthritis of both hands 10/24/2017   Primary osteoarthritis of left hip 10/24/2017   DDD (degenerative disc disease), lumbar 11/18/2016   Severe episode of recurrent major depressive disorder, without psychotic features ([place]) 03/15/2016   MDD (major depressive disorder), recurrent episode, severe ([place]) 03/15/2016   Insomnia 12/27/2015   Adhesive capsulitis of right shoulder 12/21/2015   Obesity, unspecified 09/20/2013   Carpal tunnel syndrome 09/20/2013   Allergic rhinitis 09/20/2013   H/O total knee replacement 01/25/2013   Depression 12/23/2012   Anxiety 03/13/2012   Hypertension 01/21/2012   Anterior cruciate ligament complete tear 07/01/2011   Tachycardia 11/22/2010   Dyslipidemia 11/22/2010   GERD 03/27/2009    Selinda Michaels III PT, DPT 12/28/20 8:47 PM   [facility] [address] [place], [place], [address] Phone: [phone]   Fax:  [phone]  Name: Brenda Obrien MRN: [id number] Date of Birth: 1965/11/29

## 2020-12-29 DIAGNOSIS — F411 Generalized anxiety disorder: Secondary | ICD-10-CM | POA: Diagnosis not present

## 2020-12-29 DIAGNOSIS — F331 Major depressive disorder, recurrent, moderate: Secondary | ICD-10-CM | POA: Diagnosis not present

## 2020-12-30 ENCOUNTER — Other Ambulatory Visit: Payer: Self-pay | Admitting: Internal Medicine

## 2021-01-01 ENCOUNTER — Other Ambulatory Visit: Payer: Self-pay

## 2021-01-01 ENCOUNTER — Encounter (HOSPITAL_BASED_OUTPATIENT_CLINIC_OR_DEPARTMENT_OTHER): Payer: Self-pay | Admitting: Physical Therapy

## 2021-01-01 ENCOUNTER — Ambulatory Visit (HOSPITAL_BASED_OUTPATIENT_CLINIC_OR_DEPARTMENT_OTHER): Payer: Medicare Other | Admitting: Physical Therapy

## 2021-01-01 DIAGNOSIS — R262 Difficulty in walking, not elsewhere classified: Secondary | ICD-10-CM

## 2021-01-01 DIAGNOSIS — M25661 Stiffness of right knee, not elsewhere classified: Secondary | ICD-10-CM

## 2021-01-01 DIAGNOSIS — M25561 Pain in right knee: Secondary | ICD-10-CM | POA: Diagnosis not present

## 2021-01-01 DIAGNOSIS — G8929 Other chronic pain: Secondary | ICD-10-CM

## 2021-01-01 DIAGNOSIS — M6281 Muscle weakness (generalized): Secondary | ICD-10-CM

## 2021-01-01 NOTE — Therapy (Signed)
[facility] [address] [place], [place], [address] Phone: [phone]   Fax:  [phone]  Physical Therapy Treatment  Patient Details  Name: Brenda Obrien MRN: [id number] Date of Birth: 1965/06/08 Referring Provider (PT): Berline Lopes, PA-C (Surgeon:  Dr. Corky Mull)   Encounter Date: 01/01/2021   PT End of Session - 01/01/21 0905     Visit Number 16    Number of Visits 21    Date for PT Re-Evaluation 01/19/21    Progress Note Due on Visit 21   or 01/19/21   PT Start Time 0902    PT Stop Time 0948    PT Time Calculation (min) 46 min    Activity Tolerance Patient tolerated treatment well;Patient limited by pain    Behavior During Therapy [facility] for tasks assessed/performed             Past Medical History:  Diagnosis Date   Asthma    Cataract    Depression    Diabetes mellitus, type 2 ([place]) 02/2018   Elevated cholesterol    Endometriosis    Generalized anxiety disorder 2017   GERD (gastroesophageal reflux disease)    Glaucoma    HSV-1 infection 06/1998   Hypertension    PTSD (post-traumatic stress disorder) 2017    Past Surgical History:  Procedure Laterality Date   ANTERIOR CRUCIATE LIGAMENT REPAIR  1987,2001,2003,2005   BACK SURGERY  01/2017   due herniated disc   CERVICAL FUSION  10/21/2018   EYE SURGERY Bilateral 2016   release pressure    LAPAROSCOPIC CHOLECYSTECTOMY  5/14   [place]   LAPAROSCOPIC TOTAL HYSTERECTOMY  6/11   left knee surgery  09/2011   NASAL SINUS SURGERY  1992   REPLACEMENT TOTAL KNEE Left 9/14   Dr. Laurance Flatten   SHOULDER SURGERY Right 09/08/2015   due to injury    SHOULDER SURGERY Right 03/20/2016   due to injury   SHOULDER SURGERY Left 09/10/2017   due rotator cuff    There were no vitals filed for this visit.   Subjective Assessment - 01/01/21 0922     Subjective "huting more today, I dont know why"    Limitations Sitting;Standing;Walking;House hold activities     Currently in Pain? Yes    Pain Score 5     Pain Location Leg    Pain Orientation Right    Pain Descriptors / Indicators Aching;Burning;Constant    Pain Type Chronic pain    Pain Onset More than a month ago    Pain Frequency Constant                                        PT Education - 01/01/21 1352     Education Details Peroneal nerve    Person(s) Educated Patient    Methods Explanation    Comprehension Verbalized understanding              PT Short Term Goals - 12/22/20 0903       PT SHORT TERM GOAL #1   Baseline Pt has variable pain although now at it's lowest in last week. 3-4/10.  high 6/10+    Status Achieved      PT SHORT TERM GOAL #3   Baseline Full knee extension (0deg). Supine SLR right 5/5 without lag    Status Achieved      PT SHORT TERM  GOAL #5   Baseline 12/22/20 Pt reporting amb through grocery store using cart with toleration > 30 mins with slight increase in pain    Status Achieved      PT SHORT TERM GOAL #6   Baseline 12-22-20 pt only able to tolerate/complete without pain submerged in pool 80%. Completed on land with spike in pain to 9/10. Pt limited completing modified due to pain.    Status Not Met               PT Long Term Goals - 12/22/20 1342       PT LONG TERM GOAL #1   Baseline With modified DME pt rising from commode with less pain.  New lower bed/matress being delivered today. Pt adding grab bars to bathroom.    Status Achieved      PT LONG TERM GOAL #2   Status On-going      PT LONG TERM GOAL #3   Title right hip flex/ abd 5/5, knee 4/5 limited due to pain    Status On-going      PT LONG TERM GOAL #4   Status Achieved      PT LONG TERM GOAL #5   Status On-going            -Pt seen for aquatic therapy today.  Treatment took place in water 3.5-4.5 ft in depth at the [facility]. Temp of water was 94.  Pt entered/exited the pool via stairs with step to gait pattern with bilat  rail. -Reviewed current function and pain level.   -Warm up  Pt ambulated fwd, sidestepping  and backwards 3 laps.   Standing -Pt performed standing Gastroc/hamstring stretch 2 x 20-30 sec bilat   Submerged 75%     -marching 2 x 10 reps without ue support     - abd/add x 10     - hip flex x 10 reps each bilat      -toe and heel raises x 5 reps. Increased right foot discomfort     -step ups submerged 90% leading with R LE 2 x 5.  Able to complete on left as well with increased submersion x 5     Pt requires buoyancy for support and to offload joints with strengthening exercises. Viscosity of the water is needed for resistance of strengthening; water current perturbations provides challenge to standing balance unsupported, requiring increased core activation.        Plan - 01/01/21 0930     Clinical Impression Statement Pt complaining of new pain in right peroneal nerve distribution right knee to foot 6/10.  Intensified over the w/e.  Lamonte Sakai been there on a much lower level. Inversiion and sup of foot increase buringing. Added back piriformis stretch in sitting.  Began discussion on potential dc encouraging silver sneaker use at [facility] for access to pool. Pt did not tolerate session today well.    PT Treatment/Interventions ADLs/Self Care Home Management;Aquatic Therapy;Cryotherapy;Electrical Stimulation;Moist Heat;Gait training;Stair training;Functional mobility training;Therapeutic activities;Therapeutic exercise;Balance training;Neuromuscular re-education;Manual techniques;Patient/family education    PT Next Visit Plan Cont with aquatic therapy    PT Home Exercise Plan Laminating HEP             Patient will benefit from skilled therapeutic intervention in order to improve the following deficits and impairments:  Decreased activity tolerance, Abnormal gait, Decreased mobility, Decreased endurance, Decreased range of motion, Decreased strength, Increased edema, Difficulty walking,  Pain  Visit Diagnosis: Chronic pain of right knee  Difficulty  in walking, not elsewhere classified  Muscle weakness (generalized)  Stiffness of right knee     Problem List Patient Active Problem List   Diagnosis Date Noted   Cardiac risk counseling 02/22/2019   Elevated liver function tests 02/22/2019   Well woman exam without gynecological exam 02/15/2019   Risk for coronary artery disease between 10% and 20% in next 10 years 02/15/2019   Cervical spondylosis with radiculopathy 08/24/2018   HLD (hyperlipidemia) 04/27/2018   Type 2 diabetes mellitus ([place]) 03/11/2018   Primary osteoarthritis of right knee 10/24/2017   Primary osteoarthritis of both hands 10/24/2017   Primary osteoarthritis of left hip 10/24/2017   DDD (degenerative disc disease), lumbar 11/18/2016   Severe episode of recurrent major depressive disorder, without psychotic features ([place]) 03/15/2016   MDD (major depressive disorder), recurrent episode, severe ([place]) 03/15/2016   Insomnia 12/27/2015   Adhesive capsulitis of right shoulder 12/21/2015   Obesity, unspecified 09/20/2013   Carpal tunnel syndrome 09/20/2013   Allergic rhinitis 09/20/2013   H/O total knee replacement 01/25/2013   Depression 12/23/2012   Anxiety 03/13/2012   Hypertension 01/21/2012   Anterior cruciate ligament complete tear 07/01/2011   Tachycardia 11/22/2010   Dyslipidemia 11/22/2010   GERD 03/27/2009    Stanton Kidney (Frankie) Ziemba MPT  01/01/2021, 2:02 PM  [facility] [address] [place], [place], [address] Phone: [phone]   Fax:  [phone]  Name: JANIE CAPP MRN: [id number] Date of Birth: 1965-05-18

## 2021-01-03 ENCOUNTER — Telehealth: Payer: Self-pay | Admitting: Internal Medicine

## 2021-01-03 MED ORDER — NEBIVOLOL HCL 5 MG PO TABS: 5.0000 mg | ORAL_TABLET | Freq: Every day | ORAL | 1 refills | Status: DC

## 2021-01-03 MED ORDER — SIMVASTATIN 40 MG PO TABS: 40.0000 mg | ORAL_TABLET | Freq: Every day | ORAL | 1 refills | Status: DC

## 2021-01-03 NOTE — Addendum Note (Signed)
Addended by: Kern Reap B on: 01/03/2021 05:02 PM   Modules accepted: Orders

## 2021-01-03 NOTE — Telephone Encounter (Signed)
PT called to advise she has questions about the following meds and would like a callback from the nurse to address them:  simvastatin (ZOCOR) 40 MG tablet: [facility] sent in a request for renewal and it was denied and she would like to know why  nebivolol (BYSTOLIC) 5 MG tablet: The second one they placed it inactive and they said they would send it in for a refill and have not heard anything

## 2021-01-03 NOTE — Telephone Encounter (Signed)
11/07/2020 12:03 PM EDT Back to Top    Spoke with patient and she does not wish to start a new statin at this time but will continue simvastatin and working on diet and exercise  Okay to refill simvastatin?  Lipitor was recommended by Dr Ardyth Harps.

## 2021-01-03 NOTE — Telephone Encounter (Signed)
Refills sent

## 2021-01-04 ENCOUNTER — Encounter (HOSPITAL_BASED_OUTPATIENT_CLINIC_OR_DEPARTMENT_OTHER): Payer: Self-pay | Admitting: Physical Therapy

## 2021-01-04 ENCOUNTER — Ambulatory Visit (HOSPITAL_BASED_OUTPATIENT_CLINIC_OR_DEPARTMENT_OTHER): Payer: Medicare Other | Admitting: Physical Therapy

## 2021-01-04 ENCOUNTER — Other Ambulatory Visit: Payer: Self-pay

## 2021-01-04 DIAGNOSIS — G8929 Other chronic pain: Secondary | ICD-10-CM

## 2021-01-04 DIAGNOSIS — M25561 Pain in right knee: Secondary | ICD-10-CM | POA: Diagnosis not present

## 2021-01-04 DIAGNOSIS — R262 Difficulty in walking, not elsewhere classified: Secondary | ICD-10-CM

## 2021-01-04 DIAGNOSIS — M25661 Stiffness of right knee, not elsewhere classified: Secondary | ICD-10-CM

## 2021-01-04 DIAGNOSIS — M6281 Muscle weakness (generalized): Secondary | ICD-10-CM

## 2021-01-04 NOTE — Therapy (Signed)
[facility] [address] [place], [place], [address] Phone: [phone]   Fax:  [phone]  Physical Therapy Treatment  Patient Details  Name: Brenda Obrien MRN: [id number] Date of Birth: 1966-02-22 Referring Provider (PT): Berline Lopes, PA-C (Surgeon:  Dr. Corky Mull)   Encounter Date: 01/04/2021   PT End of Session - 01/04/21 0902     Visit Number 17    Number of Visits 21    Date for PT Re-Evaluation 01/19/21    Progress Note Due on Visit 21    PT Start Time 0852    PT Stop Time 0937    PT Time Calculation (min) 45 min    Equipment Utilized During Treatment Other (comment)   yellow noodle   Activity Tolerance Patient tolerated treatment well    Behavior During Therapy [facility] for tasks assessed/performed             Past Medical History:  Diagnosis Date   Asthma    Cataract    Depression    Diabetes mellitus, type 2 ([place]) 02/2018   Elevated cholesterol    Endometriosis    Generalized anxiety disorder 2017   GERD (gastroesophageal reflux disease)    Glaucoma    HSV-1 infection 06/1998   Hypertension    PTSD (post-traumatic stress disorder) 2017    Past Surgical History:  Procedure Laterality Date   ANTERIOR CRUCIATE LIGAMENT REPAIR  1987,2001,2003,2005   BACK SURGERY  01/2017   due herniated disc   CERVICAL FUSION  10/21/2018   EYE SURGERY Bilateral 2016   release pressure    LAPAROSCOPIC CHOLECYSTECTOMY  5/14   [place], [place]   LAPAROSCOPIC TOTAL HYSTERECTOMY  6/11   left knee surgery  09/2011   NASAL SINUS SURGERY  1992   REPLACEMENT TOTAL KNEE Left 9/14   Dr. Laurance Flatten   SHOULDER SURGERY Right 09/08/2015   due to injury    SHOULDER SURGERY Right 03/20/2016   due to injury   SHOULDER SURGERY Left 09/10/2017   due rotator cuff    There were no vitals filed for this visit.   Subjective Assessment - 01/04/21 0857     Subjective Pt states she was having R sided lumbar pain and pain down R LE prior  to last appt and pain just worsened t/o the day.  Now she is having pain down L LE.  Pt states she has pain traveling to her ankle which has never happened before. "I think it's a nerve".  Pt reports she is having pain in medial R knee and behind patella.  Pt states she feels about the same and reports no recent functional improvements.  Her ITB is doing better overall and she has a massage appt after PT today.    Pertinent History Hx of lumbar spasms.  ITB syndrome and Baker's cyst on R; Previous surgery including lateral release on R.  8 surgeries on L knee including L TKA.  pt had a patella fx after TKA and has difficulty with stairs/incline due to L knee.  Lumbar DDD and L5-S1 Lumbar microdiscectomy on 01/27/17, anterior cervical discectomy and fusion C5-C6 10/21/18.  OA in bilat hips and she states she needs a THR on R.  Bilat shoulder RCR.  DM, HSV-1.    Currently in Pain? Yes    Pain Score --   3-4/10 pain   Pain Location Knee    Pain Orientation Right               -  Pt seen for aquatic therapy today.  Treatment took place in water 3.5-4.5 ft in depth at the [facility]. Temp of water was 96.  Pt entered/exited the pool via stairs with step to gait pattern with bilat rail. -Reviewed current function, pt presentation, and pain level.   -Pt ambulated fwd, sidestepped  and ambulated backwards.   -Pt performed standing Gastroc stretch 2 x 20-30 sec bilat -Sit to stands 2 x 10 reps -Pt received R knee flexion and extension PROM seated f/b R knee extension manual stretch. -bicycles while straddling yellow noodle.   -Standing   Submerged 75%     -marching 2 x 10 reps without ue support     - abd/add and hip flex x 10 reps each bilat      -heel raises x 10  reps.     Pt requires buoyancy for support and to offload joints with strengthening exercises. Viscosity of the water is needed for resistance of strengthening; water current perturbations provides challenge to  standing balance unsupported, requiring increased core activation.           PT Education - 01/04/21 1545     Education Details instructed pt in correct form with aquatic exercises.    Person(s) Educated Patient    Methods Explanation;Verbal cues;Demonstration    Comprehension Verbalized understanding;Returned demonstration              PT Short Term Goals - 12/22/20 0903       PT SHORT TERM GOAL #1   Baseline Pt has variable pain although now at it's lowest in last week. 3-4/10.  high 6/10+    Status Achieved      PT SHORT TERM GOAL #3   Baseline Full knee extension (0deg). Supine SLR right 5/5 without lag    Status Achieved      PT SHORT TERM GOAL #5   Baseline 12/22/20 Pt reporting amb through grocery store using cart with toleration > 30 mins with slight increase in pain    Status Achieved      PT SHORT TERM GOAL #6   Baseline 12-22-20 pt only able to tolerate/complete without pain submerged in pool 80%. Completed on land with spike in pain to 9/10. Pt limited completing modified due to pain.    Status Not Met               PT Long Term Goals - 12/22/20 1342       PT LONG TERM GOAL #1   Baseline With modified DME pt rising from commode with less pain.  New lower bed/matress being delivered today. Pt adding grab bars to bathroom.    Status Achieved      PT LONG TERM GOAL #2   Status On-going      PT LONG TERM GOAL #3   Title right hip flex/ abd 5/5, knee 4/5 limited due to pain    Status On-going      PT LONG TERM GOAL #4   Status Achieved      PT LONG TERM GOAL #5   Status On-going                   Plan - 01/04/21 1547     Clinical Impression Statement Pt presents to Rx reporting pain traveling from back down LEs.  She states this pain was on the R LE though now is traveling down L LE.  pt has more c/o's of back and LE pain than knee  pain.  Pt seems to be having improved pain and sx's in ITB and has been treating ITB comprehensively.   Pt did have occasions of pain during Rx.  Pt continues to have limited ROM including extension and flexion.  She had pain with end range flexion PROM.  Pt gave good effort with wall exercises.  She reported no change in pain after Rx.    Comorbidities Hx of lumbar spasms.  8 surgeries on L knee including L TKA and a patella fx.  Lumbar DDD and L5-S1 Lumbar microdiscectomy on 01/27/17,  OA in bilat hips and she states she needs a THR on R.  DM.    PT Treatment/Interventions ADLs/Self Care Home Management;Aquatic Therapy;Cryotherapy;Electrical Stimulation;Moist Heat;Gait training;Stair training;Functional mobility training;Therapeutic activities;Therapeutic exercise;Balance training;Neuromuscular re-education;Manual techniques;Patient/family education    PT Next Visit Plan Cont with aquatic therapy    PT Home Exercise Plan Laminating HEP    Consulted and Agree with Plan of Care Patient             Patient will benefit from skilled therapeutic intervention in order to improve the following deficits and impairments:  Decreased activity tolerance, Abnormal gait, Decreased mobility, Decreased endurance, Decreased range of motion, Decreased strength, Increased edema, Difficulty walking, Pain  Visit Diagnosis: Chronic pain of right knee  Difficulty in walking, not elsewhere classified  Muscle weakness (generalized)  Stiffness of right knee     Problem List Patient Active Problem List   Diagnosis Date Noted   Cardiac risk counseling 02/22/2019   Elevated liver function tests 02/22/2019   Well woman exam without gynecological exam 02/15/2019   Risk for coronary artery disease between 10% and 20% in next 10 years 02/15/2019   Cervical spondylosis with radiculopathy 08/24/2018   HLD (hyperlipidemia) 04/27/2018   Type 2 diabetes mellitus ([place]) 03/11/2018   Primary osteoarthritis of right knee 10/24/2017   Primary osteoarthritis of both hands 10/24/2017   Primary osteoarthritis of left hip  10/24/2017   DDD (degenerative disc disease), lumbar 11/18/2016   Severe episode of recurrent major depressive disorder, without psychotic features ([place]) 03/15/2016   MDD (major depressive disorder), recurrent episode, severe ([place]) 03/15/2016   Insomnia 12/27/2015   Adhesive capsulitis of right shoulder 12/21/2015   Obesity, unspecified 09/20/2013   Carpal tunnel syndrome 09/20/2013   Allergic rhinitis 09/20/2013   H/O total knee replacement 01/25/2013   Depression 12/23/2012   Anxiety 03/13/2012   Hypertension 01/21/2012   Anterior cruciate ligament complete tear 07/01/2011   Tachycardia 11/22/2010   Dyslipidemia 11/22/2010   GERD 03/27/2009    Selinda Michaels III PT, DPT 01/04/21 4:03 PM   [facility] [address] [place], [place], [address] Phone: [phone]   Fax:  [phone]  Name: Brenda Obrien MRN: [id number] Date of Birth: 11-Nov-1965

## 2021-01-08 ENCOUNTER — Ambulatory Visit (HOSPITAL_BASED_OUTPATIENT_CLINIC_OR_DEPARTMENT_OTHER): Payer: Medicare Other | Admitting: Physical Therapy

## 2021-01-10 ENCOUNTER — Encounter (HOSPITAL_BASED_OUTPATIENT_CLINIC_OR_DEPARTMENT_OTHER): Payer: Medicare Other | Admitting: Physical Therapy

## 2021-01-10 DIAGNOSIS — F331 Major depressive disorder, recurrent, moderate: Secondary | ICD-10-CM | POA: Diagnosis not present

## 2021-01-10 DIAGNOSIS — F411 Generalized anxiety disorder: Secondary | ICD-10-CM | POA: Diagnosis not present

## 2021-01-15 DIAGNOSIS — Z1211 Encounter for screening for malignant neoplasm of colon: Secondary | ICD-10-CM | POA: Diagnosis not present

## 2021-01-17 ENCOUNTER — Encounter (HOSPITAL_BASED_OUTPATIENT_CLINIC_OR_DEPARTMENT_OTHER): Payer: Self-pay | Admitting: Physical Therapy

## 2021-01-17 ENCOUNTER — Ambulatory Visit (HOSPITAL_BASED_OUTPATIENT_CLINIC_OR_DEPARTMENT_OTHER): Payer: Medicare Other | Attending: Physician Assistant | Admitting: Physical Therapy

## 2021-01-17 ENCOUNTER — Other Ambulatory Visit: Payer: Self-pay

## 2021-01-17 DIAGNOSIS — M25561 Pain in right knee: Secondary | ICD-10-CM | POA: Insufficient documentation

## 2021-01-17 DIAGNOSIS — M25661 Stiffness of right knee, not elsewhere classified: Secondary | ICD-10-CM | POA: Diagnosis not present

## 2021-01-17 DIAGNOSIS — R262 Difficulty in walking, not elsewhere classified: Secondary | ICD-10-CM | POA: Insufficient documentation

## 2021-01-17 DIAGNOSIS — M6281 Muscle weakness (generalized): Secondary | ICD-10-CM | POA: Insufficient documentation

## 2021-01-17 DIAGNOSIS — G8929 Other chronic pain: Secondary | ICD-10-CM | POA: Insufficient documentation

## 2021-01-17 NOTE — Therapy (Signed)
[facility] [address] [place], [place], [address] Phone: [phone]   Fax:  [phone]  Physical Therapy Treatment  Patient Details  Name: Brenda Obrien MRN: [id number] Date of Birth: 02-09-1966 Referring Provider (PT): Berline Lopes, PA-C (Surgeon:  Dr. Corky Mull)   Encounter Date: 01/17/2021   PT End of Session - 01/17/21 0821     Visit Number 18    Number of Visits 21    Date for PT Re-Evaluation 01/19/21    Authorization Type Medicare and UHC    PT Start Time 0810    PT Stop Time 0850    PT Time Calculation (min) 40 min    Activity Tolerance Patient tolerated treatment well    Behavior During Therapy [facility] for tasks assessed/performed             Past Medical History:  Diagnosis Date   Asthma    Cataract    Depression    Diabetes mellitus, type 2 ([place]) 02/2018   Elevated cholesterol    Endometriosis    Generalized anxiety disorder 2017   GERD (gastroesophageal reflux disease)    Glaucoma    HSV-1 infection 06/1998   Hypertension    PTSD (post-traumatic stress disorder) 2017    Past Surgical History:  Procedure Laterality Date   ANTERIOR CRUCIATE LIGAMENT REPAIR  1987,2001,2003,2005   BACK SURGERY  01/2017   due herniated disc   CERVICAL FUSION  10/21/2018   EYE SURGERY Bilateral 2016   release pressure    LAPAROSCOPIC CHOLECYSTECTOMY  5/14   [place]   LAPAROSCOPIC TOTAL HYSTERECTOMY  6/11   left knee surgery  09/2011   NASAL SINUS SURGERY  1992   REPLACEMENT TOTAL KNEE Left 9/14   Dr. Laurance Flatten   SHOULDER SURGERY Right 09/08/2015   due to injury    SHOULDER SURGERY Right 03/20/2016   due to injury   SHOULDER SURGERY Left 09/10/2017   due rotator cuff    There were no vitals filed for this visit.   Subjective Assessment - 01/17/21 0814     Subjective Pt states she continues to have R sided lumbar pain and pain down R LE and pain in mid calf to foot on L LE.  Pt reports she is having  pain in medial R knee and behind patella.  Pt states she feels about the same and reports no recent functional improvements.  Pt sees massage therapist once per week which has helped her ITB.  Pt cont to have constant pain.  Pt reports improved strength in bilat LEs.  Pt continues to have difficulty with transfers and reports no improvement.  Pt has pain and difficulty with stairs and reports no improvement.  Pt has improved mobility in her home due to having grab bars installed in the B/R and also lowering her bed.  Pt states her walking is the same and she is limited with ambulation distance.  Pt states she knows all of her exercises she just has to find a place to use the pool to perform her aquatic exercises.  Pt states she has to find a place that has a warm pool and is affordable.    Pertinent History Hx of lumbar spasms.  ITB syndrome and Baker's cyst on R; Previous surgery including lateral release on R.  8 surgeries on L knee including L TKA.  pt had a patella fx after TKA and has difficulty with stairs/incline due to L knee.  Lumbar DDD and  L5-S1 Lumbar microdiscectomy on 01/27/17, anterior cervical discectomy and fusion C5-C6 10/21/18.  OA in bilat hips and she states she needs a THR on R.  Bilat shoulder RCR.  DM, HSV-1.    How long can you stand comfortably? 15-20 mins    How long can you walk comfortably? limited    Currently in Pain? Yes    Pain Score 4    7-8/10 worst, 3/10 best   Pain Location Knee    Pain Orientation Right                [facility] PT Assessment - 01/17/21 0001       Observation/Other Assessments   Other Surveys  Lower Extremity Functional Scale    Lower Extremity Functional Scale  17/80   1:2, 2:0, 3:1, 4:3, 5:2, 6:0, 7 and 8: 2, 9:0, 10:3, 11-19:0, 20:2     AROM   AROM Assessment Site Knee    Right/Left Knee Right    Right Knee Extension 0    Right Knee Flexion 104      PROM   Right Knee Flexion 114      Strength   Strength Assessment Site Hip;Knee     Right/Left Hip Right    Right Hip Flexion 4+/5   with hip pain   Right/Left Knee Right    Right Knee Flexion --   Pt tolerates good resistance with knee flexion in sitting 4+/5   Right Knee Extension 4/5   with knee pain     Palpation   Palpation comment TTP:  medial knee, central patella, lateral knee, distal lateral R quad.  Much improved tenderness at ITB      Ambulation/Gait   Gait Comments Pt ambulated with a heel to toe gait pattern favoring R LE.  Improved heel strike and toe off.                                    PT Education - 01/17/21 0846     Education Details Educated pt concerning objective findings, progress, and lack of progress.  Pt states she understands and knows her aquatic exercises, but has to find a place that is affordable and has a warm pool.  Educated pt concerning discharge.    Person(s) Educated Patient    Methods Explanation    Comprehension Verbalized understanding              PT Short Term Goals - 01/17/21 0822       PT SHORT TERM GOAL #1   Title Pt wil report worst pain to be no greater than 6-7/10 for improved tolerance to activity.    Baseline 7-8/10 worst pain    Time 3    Period Weeks    Status Partially Met    Target Date 11/27/20      PT SHORT TERM GOAL #5   Title Pt will ambulate in the grocery store without significant R knee pain.    Time 3    Period Weeks    Status Not Met    Target Date 11/27/20               PT Long Term Goals - 01/17/21 0824       PT LONG TERM GOAL #1   Title Pt will be able to perform her normal daily transfers without significant R knee pain or difficulty.    Baseline uses modified DME  at home though continues to have much pain and difficulty with sit/stand transfers    Time 6    Period Weeks    Status Partially Met    Target Date 12/18/20      PT LONG TERM GOAL #2   Title Pt will ambulate extended community distance without significant R knee pain or limitation  from R knee.    Time 6    Period Weeks    Status Not Met    Target Date 12/18/20      PT LONG TERM GOAL #3   Title Pt will demo improved R hip flex and abd strength and knee flex and ext MMT strength to 5/5 for improved tolerance to functional mobility skills.    Time 6    Period Weeks    Status Not Met    Target Date 12/18/20      PT LONG TERM GOAL #5   Title Pt will demo improved standing tolerance and perform her normal household chores and cleaning without signifiant R knee pain or limitations from R knee.    Period Weeks    Status Not Met    Target Date 12/18/20                   Plan - 01/17/21 0839     Clinical Impression Statement Pt continues to have constant pain in knee and also has pain in other areas which affect her mobility.  Pt seems to be having improved pain and sx's in ITB and has been treating ITB comprehensively.  Pt states she feels about the same and reports no recent functional improvements.  Pt is not making progress with functional mobility including continued pain and difficulty with stairs, ambulation, and transfers.  Pt does have improved mobility in her home due to having modifications made.  She continues to be limited with ambulation distance.  She is limited with performing household chores reporting no progress.  Pt has good knee extension AROM though worse knee flexion ROM.  Pt continues to have strength deficits in R knee worse in extension than flexion.  She was weaker in R hip flexion today.   Pt has improved in quality of gait overall.  Pt is not progressing toward goals.  She will be discharged from PT due to lack of functional progress and progress toward goals.    Comorbidities Hx of lumbar spasms.  8 surgeries on L knee including L TKA and a patella fx.  Lumbar DDD and L5-S1 Lumbar microdiscectomy on 01/27/17,  OA in bilat hips and she states she needs a THR on R.  DM.    PT Treatment/Interventions ADLs/Self Care Home Management;Aquatic  Therapy;Cryotherapy;Electrical Stimulation;Moist Heat;Gait training;Stair training;Functional mobility training;Therapeutic activities;Therapeutic exercise;Balance training;Neuromuscular re-education;Manual techniques;Patient/family education    PT Next Visit Plan Pt to discharged from skilled PT services due to lack of progress.  Pt is agreeable with discharge.  Pt is looking for a pool to perform her aquatic exercises.    Consulted and Agree with Plan of Care Patient             Patient will benefit from skilled therapeutic intervention in order to improve the following deficits and impairments:  Decreased activity tolerance, Abnormal gait, Decreased mobility, Decreased endurance, Decreased range of motion, Decreased strength, Increased edema, Difficulty walking, Pain  Visit Diagnosis: Chronic pain of right knee  Difficulty in walking, not elsewhere classified  Muscle weakness (generalized)  Stiffness of right knee     Problem  List Patient Active Problem List   Diagnosis Date Noted   Cardiac risk counseling 02/22/2019   Elevated liver function tests 02/22/2019   Well woman exam without gynecological exam 02/15/2019   Risk for coronary artery disease between 10% and 20% in next 10 years 02/15/2019   Cervical spondylosis with radiculopathy 08/24/2018   HLD (hyperlipidemia) 04/27/2018   Type 2 diabetes mellitus ([place]) 03/11/2018   Primary osteoarthritis of right knee 10/24/2017   Primary osteoarthritis of both hands 10/24/2017   Primary osteoarthritis of left hip 10/24/2017   DDD (degenerative disc disease), lumbar 11/18/2016   Severe episode of recurrent major depressive disorder, without psychotic features ([place]) 03/15/2016   MDD (major depressive disorder), recurrent episode, severe ([place]) 03/15/2016   Insomnia 12/27/2015   Adhesive capsulitis of right shoulder 12/21/2015   Obesity, unspecified 09/20/2013   Carpal tunnel syndrome 09/20/2013   Allergic rhinitis 09/20/2013    H/O total knee replacement 01/25/2013   Depression 12/23/2012   Anxiety 03/13/2012   Hypertension 01/21/2012   Anterior cruciate ligament complete tear 07/01/2011   Tachycardia 11/22/2010   Dyslipidemia 11/22/2010   GERD 03/27/2009   PHYSICAL THERAPY DISCHARGE SUMMARY  Visits from Start of Care: 18  Current functional level related to goals / functional outcomes: See above   Remaining deficits: See above   Education / Equipment: See above   Patient agrees to discharge. Patient goals were not met. Patient is being discharged due to lack of progress.  Pt is agreeable with discharge.  Pt is searching for a pool that is affordable and has heated water to perform her aquatic exercises.  Selinda Michaels III PT, DPT 01/17/21 9:29 PM   [facility] [address] [place], [place], [address] Phone: [phone]   Fax:  [phone]  Name: Brenda Obrien MRN: [id number] Date of Birth: 1965-11-14

## 2021-01-19 DIAGNOSIS — Z9889 Other specified postprocedural states: Secondary | ICD-10-CM | POA: Diagnosis not present

## 2021-01-19 DIAGNOSIS — M1711 Unilateral primary osteoarthritis, right knee: Secondary | ICD-10-CM | POA: Diagnosis not present

## 2021-01-19 DIAGNOSIS — G8929 Other chronic pain: Secondary | ICD-10-CM | POA: Diagnosis not present

## 2021-01-19 DIAGNOSIS — Z4789 Encounter for other orthopedic aftercare: Secondary | ICD-10-CM | POA: Diagnosis not present

## 2021-01-19 DIAGNOSIS — M25561 Pain in right knee: Secondary | ICD-10-CM | POA: Diagnosis not present

## 2021-01-19 LAB — COLOGUARD: Cologuard: NEGATIVE

## 2021-01-26 DIAGNOSIS — F331 Major depressive disorder, recurrent, moderate: Secondary | ICD-10-CM | POA: Diagnosis not present

## 2021-01-26 DIAGNOSIS — F411 Generalized anxiety disorder: Secondary | ICD-10-CM | POA: Diagnosis not present

## 2021-01-27 ENCOUNTER — Other Ambulatory Visit: Payer: Self-pay | Admitting: Internal Medicine

## 2021-01-27 DIAGNOSIS — E1165 Type 2 diabetes mellitus with hyperglycemia: Secondary | ICD-10-CM

## 2021-01-29 ENCOUNTER — Telehealth: Payer: Self-pay

## 2021-01-29 NOTE — Telephone Encounter (Signed)
Patient called asking if Rx refill can be sent to the pharmacy pt is scheduled to take Rx on 10/19 pt is asking for a speedy response TRULICITY 0.75 MG/0.5ML SOPN

## 2021-01-30 NOTE — Telephone Encounter (Signed)
Sent by Dr Ardyth Harps

## 2021-02-02 ENCOUNTER — Encounter: Payer: Self-pay | Admitting: Internal Medicine

## 2021-02-02 ENCOUNTER — Other Ambulatory Visit: Payer: Self-pay

## 2021-02-02 ENCOUNTER — Other Ambulatory Visit: Payer: Self-pay | Admitting: Internal Medicine

## 2021-02-02 ENCOUNTER — Ambulatory Visit (INDEPENDENT_AMBULATORY_CARE_PROVIDER_SITE_OTHER): Payer: Medicare Other | Admitting: Internal Medicine

## 2021-02-02 VITALS — BP 120/84 | HR 87 | Temp 98.3°F | Wt 223.9 lb

## 2021-02-02 DIAGNOSIS — E1169 Type 2 diabetes mellitus with other specified complication: Secondary | ICD-10-CM

## 2021-02-02 DIAGNOSIS — Z23 Encounter for immunization: Secondary | ICD-10-CM

## 2021-02-02 DIAGNOSIS — E785 Hyperlipidemia, unspecified: Secondary | ICD-10-CM

## 2021-02-02 DIAGNOSIS — I10 Essential (primary) hypertension: Secondary | ICD-10-CM | POA: Diagnosis not present

## 2021-02-02 LAB — POCT GLYCOSYLATED HEMOGLOBIN (HGB A1C): Hemoglobin A1C: 6.5 % — AB (ref 4.0–5.6)

## 2021-02-02 LAB — LIPID PANEL
Cholesterol: 205 mg/dL — ABNORMAL HIGH (ref 0–200)
HDL: 45.6 mg/dL (ref >39.00)
LDL Cholesterol: 132 mg/dL — ABNORMAL HIGH (ref 0–99)
NonHDL: 159.47
Total CHOL/HDL Ratio: 4
Triglycerides: 135 mg/dL (ref 0.0–149.0)
VLDL: 27 mg/dL (ref 0.0–40.0)

## 2021-02-02 MED ORDER — EZETIMIBE 10 MG PO TABS: 10.0000 mg | ORAL_TABLET | Freq: Every day | ORAL | 1 refills | Status: DC

## 2021-02-02 NOTE — Progress Notes (Signed)
Established Patient Office Visit     This visit occurred during the SARS-CoV-2 public health emergency.  Safety protocols were in place, including screening questions prior to the visit, additional usage of staff PPE, and extensive cleaning of exam room while observing appropriate contact time as indicated for disinfecting solutions.    CC/Reason for Visit: 65-monthfollow-up chronic medical conditions  HPI: Brenda FINFROCKis a 55y.o. female who is coming in today for the above mentioned reasons. Past Medical History is significant for: Type 2 diabetes, hypertension, hyperlipidemia, morbid obesity, GERD.  She has no acute concerns today, she is requesting her flu vaccine.   Past Medical/Surgical History: Past Medical History:  Diagnosis Date   Asthma    Cataract    Depression    Diabetes [place] 2 ([place] 02/2018   Elevated cholesterol    Endometriosis    Generalized anxiety disorder 2017   GERD (gastroesophageal reflux disease)    Glaucoma    HSV-1 infection 06/1998   Hypertension    PTSD (post-traumatic stress disorder) 2017    Past Surgical History:  Procedure Laterality Date   ANTERIOR CRUCIATE LIGAMENT REPAIR  1987,2001,2003,2005   BACK SURGERY  01/2017   due herniated disc   CERVICAL FUSION  10/21/2018   EYE SURGERY Bilateral 2016   release pressure    [place]IC [place]  LAPAROSCOPIC TOTAL HYSTERECTOMY  6/11   left knee surgery  09/2011   NASAL SINUS SURGERY  1992   REPLACEMENT TOTAL KNEE Left 9/14   Dr. DLaurance Flatten  SHOULDER SURGERY Right 09/08/2015   due to injury    SHOULDER SURGERY Right 03/20/2016   due to injury   SHOULDER SURGERY Left 09/10/2017   due rotator cuff    Social History:  reports that she has been smoking cigarettes. She has been smoking an average of 1 pack per day. She has never used smokeless tobacco. She reports current alcohol use of about 14.0 - 21.0 standard drinks per week. She reports  that she does not use drugs.  Allergies: No Known Allergies  Family History:  Family History  Problem Relation Age of Onset   Coronary artery disease Mother        PAD   Heart attack Mother 55  Diabetes Mother    Lung cancer Father    Heart attack Father 339  Hypertension Brother    High Cholesterol Brother      Current Outpatient Medications:    Blood Glucose Monitoring Suppl (CONTOUR MONITOR) w/Device KIT, 1 Device by Does not apply route daily. Patient is to test once daily. Dx E11.9, Disp: 1 kit, Rfl: 1   diclofenac sodium (VOLTAREN) 1 % GEL, Apply 3 gm to 3 large joints up to 3 times a day.Dispense 3 tubes with 3 refills., Disp: 5 Tube, Rfl: 0   glucose blood (CONTOUR NEXT TEST) test strip, Use to check sugar daily Dx E11.9, Disp: 100 strip, Rfl: 3   lisinopril (ZESTRIL) 10 MG tablet, TAKE 1 TABLET BY MOUTH EVERY DAY, Disp: 30 tablet, Rfl: 5   metFORMIN (GLUCOPHAGE) 1000 MG tablet, TAKE 1 TABLET (1,000 MG TOTAL) BY MOUTH 2 (TWO) TIMES DAILY WITH A MEAL., Disp: 60 tablet, Rfl: 5   Microlet Lancets MISC, USE AS DIRECTED EVERY DAY, Disp: 100 each, Rfl: 11   nebivolol (BYSTOLIC) 5 MG tablet, Take 1 tablet (5 mg total) by mouth daily., Disp: 90 tablet, Rfl: 1  omeprazole (PRILOSEC) 40 MG capsule, TAKE 1 CAPSULE BY MOUTH EVERY DAY, Disp: 30 capsule, Rfl: 5   scopolamine (TRANSDERM-SCOP) 1 MG/3DAYS, Place 1 patch (1.5 mg total) onto the skin every 3 (three) days., Disp: 4 patch, Rfl: 2   simvastatin (ZOCOR) 40 MG tablet, Take 1 tablet (40 mg total) by mouth at bedtime., Disp: 90 tablet, Rfl: 1   timolol (BETIMOL) 0.25 % ophthalmic solution, 1-2 drops 2 (two) times daily., Disp: , Rfl:    TRULICITY 5.62 ZH/0.8MV SOPN, INJECT 0.75 MG INTO THE SKIN ONCE A WEEK., Disp: 6 mL, Rfl: 0  Review of Systems:  Constitutional: Denies fever, chills, diaphoresis, appetite change and fatigue.  HEENT: Denies photophobia, eye pain, redness, hearing loss, ear pain, congestion, sore throat,  rhinorrhea, sneezing, mouth sores, trouble swallowing, neck pain, neck stiffness and tinnitus.   Respiratory: Denies SOB, DOE, cough, chest tightness,  and wheezing.   Cardiovascular: Denies chest pain, palpitations and leg swelling.  Gastrointestinal: Denies nausea, vomiting, abdominal pain, diarrhea, constipation, blood in stool and abdominal distention.  Genitourinary: Denies dysuria, urgency, frequency, hematuria, flank pain and difficulty urinating.  Endocrine: Denies: hot or cold intolerance, sweats, changes in hair or nails, polyuria, polydipsia. Musculoskeletal: Denies myalgias, back pain, joint swelling and gait problem.  Skin: Denies pallor, rash and wound.  Neurological: Denies dizziness, seizures, syncope, weakness, light-headedness, numbness and headaches.  Hematological: Denies adenopathy. Easy bruising, personal or family bleeding history  Psychiatric/Behavioral: Denies suicidal ideation, mood changes, confusion, nervousness, sleep disturbance and agitation    Physical Exam: Vitals:   02/02/21 0810  BP: 120/84  Pulse: 87  Temp: 98.3 F (36.8 C)  TempSrc: Oral  SpO2: 97%  Weight: 223 lb 14.4 oz (101.6 kg)    Body mass index is 36.14 kg/m.   Constitutional: NAD, calm, comfortable Eyes: PERRL, lids and conjunctivae normal, wears corrective lenses ENMT: Mucous membranes are moist.  Respiratory: clear to auscultation bilaterally, no wheezing, no crackles. Normal respiratory effort. No accessory muscle use.  Cardiovascular: Regular rate and rhythm, no murmurs / rubs / gallops. No extremity edema.  Neurologic: Grossly intact and nonfocal Psychiatric: Normal judgment and insight. Alert and oriented x 3. Normal mood.    Impression and Plan:  Type 2 diabetes mellitus with other specified complication, without long-term current use of insulin ([place])  - Plan: POCT glycosylated hemoglobin (Hb A1C) -A1c demonstrates excellent control at 6.5, continue metformin and  Trulicity.  Hyperlipidemia associated with type 2 diabetes mellitus ([place])  - Plan: Lipid panel -In July 2022 total cholesterol was 185, triglycerides 106, LDL 116. -She is currently on simvastatin 40 mg daily. -Goal LDL is less than 70, recheck lipids today, could consider addition of ezetimibe.  Primary hypertension -Well-controlled on Bystolic, lisinopril.  Morbid obesity ([place]) -Discussed healthy lifestyle, including increased physical activity and better food choices to promote weight loss. -She has been congratulated on her weight loss success thus far, she has lost an additional 3 pounds since her last visit.  Need for influenza vaccination -Flu vaccine administered today.  Time spent: 32 minutes reviewing chart, interviewing and examining patient and formulating plan of care.    Lelon Frohlich, MD [facility]

## 2021-02-07 ENCOUNTER — Other Ambulatory Visit: Payer: Self-pay | Admitting: Internal Medicine

## 2021-02-07 DIAGNOSIS — E785 Hyperlipidemia, unspecified: Secondary | ICD-10-CM

## 2021-02-07 DIAGNOSIS — F331 Major depressive disorder, recurrent, moderate: Secondary | ICD-10-CM | POA: Diagnosis not present

## 2021-02-07 DIAGNOSIS — F411 Generalized anxiety disorder: Secondary | ICD-10-CM | POA: Diagnosis not present

## 2021-02-07 NOTE — Progress Notes (Signed)
Refer lipid

## 2021-02-15 DIAGNOSIS — F411 Generalized anxiety disorder: Secondary | ICD-10-CM | POA: Diagnosis not present

## 2021-02-15 DIAGNOSIS — F331 Major depressive disorder, recurrent, moderate: Secondary | ICD-10-CM | POA: Diagnosis not present

## 2021-02-22 ENCOUNTER — Other Ambulatory Visit: Payer: Self-pay | Admitting: Internal Medicine

## 2021-02-22 DIAGNOSIS — E1165 Type 2 diabetes mellitus with hyperglycemia: Secondary | ICD-10-CM

## 2021-02-22 DIAGNOSIS — K219 Gastro-esophageal reflux disease without esophagitis: Secondary | ICD-10-CM

## 2021-03-27 DIAGNOSIS — M1711 Unilateral primary osteoarthritis, right knee: Secondary | ICD-10-CM | POA: Diagnosis not present

## 2021-03-27 DIAGNOSIS — M25561 Pain in right knee: Secondary | ICD-10-CM | POA: Diagnosis not present

## 2021-03-27 DIAGNOSIS — F17210 Nicotine dependence, cigarettes, uncomplicated: Secondary | ICD-10-CM | POA: Diagnosis not present

## 2021-03-27 DIAGNOSIS — Z4789 Encounter for other orthopedic aftercare: Secondary | ICD-10-CM | POA: Diagnosis not present

## 2021-03-27 DIAGNOSIS — G8929 Other chronic pain: Secondary | ICD-10-CM | POA: Diagnosis not present

## 2021-03-27 DIAGNOSIS — Z9889 Other specified postprocedural states: Secondary | ICD-10-CM | POA: Diagnosis not present

## 2021-04-17 ENCOUNTER — Other Ambulatory Visit: Payer: Self-pay | Admitting: Internal Medicine

## 2021-04-17 DIAGNOSIS — E1165 Type 2 diabetes mellitus with hyperglycemia: Secondary | ICD-10-CM

## 2021-04-18 ENCOUNTER — Telehealth: Payer: Self-pay | Admitting: Internal Medicine

## 2021-04-18 DIAGNOSIS — E1165 Type 2 diabetes mellitus with hyperglycemia: Secondary | ICD-10-CM

## 2021-04-18 MED ORDER — TRULICITY 0.75 MG/0.5ML ~~LOC~~ SOAJ
0.7500 mg | SUBCUTANEOUS | 0 refills | Status: DC
Start: 2021-04-18 — End: 2021-07-05

## 2021-04-18 NOTE — Telephone Encounter (Signed)
Pt has an appt with md on 05-07-2021 and would like a refill on  TRULICITY [id number] SOPN .  Pt took her last shot on 04-18-2021 [id number] [place], [place] Phone:  [phone]  Fax:  [phone]

## 2021-05-04 ENCOUNTER — Encounter: Payer: Self-pay | Admitting: Internal Medicine

## 2021-05-07 ENCOUNTER — Ambulatory Visit (INDEPENDENT_AMBULATORY_CARE_PROVIDER_SITE_OTHER): Payer: Medicare Other | Admitting: Internal Medicine

## 2021-05-07 VITALS — BP 170/90 | HR 91 | Temp 98.4°F | Wt 228.0 lb

## 2021-05-07 DIAGNOSIS — F339 Major depressive disorder, recurrent, unspecified: Secondary | ICD-10-CM | POA: Diagnosis not present

## 2021-05-07 DIAGNOSIS — I10 Essential (primary) hypertension: Secondary | ICD-10-CM

## 2021-05-07 DIAGNOSIS — E1169 Type 2 diabetes mellitus with other specified complication: Secondary | ICD-10-CM | POA: Diagnosis not present

## 2021-05-07 DIAGNOSIS — E785 Hyperlipidemia, unspecified: Secondary | ICD-10-CM

## 2021-05-07 DIAGNOSIS — E1165 Type 2 diabetes mellitus with hyperglycemia: Secondary | ICD-10-CM

## 2021-05-07 DIAGNOSIS — E782 Mixed hyperlipidemia: Secondary | ICD-10-CM

## 2021-05-07 LAB — POCT GLYCOSYLATED HEMOGLOBIN (HGB A1C): Hemoglobin A1C: 6.2 % — AB (ref 4.0–5.6)

## 2021-05-07 MED ORDER — LISINOPRIL 20 MG PO TABS: 20.0000 mg | ORAL_TABLET | Freq: Every day | ORAL | 1 refills | Status: DC

## 2021-05-07 MED ORDER — EZETIMIBE 10 MG PO TABS: 10.0000 mg | ORAL_TABLET | Freq: Every day | ORAL | 1 refills | Status: DC

## 2021-05-07 NOTE — Patient Instructions (Signed)
-  Nice seeing you today!!  -Increase lisinopril to 20 mg daily.  -Start ezetimibe 10 mg daily.  -Schedule follow up in 6 weeks.

## 2021-05-07 NOTE — Progress Notes (Signed)
Established Patient Office Visit     This visit occurred during the SARS-CoV-2 public health emergency.  Safety protocols were in place, including screening questions prior to the visit, additional usage of staff PPE, and extensive cleaning of exam room while observing appropriate contact time as indicated for disinfecting solutions.    CC/Reason for Visit: Follow-up chronic conditions  HPI: Brenda Obrien is a 56 y.o. female who is coming in today for the above mentioned reasons. Past Medical History is significant for: Hypertension, hyperlipidemia, type 2 diabetes, morbid obesity, GERD.  She has been under a lot of stressors that she is not able/willing to explain today.  She has a history of depression/anxiety but is not on medication, is not interested in CBT.  Her blood pressure is very elevated in office today with 2 separate measurements of 200/102, 170/90.  She declines headache, chest pain, shortness of breath, focal neurologic deficits.  She has been taking lisinopril 10 mg and nebivolol 5 mg.  She did not start taking ezetimibe in addition to her statin as we had discussed at last visit as she has concerns about increasing muscle/joint aches.   Past Medical/Surgical History: Past Medical History:  Diagnosis Date   Asthma    Cataract    Depression    Diabetes mellitus, type 2 ([place]) 02/2018   Elevated cholesterol    Endometriosis    Generalized anxiety disorder 2017   GERD (gastroesophageal reflux disease)    Glaucoma    HSV-1 infection 06/1998   Hypertension    PTSD (post-traumatic stress disorder) 2017    Past Surgical History:  Procedure Laterality Date   ANTERIOR CRUCIATE LIGAMENT REPAIR  1987,2001,2003,2005   BACK SURGERY  01/2017   due herniated disc   CERVICAL FUSION  10/21/2018   EYE SURGERY Bilateral 2016   release pressure    LAPAROSCOPIC CHOLECYSTECTOMY  5/14   [place], [place]   LAPAROSCOPIC TOTAL HYSTERECTOMY  6/11   left knee surgery  09/2011   NASAL  SINUS SURGERY  1992   REPLACEMENT TOTAL KNEE Left 9/14   Dr. Laurance Flatten   SHOULDER SURGERY Right 09/08/2015   due to injury    SHOULDER SURGERY Right 03/20/2016   due to injury   SHOULDER SURGERY Left 09/10/2017   due rotator cuff    Social History:  reports that she has been smoking cigarettes. She has been smoking an average of 1 pack per day. She has never used smokeless tobacco. She reports current alcohol use of about 14.0 - 21.0 standard drinks per week. She reports that she does not use drugs.  Allergies: No Known Allergies  Family History:  Family History  Problem Relation Age of Onset   Coronary artery disease Mother        PAD   Heart attack Mother 78   Diabetes Mother    Lung cancer Father    Heart attack Father 32   Hypertension Brother    High Cholesterol Brother      Current Outpatient Medications:    Blood Glucose Monitoring Suppl (CONTOUR MONITOR) w/Device KIT, 1 Device by Does not apply route daily. Patient is to test once daily. Dx E11.9, Disp: 1 kit, Rfl: 1   diclofenac sodium (VOLTAREN) 1 % GEL, Apply 3 gm to 3 large joints up to 3 times a day.Dispense 3 tubes with 3 refills., Disp: 5 Tube, Rfl: 0   Dulaglutide (TRULICITY) 0.09 FG/1.8EX SOPN, Inject 0.75 mg into the skin once a week., Disp:  6 mL, Rfl: 0   glucose blood (CONTOUR NEXT TEST) test strip, Use to check sugar daily Dx E11.9, Disp: 100 strip, Rfl: 3   metFORMIN (GLUCOPHAGE) 1000 MG tablet, TAKE 1 TABLET (1,000 MG TOTAL) BY MOUTH 2 (TWO) TIMES DAILY WITH A MEAL., Disp: 180 tablet, Rfl: 1   Microlet Lancets MISC, USE AS DIRECTED EVERY DAY, Disp: 100 each, Rfl: 11   nebivolol (BYSTOLIC) 5 MG tablet, Take 1 tablet (5 mg total) by mouth daily., Disp: 90 tablet, Rfl: 1   omeprazole (PRILOSEC) 40 MG capsule, TAKE 1 CAPSULE BY MOUTH EVERY DAY, Disp: 90 capsule, Rfl: 1   scopolamine (TRANSDERM-SCOP) 1 MG/3DAYS, Place 1 patch (1.5 mg total) onto the skin every 3 (three) days., Disp: 4 patch, Rfl: 2    simvastatin (ZOCOR) 40 MG tablet, Take 1 tablet (40 mg total) by mouth at bedtime., Disp: 90 tablet, Rfl: 1   timolol (BETIMOL) 0.25 % ophthalmic solution, 1-2 drops 2 (two) times daily., Disp: , Rfl:    ezetimibe (ZETIA) 10 MG tablet, Take 1 tablet (10 mg total) by mouth daily., Disp: 90 tablet, Rfl: 1   lisinopril (ZESTRIL) 20 MG tablet, Take 1 tablet (20 mg total) by mouth daily., Disp: 90 tablet, Rfl: 1  Review of Systems:  Constitutional: Denies fever, chills, diaphoresis, appetite change and fatigue.  HEENT: Denies photophobia, eye pain, redness, hearing loss, ear pain, congestion, sore throat, rhinorrhea, sneezing, mouth sores, trouble swallowing, neck pain, neck stiffness and tinnitus.   Respiratory: Denies SOB, DOE, cough, chest tightness,  and wheezing.   Cardiovascular: Denies chest pain, palpitations and leg swelling.  Gastrointestinal: Denies nausea, vomiting, abdominal pain, diarrhea, constipation, blood in stool and abdominal distention.  Genitourinary: Denies dysuria, urgency, frequency, hematuria, flank pain and difficulty urinating.  Endocrine: Denies: hot or cold intolerance, sweats, changes in hair or nails, polyuria, polydipsia. Musculoskeletal: Denies myalgias, back pain, joint swelling, arthralgias and gait problem.  Skin: Denies pallor, rash and wound.  Neurological: Denies dizziness, seizures, syncope, weakness, light-headedness, numbness and headaches.  Hematological: Denies adenopathy. Easy bruising, personal or family bleeding history  Psychiatric/Behavioral: Denies suicidal ideation,  confusion, nervousness, sleep disturbance and agitation    Physical Exam: Vitals:   05/07/21 0756  BP: (!) 170/90  Pulse: 91  Temp: 98.4 F (36.9 C)  TempSrc: Oral  SpO2: 96%  Weight: 228 lb (103.4 kg)    Body mass index is 36.8 kg/m.   Constitutional: NAD, calm, comfortable Eyes: PERRL, lids and conjunctivae normal ENMT: Mucous membranes are moist.  Respiratory:  clear to auscultation bilaterally, no wheezing, no crackles. Normal respiratory effort. No accessory muscle use.  Cardiovascular: Regular rate and rhythm, no murmurs / rubs / gallops. No extremity edema. Psychiatric: Normal judgment and insight. Alert and oriented x 3. Normal mood.    Impression and Plan:   Primary hypertension  - Plan: lisinopril (ZESTRIL) 20 MG tablet -Blood pressure is uncontrolled, continue nebivolol 5 mg, increase lisinopril from 10 to 20 mg daily. -Return in 4 to 6 weeks for follow-up.  Type 2 diabetes mellitus with other specified complication, without long-term current use of insulin (HCC) -A1c demonstrates good control at 6.2.  Mixed hyperlipidemia  Episode of recurrent major depressive disorder, unspecified depression episode severity ([place]) [place] Visit from 05/07/2021 in [facility] at [place]  PHQ-9 Total Score 10      -We discussed medication and CBT but she declines at this time.  Dyslipidemia  - Plan: ezetimibe (ZETIA) 10 MG tablet -Advise she start  ezetimibe in addition to simvastatin given recent cholesterol panel showing a total cholesterol of 205 and LDL 132.  Time spent: 32 minutes reviewing chart, interviewing and examining patient and formulating plan of care.   Patient Instructions  -Nice seeing you today!!  -Increase lisinopril to 20 mg daily.  -Start ezetimibe 10 mg daily.  -Schedule follow up in 6 weeks.    Lelon Frohlich, MD [facility] at [facility]

## 2021-06-18 ENCOUNTER — Ambulatory Visit (INDEPENDENT_AMBULATORY_CARE_PROVIDER_SITE_OTHER): Payer: Medicare Other | Admitting: Internal Medicine

## 2021-06-18 ENCOUNTER — Encounter: Payer: Self-pay | Admitting: Internal Medicine

## 2021-06-18 VITALS — BP 158/98 | HR 88 | Temp 98.4°F | Wt 226.7 lb

## 2021-06-18 DIAGNOSIS — I10 Essential (primary) hypertension: Secondary | ICD-10-CM | POA: Diagnosis not present

## 2021-06-18 NOTE — Progress Notes (Signed)
? ? ? ?Established Patient Office Visit ? ? ? ? ?This visit occurred during the SARS-CoV-2 public health emergency.  Safety protocols were in place, including screening questions prior to the visit, additional usage of staff PPE, and extensive cleaning of exam room while observing appropriate contact time as indicated for disinfecting solutions.  ? ? ?CC/Reason for Visit: Follow-up blood pressure ? ?HPI: Brenda Obrien is a 56 y.o. female who is coming in today for the above mentioned reasons. Past Medical History is significant for: Hypertension, hyperlipidemia, type 2 diabetes, morbid obesity, GERD.  At last visit her lisinopril was increased from 10 to 20 mg in addition to being on nebivolol 5 mg daily for blood pressure.  She is here today for follow-up.  She does not bring ambulatory measurements but tells me that her 2 most recent home blood pressures were 139/89 and 127/78.  She is hesitant to further increase blood pressure regimen. ? ? ?Past Medical/Surgical History: ?Past Medical History:  ?Diagnosis Date  ? Asthma   ? Cataract   ? Depression   ? Diabetes mellitus, type 2 ([place]) 02/2018  ? Elevated cholesterol   ? Endometriosis   ? Generalized anxiety disorder 2017  ? GERD (gastroesophageal reflux disease)   ? Glaucoma   ? HSV-1 infection 06/1998  ? Hypertension   ? PTSD (post-traumatic stress disorder) 2017  ? ? ?Past Surgical History:  ?Procedure Laterality Date  ? ANTERIOR CRUCIATE LIGAMENT REPAIR  1987,2001,2003,2005  ? BACK SURGERY  01/2017  ? due herniated disc  ? CERVICAL FUSION  10/21/2018  ? EYE SURGERY Bilateral 2016  ? release pressure   ? LAPAROSCOPIC CHOLECYSTECTOMY  5/14  ? [place], [place]  ? LAPAROSCOPIC TOTAL HYSTERECTOMY  6/11  ? left knee surgery  09/2011  ? NASAL SINUS SURGERY  1992  ? REPLACEMENT TOTAL KNEE Left 9/14  ? Dr. Laurance Flatten  ? SHOULDER SURGERY Right 09/08/2015  ? due to injury   ? SHOULDER SURGERY Right 03/20/2016  ? due to injury  ? SHOULDER SURGERY Left 09/10/2017  ? due rotator  cuff  ? ? ?Social History: ? reports that she has been smoking cigarettes. She has been smoking an average of 1 pack per day. She has never used smokeless tobacco. She reports current alcohol use of about 14.0 - 21.0 standard drinks per week. She reports that she does not use drugs. ? ?Allergies: ?No Known Allergies ? ?Family History:  ?Family History  ?Problem Relation Age of Onset  ? Coronary artery disease Mother   ?     PAD  ? Heart attack Mother 35  ? Diabetes Mother   ? Lung cancer Father   ? Heart attack Father 30  ? Hypertension Brother   ? High Cholesterol Brother   ? ? ? ?Current Outpatient Medications:  ?  Blood Glucose Monitoring Suppl (CONTOUR MONITOR) w/Device KIT, 1 Device by Does not apply route daily. Patient is to test once daily. Dx E11.9, Disp: 1 kit, Rfl: 1 ?  diclofenac sodium (VOLTAREN) 1 % GEL, Apply 3 gm to 3 large joints up to 3 times a day.Dispense 3 tubes with 3 refills., Disp: 5 Tube, Rfl: 0 ?  Dulaglutide (TRULICITY) 2.87 OM/7.6HM SOPN, Inject 0.75 mg into the skin once a week., Disp: 6 mL, Rfl: 0 ?  ezetimibe (ZETIA) 10 MG tablet, Take 1 tablet (10 mg total) by mouth daily., Disp: 90 tablet, Rfl: 1 ?  glucose blood (CONTOUR NEXT TEST) test strip, Use to check sugar  daily Dx E11.9, Disp: 100 strip, Rfl: 3 ?  lisinopril (ZESTRIL) 20 MG tablet, Take 1 tablet (20 mg total) by mouth daily., Disp: 90 tablet, Rfl: 1 ?  metFORMIN (GLUCOPHAGE) 1000 MG tablet, TAKE 1 TABLET (1,000 MG TOTAL) BY MOUTH 2 (TWO) TIMES DAILY WITH A MEAL., Disp: 180 tablet, Rfl: 1 ?  Microlet Lancets MISC, USE AS DIRECTED EVERY DAY, Disp: 100 each, Rfl: 11 ?  nebivolol (BYSTOLIC) 5 MG tablet, Take 1 tablet (5 mg total) by mouth daily., Disp: 90 tablet, Rfl: 1 ?  omeprazole (PRILOSEC) 40 MG capsule, TAKE 1 CAPSULE BY MOUTH EVERY DAY, Disp: 90 capsule, Rfl: 1 ?  scopolamine (TRANSDERM-SCOP) 1 MG/3DAYS, Place 1 patch (1.5 mg total) onto the skin every 3 (three) days., Disp: 4 patch, Rfl: 2 ?  simvastatin (ZOCOR) 40 MG  tablet, Take 1 tablet (40 mg total) by mouth at bedtime., Disp: 90 tablet, Rfl: 1 ?  timolol (BETIMOL) 0.25 % ophthalmic solution, 1-2 drops 2 (two) times daily., Disp: , Rfl:  ? ?Review of Systems:  ?Constitutional: Denies fever, chills, diaphoresis, appetite change and fatigue.  ?HEENT: Denies photophobia, eye pain, redness, hearing loss, ear pain, congestion, sore throat, rhinorrhea, sneezing, mouth sores, trouble swallowing, neck pain, neck stiffness and tinnitus.   ?Respiratory: Denies SOB, DOE, cough, chest tightness,  and wheezing.   ?Cardiovascular: Denies chest pain, palpitations and leg swelling.  ?Gastrointestinal: Denies nausea, vomiting, abdominal pain, diarrhea, constipation, blood in stool and abdominal distention.  ?Genitourinary: Denies dysuria, urgency, frequency, hematuria, flank pain and difficulty urinating.  ?Endocrine: Denies: hot or cold intolerance, sweats, changes in hair or nails, polyuria, polydipsia. ?Musculoskeletal: Denies myalgias, back pain, joint swelling, arthralgias and gait problem.  ?Skin: Denies pallor, rash and wound.  ?Neurological: Denies dizziness, seizures, syncope, weakness, light-headedness, numbness and headaches.  ?Hematological: Denies adenopathy. Easy bruising, personal or family bleeding history  ?Psychiatric/Behavioral: Denies suicidal ideation, mood changes, confusion, nervousness, sleep disturbance and agitation ? ? ? ?Physical Exam: ?Vitals:  ? 06/18/21 0755  ?BP: (!) 158/98  ?Pulse: 88  ?Temp: 98.4 ?F (36.9 ?C)  ?TempSrc: Oral  ?SpO2: 98%  ?Weight: 226 lb 11.2 oz (102.8 kg)  ? ? ?Body mass index is 36.59 kg/m?. ? ? ?Constitutional: NAD, calm, comfortable ?Eyes: PERRL, lids and conjunctivae normal ?ENMT: Mucous membranes are moist.  ?Respiratory: clear to auscultation bilaterally, no wheezing, no crackles. Normal respiratory effort. No accessory muscle use.  ?Cardiovascular: Regular rate and rhythm, no murmurs / rubs / gallops. No extremity edema.   ? ? ?Impression and Plan: ? ?Primary hypertension ?-As she is hesitant to further increase medications as she believes that at home blood pressure is within range, I will have her come back in 8 weeks with ambulatory measurements.  If most measurements above 130/80, I will go ahead and escalate her regimen. ?-In the meantime she will work on lifestyle changes including weight loss and decreased salt intake. ? ?Time spent: 23 minutes reviewing chart, interviewing and examining patient and formulating plan of care ? ? ? ? ?Lelon Frohlich, MD ?[facility] at [facility] ? ? ?

## 2021-06-26 ENCOUNTER — Encounter: Payer: Self-pay | Admitting: Internal Medicine

## 2021-07-05 ENCOUNTER — Other Ambulatory Visit: Payer: Self-pay | Admitting: Internal Medicine

## 2021-07-05 DIAGNOSIS — E1165 Type 2 diabetes mellitus with hyperglycemia: Secondary | ICD-10-CM

## 2021-07-09 ENCOUNTER — Other Ambulatory Visit: Payer: Self-pay | Admitting: Internal Medicine

## 2021-07-27 DIAGNOSIS — H5203 Hypermetropia, bilateral: Secondary | ICD-10-CM | POA: Diagnosis not present

## 2021-07-27 DIAGNOSIS — H40013 Open angle with borderline findings, low risk, bilateral: Secondary | ICD-10-CM | POA: Diagnosis not present

## 2021-07-27 LAB — HM DIABETES EYE EXAM

## 2021-07-30 ENCOUNTER — Ambulatory Visit: Payer: Medicare Other | Admitting: Internal Medicine

## 2021-07-30 ENCOUNTER — Encounter: Payer: Self-pay | Admitting: Internal Medicine

## 2021-08-13 ENCOUNTER — Ambulatory Visit (INDEPENDENT_AMBULATORY_CARE_PROVIDER_SITE_OTHER): Payer: Medicare Other | Admitting: Internal Medicine

## 2021-08-13 ENCOUNTER — Encounter: Payer: Self-pay | Admitting: Internal Medicine

## 2021-08-13 VITALS — BP 130/84 | HR 90 | Temp 98.3°F | Wt 232.0 lb

## 2021-08-13 DIAGNOSIS — I10 Essential (primary) hypertension: Secondary | ICD-10-CM | POA: Diagnosis not present

## 2021-08-13 DIAGNOSIS — E1169 Type 2 diabetes mellitus with other specified complication: Secondary | ICD-10-CM

## 2021-08-13 LAB — POCT GLYCOSYLATED HEMOGLOBIN (HGB A1C): Hemoglobin A1C: 6.1 % — AB (ref 4.0–5.6)

## 2021-08-13 MED ORDER — LISINOPRIL 40 MG PO TABS: 40.0000 mg | ORAL_TABLET | Freq: Every day | ORAL | 1 refills | Status: DC

## 2021-08-13 NOTE — Progress Notes (Signed)
? ? ? ?Established Patient Office Visit ? ? ? ? ?This visit occurred during the SARS-CoV-2 public health emergency.  Safety protocols were in place, including screening questions prior to the visit, additional usage of staff PPE, and extensive cleaning of exam room while observing appropriate contact time as indicated for disinfecting solutions.  ? ? ?CC/Reason for Visit: Follow-up blood pressure and other chronic conditions ? ?HPI: Brenda Obrien is a 56 y.o. female who is coming in today for the above mentioned reasons. Past Medical History is significant for: Hypertension, hyperlipidemia, type 2 diabetes, morbid obesity, GERD.  For management of her hypertension she is currently on lisinopril 20 mg and nebivolol 5 mg daily.  At last visit it was advised that we augment her antihypertensive regimen, however she preferred to do ambulatory monitoring and return today.  She brings in those measurements that I will insert below.  It would appear that most diastolics are above goal and about a third of her systolics are also above goal as well.  She feels well and has no acute concerns. ? ? ? ? ? ?Past Medical/Surgical History: ?Past Medical History:  ?Diagnosis Date  ? Asthma   ? Cataract   ? Depression   ? Diabetes mellitus, type 2 ([place]) 02/2018  ? Elevated cholesterol   ? Endometriosis   ? Generalized anxiety disorder 2017  ? GERD (gastroesophageal reflux disease)   ? Glaucoma   ? HSV-1 infection 06/1998  ? Hypertension   ? PTSD (post-traumatic stress disorder) 2017  ? ? ?Past Surgical History:  ?Procedure Laterality Date  ? ANTERIOR CRUCIATE LIGAMENT REPAIR  1987,2001,2003,2005  ? BACK SURGERY  01/2017  ? due herniated disc  ? CERVICAL FUSION  10/21/2018  ? EYE SURGERY Bilateral 2016  ? release pressure   ? LAPAROSCOPIC CHOLECYSTECTOMY  5/14  ? [place], [place]  ? LAPAROSCOPIC TOTAL HYSTERECTOMY  6/11  ? left knee surgery  09/2011  ? NASAL SINUS SURGERY  1992  ? REPLACEMENT TOTAL KNEE Left 9/14  ? Dr. Laurance Flatten  ?  SHOULDER SURGERY Right 09/08/2015  ? due to injury   ? SHOULDER SURGERY Right 03/20/2016  ? due to injury  ? SHOULDER SURGERY Left 09/10/2017  ? due rotator cuff  ? ? ?Social History: ? reports that she has been smoking cigarettes. She has been smoking an average of 1 pack per day. She has never used smokeless tobacco. She reports current alcohol use of about 14.0 - 21.0 standard drinks per week. She reports that she does not use drugs. ? ?Allergies: ?No Known Allergies ? ?Family History:  ?Family History  ?Problem Relation Age of Onset  ? Coronary artery disease Mother   ?     PAD  ? Heart attack Mother 99  ? Diabetes Mother   ? Lung cancer Father   ? Heart attack Father 36  ? Hypertension Brother   ? High Cholesterol Brother   ? ? ? ?Current Outpatient Medications:  ?  Blood Glucose Monitoring Suppl (CONTOUR MONITOR) w/Device KIT, 1 Device by Does not apply route daily. Patient is to test once daily. Dx E11.9, Disp: 1 kit, Rfl: 1 ?  diclofenac sodium (VOLTAREN) 1 % GEL, Apply 3 gm to 3 large joints up to 3 times a day.Dispense 3 tubes with 3 refills., Disp: 5 Tube, Rfl: 0 ?  ezetimibe (ZETIA) 10 MG tablet, Take 1 tablet (10 mg total) by mouth daily., Disp: 90 tablet, Rfl: 1 ?  glucose blood (CONTOUR NEXT TEST)  test strip, Use to check sugar daily Dx E11.9, Disp: 100 strip, Rfl: 3 ?  metFORMIN (GLUCOPHAGE) 1000 MG tablet, TAKE 1 TABLET (1,000 MG TOTAL) BY MOUTH 2 (TWO) TIMES DAILY WITH A MEAL., Disp: 180 tablet, Rfl: 1 ?  Microlet Lancets MISC, USE AS DIRECTED EVERY DAY, Disp: 100 each, Rfl: 11 ?  nebivolol (BYSTOLIC) 5 MG tablet, TAKE 1 TABLET (5 MG TOTAL) BY MOUTH DAILY., Disp: 90 tablet, Rfl: 1 ?  omeprazole (PRILOSEC) 40 MG capsule, TAKE 1 CAPSULE BY MOUTH EVERY DAY, Disp: 90 capsule, Rfl: 1 ?  scopolamine (TRANSDERM-SCOP) 1 MG/3DAYS, Place 1 patch (1.5 mg total) onto the skin every 3 (three) days., Disp: 4 patch, Rfl: 2 ?  simvastatin (ZOCOR) 40 MG tablet, TAKE 1 TABLET BY MOUTH EVERYDAY AT BEDTIME, Disp: 90  tablet, Rfl: 1 ?  timolol (BETIMOL) 0.25 % ophthalmic solution, 1-2 drops 2 (two) times daily., Disp: , Rfl:  ?  TRULICITY 6.75 QG/9.2EF SOPN, INJECT 0.75 MG INTO THE SKIN ONCE A WEEK., Disp: 2 mL, Rfl: 2 ?  lisinopril (ZESTRIL) 40 MG tablet, Take 1 tablet (40 mg total) by mouth daily., Disp: 90 tablet, Rfl: 1 ? ?Review of Systems:  ?Constitutional: Denies fever, chills, diaphoresis, appetite change and fatigue.  ?HEENT: Denies photophobia, eye pain, redness, hearing loss, ear pain, congestion, sore throat, rhinorrhea, sneezing, mouth sores, trouble swallowing, neck pain, neck stiffness and tinnitus.   ?Respiratory: Denies SOB, DOE, cough, chest tightness,  and wheezing.   ?Cardiovascular: Denies chest pain, palpitations and leg swelling.  ?Gastrointestinal: Denies nausea, vomiting, abdominal pain, diarrhea, constipation, blood in stool and abdominal distention.  ?Genitourinary: Denies dysuria, urgency, frequency, hematuria, flank pain and difficulty urinating.  ?Endocrine: Denies: hot or cold intolerance, sweats, changes in hair or nails, polyuria, polydipsia. ?Musculoskeletal: Denies myalgias, back pain, joint swelling, arthralgias and gait problem.  ?Skin: Denies pallor, rash and wound.  ?Neurological: Denies dizziness, seizures, syncope, weakness, light-headedness, numbness and headaches.  ?Hematological: Denies adenopathy. Easy bruising, personal or family bleeding history  ?Psychiatric/Behavioral: Denies suicidal ideation, mood changes, confusion, nervousness, sleep disturbance and agitation ? ? ? ?Physical Exam: ?Vitals:  ? 08/13/21 0710  ?BP: 130/84  ?Pulse: 90  ?Temp: 98.3 ?F (36.8 ?C)  ?TempSrc: Oral  ?SpO2: 100%  ?Weight: 232 lb (105.2 kg)  ? ? ?Body mass index is 37.45 kg/m?. ? ? ?Constitutional: NAD, calm, comfortable ?Eyes: PERRL, lids and conjunctivae normal, wears corrective lenses ?ENMT: Mucous membranes are moist.  ?Respiratory: clear to auscultation bilaterally, no wheezing, no crackles. Normal  respiratory effort. No accessory muscle use.  ?Cardiovascular: Regular rate and rhythm, no murmurs / rubs / gallops. No extremity edema.  ?Neurologic: Grossly intact and nonfocal ?Psychiatric: Normal judgment and insight. Alert and oriented x 3. Normal mood.  ? ? ?Impression and Plan: ? ?Type 2 diabetes mellitus with other specified complication, without long-term current use of insulin ([place]) ? - Plan: POCT glycosylated hemoglobin (Hb A1C) ?-A1c demonstrates good control at 6.1. ? ?Primary hypertension  ?- Plan: lisinopril (ZESTRIL) 40 MG tablet ?-Suboptimally controlled. ?-I would have preferred to add amlodipine, however she would like to decrease pill burden and attempt to increase lisinopril from 20 to 40 mg first. ? ? ? ?Time spent:31 minutes reviewing chart, interviewing and examining patient and formulating plan of care. ? ? ? ?Lelon Frohlich, MD ?[facility] at [facility] ? ? ?

## 2021-09-04 ENCOUNTER — Other Ambulatory Visit: Payer: Self-pay | Admitting: Internal Medicine

## 2021-09-04 DIAGNOSIS — E1165 Type 2 diabetes mellitus with hyperglycemia: Secondary | ICD-10-CM

## 2021-09-04 DIAGNOSIS — K219 Gastro-esophageal reflux disease without esophagitis: Secondary | ICD-10-CM

## 2021-10-05 ENCOUNTER — Other Ambulatory Visit: Payer: Self-pay | Admitting: Internal Medicine

## 2021-11-05 ENCOUNTER — Other Ambulatory Visit: Payer: Self-pay | Admitting: Internal Medicine

## 2021-11-05 DIAGNOSIS — E785 Hyperlipidemia, unspecified: Secondary | ICD-10-CM

## 2021-11-14 ENCOUNTER — Encounter: Payer: Self-pay | Admitting: Internal Medicine

## 2021-11-14 ENCOUNTER — Ambulatory Visit (INDEPENDENT_AMBULATORY_CARE_PROVIDER_SITE_OTHER): Payer: Medicare Other | Admitting: Internal Medicine

## 2021-11-14 VITALS — BP 160/90 | HR 84 | Temp 98.7°F | Ht 66.0 in | Wt 234.1 lb

## 2021-11-14 DIAGNOSIS — E1169 Type 2 diabetes mellitus with other specified complication: Secondary | ICD-10-CM

## 2021-11-14 DIAGNOSIS — E782 Mixed hyperlipidemia: Secondary | ICD-10-CM | POA: Diagnosis not present

## 2021-11-14 DIAGNOSIS — I10 Essential (primary) hypertension: Secondary | ICD-10-CM | POA: Diagnosis not present

## 2021-11-14 LAB — POCT GLYCOSYLATED HEMOGLOBIN (HGB A1C): Hemoglobin A1C: 6.5 % — AB (ref 4.0–5.6)

## 2021-11-14 MED ORDER — AMLODIPINE BESYLATE 5 MG PO TABS: 5.0000 mg | ORAL_TABLET | Freq: Every day | ORAL | 1 refills | Status: DC

## 2021-11-14 MED ORDER — TRULICITY 0.75 MG/0.5ML ~~LOC~~ SOAJ
SUBCUTANEOUS | 1 refills | Status: DC
Start: 2021-11-14 — End: 2022-01-28

## 2021-11-14 NOTE — Progress Notes (Signed)
Established Patient Office Visit     CC/Reason for Visit: 32-monthfollow-up chronic medical conditions  HPI: Brenda DADEis a 56y.o. female who is coming in today for the above mentioned reasons. Past Medical History is significant for: Hypertension, hyperlipidemia, type 2 diabetes, morbid obesity, GERD.  She has been feeling well.  She has been adherent to medical therapy.  Her blood pressure at home remains elevated.  She is currently on nebivolol 5 mg and lisinopril 40 mg which was increased from 20 mg at last visit.  She has no acute complaints.   Past Medical/Surgical History: Past Medical History:  Diagnosis Date   Asthma    Cataract    Depression    Diabetes mellitus, type 2 ([place] 02/2018   Elevated cholesterol    Endometriosis    Generalized anxiety disorder 2017   GERD (gastroesophageal reflux disease)    Glaucoma    HSV-1 infection 06/1998   Hypertension    PTSD (post-traumatic stress disorder) 2017    Past Surgical History:  Procedure Laterality Date   ANTERIOR CRUCIATE LIGAMENT REPAIR  1987,2001,2003,2005   BACK SURGERY  01/2017   due herniated disc   CERVICAL FUSION  10/21/2018   EYE SURGERY Bilateral 2016   release pressure    LAPAROSCOPIC [place]  5/14   [place]  LAPAROSCOPIC TOTAL HYSTERECTOMY  6/11   left knee surgery  09/2011   NASAL SINUS SURGERY  1992   REPLACEMENT TOTAL KNEE Left 9/14   Dr. DLaurance Flatten  SHOULDER SURGERY Right 09/08/2015   due to injury    SHOULDER SURGERY Right 03/20/2016   due to injury   SHOULDER SURGERY Left 09/10/2017   due rotator cuff    Social History:  reports that she has been smoking cigarettes. She has been smoking an average of 1 pack per day. She has never used smokeless tobacco. She reports current alcohol use of about 14.0 - 21.0 standard drinks of alcohol per week. She reports that she does not use drugs.  Allergies: No Known Allergies  Family History:  Family History  Problem  Relation Age of Onset   Coronary artery disease Mother        PAD   Heart attack Mother 54  Diabetes Mother    Lung cancer Father    Heart attack Father 353  Hypertension Brother    High Cholesterol Brother      Current Outpatient Medications:    amLODipine (NORVASC) 5 MG tablet, Take 1 tablet (5 mg total) by mouth daily., Disp: 90 tablet, Rfl: 1   Blood Glucose Monitoring Suppl (CONTOUR MONITOR) w/Device KIT, 1 Device by Does not apply route daily. Patient is to test once daily. Dx E11.9, Disp: 1 kit, Rfl: 1   diclofenac sodium (VOLTAREN) 1 % GEL, Apply 3 gm to 3 large joints up to 3 times a day.Dispense 3 tubes with 3 refills., Disp: 5 Tube, Rfl: 0   ezetimibe (ZETIA) 10 MG tablet, TAKE 1 TABLET BY MOUTH EVERY DAY, Disp: 90 tablet, Rfl: 1   glucose blood (CONTOUR NEXT TEST) test strip, Use to check sugar daily Dx E11.9, Disp: 100 strip, Rfl: 3   lisinopril (ZESTRIL) 40 MG tablet, Take 1 tablet (40 mg total) by mouth daily., Disp: 90 tablet, Rfl: 1   metFORMIN (GLUCOPHAGE) 1000 MG tablet, TAKE 1 TABLET (1,000 MG TOTAL) BY MOUTH 2 (TWO) TIMES DAILY WITH A MEAL., Disp: 60 tablet, Rfl: 2   Microlet  Lancets MISC, USE AS DIRECTED EVERY DAY, Disp: 100 each, Rfl: 11   nebivolol (BYSTOLIC) 5 MG tablet, TAKE 1 TABLET (5 MG TOTAL) BY MOUTH DAILY., Disp: 90 tablet, Rfl: 1   omeprazole (PRILOSEC) 40 MG capsule, TAKE 1 CAPSULE BY MOUTH EVERY DAY, Disp: 30 capsule, Rfl: 2   scopolamine (TRANSDERM-SCOP) 1 MG/3DAYS, Place 1 patch (1.5 mg total) onto the skin every 3 (three) days., Disp: 4 patch, Rfl: 2   simvastatin (ZOCOR) 40 MG tablet, TAKE 1 TABLET BY MOUTH EVERYDAY AT BEDTIME, Disp: 90 tablet, Rfl: 1   timolol (BETIMOL) 0.25 % ophthalmic solution, 1-2 drops 2 (two) times daily., Disp: , Rfl:    Dulaglutide (TRULICITY) 6.64 QI/3.4VQ SOPN, INJECT 0.75 MG SUBCUTANEOUSLY ONE TIME PER WEEK, Disp: 1 mL, Rfl: 1  Review of Systems:  Constitutional: Denies fever, chills, diaphoresis, appetite change and  fatigue.  HEENT: Denies photophobia, eye pain, redness, hearing loss, ear pain, congestion, sore throat, rhinorrhea, sneezing, mouth sores, trouble swallowing, neck pain, neck stiffness and tinnitus.   Respiratory: Denies SOB, DOE, cough, chest tightness,  and wheezing.   Cardiovascular: Denies chest pain, palpitations and leg swelling.  Gastrointestinal: Denies nausea, vomiting, abdominal pain, diarrhea, constipation, blood in stool and abdominal distention.  Genitourinary: Denies dysuria, urgency, frequency, hematuria, flank pain and difficulty urinating.  Endocrine: Denies: hot or cold intolerance, sweats, changes in hair or nails, polyuria, polydipsia. Musculoskeletal: Denies myalgias, back pain, joint swelling, arthralgias and gait problem.  Skin: Denies pallor, rash and wound.  Neurological: Denies dizziness, seizures, syncope, weakness, light-headedness, numbness and headaches.  Hematological: Denies adenopathy. Easy bruising, personal or family bleeding history  Psychiatric/Behavioral: Denies suicidal ideation, mood changes, confusion, nervousness, sleep disturbance and agitation    Physical Exam: Vitals:   11/14/21 0710 11/14/21 0723  BP: (!) 166/96 (!) 160/90  Pulse: 84   Temp: 98.7 F (37.1 C)   TempSrc: Oral   SpO2: 99%   Weight: 234 lb 1.6 oz (106.2 kg)   Height: 5' 6"  (1.676 m)     Body mass index is 37.78 kg/m.   Constitutional: NAD, calm, comfortable Eyes: PERRL, lids and conjunctivae normal, wears corrective lenses ENMT: Mucous membranes are moist. Posterior pharynx clear of any exudate or lesions. Normal dentition. Tympanic membrane is pearly white, no erythema or bulging. Neck: normal, supple, no masses, no thyromegaly Respiratory: clear to auscultation bilaterally, no wheezing, no crackles. Normal respiratory effort. No accessory muscle use.  Cardiovascular: Regular rate and rhythm, no murmurs / rubs / gallops. No extremity edema.  Psychiatric: Normal  judgment and insight. Alert and oriented x 3. Normal mood.    Impression and Plan:  Type 2 diabetes mellitus with other specified complication, without long-term current use of insulin ([place])  - Plan: POCT glycosylated hemoglobin (Hb A1C), Dulaglutide (TRULICITY) 2.59 DG/3.8VF SOPN, Microalbumin / creatinine urine ratio -A1c demonstrates good control at 6.5, check microalbumin, requesting Trulicity refills.  Primary hypertension  - Plan: amLODipine (NORVASC) 5 MG tablet -Blood pressure is not well controlled on lisinopril 40 mg and nebivolol 5 mg.  Add amlodipine 5 mg and recheck in 3 months.  Mixed hyperlipidemia  - Plan: CBC with Differential/Platelet, Comprehensive metabolic panel, Lipid panel -At last visit we added ezetimibe to simvastatin, check lipids today, goal LDL is less than 70.  Morbid obesity ([place])  - Plan: Vitamin B12 -Discussed healthy lifestyle, including increased physical activity and better food choices to promote weight loss.    Time spent:31 minutes reviewing chart, interviewing and examining patient and formulating  plan of care.     Lelon Frohlich, MD [facility] at [facility]

## 2021-12-03 ENCOUNTER — Other Ambulatory Visit: Payer: Self-pay | Admitting: Internal Medicine

## 2021-12-03 DIAGNOSIS — K219 Gastro-esophageal reflux disease without esophagitis: Secondary | ICD-10-CM

## 2021-12-03 DIAGNOSIS — E1165 Type 2 diabetes mellitus with hyperglycemia: Secondary | ICD-10-CM

## 2021-12-06 ENCOUNTER — Telehealth: Payer: Self-pay | Admitting: Internal Medicine

## 2021-12-06 NOTE — Telephone Encounter (Signed)
Office visit scheduled.

## 2021-12-06 NOTE — Telephone Encounter (Signed)
Hi Dr. Ardyth Harps, Pt is stating the amlodipine is making her feel sick, and she started to feel better once she stopped taking it.

## 2021-12-06 NOTE — Telephone Encounter (Signed)
Pt called to say Rx is making her feel awful. States she is very tired, no energy, and pain in her arms and legs.  Pt started taking Rx on 8/2 and stopped on 8/22.  Pt states she started to feel much better once she stopped taking this medication.  amLODipine (NORVASC) 5 MG tablet  Pt thinks that taking the simvastatin at the same time as the amLODipine (NORVASC) 5 MG tablet may be the reason.  Pt is requesting a call back to discuss. [phone]

## 2021-12-12 ENCOUNTER — Encounter: Payer: Self-pay | Admitting: Internal Medicine

## 2021-12-12 ENCOUNTER — Ambulatory Visit (INDEPENDENT_AMBULATORY_CARE_PROVIDER_SITE_OTHER): Payer: Medicare Other | Admitting: Internal Medicine

## 2021-12-12 VITALS — BP 157/86 | HR 65 | Temp 98.4°F | Wt 233.9 lb

## 2021-12-12 DIAGNOSIS — Z23 Encounter for immunization: Secondary | ICD-10-CM

## 2021-12-12 DIAGNOSIS — I10 Essential (primary) hypertension: Secondary | ICD-10-CM

## 2021-12-12 NOTE — Progress Notes (Signed)
Established Patient Office Visit     CC/Reason for Visit: Follow-up blood pressure, possible medication side effect  HPI: Brenda Obrien is a 56 y.o. female who is coming in today for the above mentioned reasons.  At last visit she was noted to have elevated blood pressure.  She is on lisinopril 40 and nebivolol 5 mg daily.  It was decided to add amlodipine 5 mg.  About 3 days after starting it she describes runny nose, sore throat and headache.  This quickly progressed to body aches.  She thought this was related to the amlodipine and decided to discontinue it.  She states she took a home COVID test that was negative.  She took the amlodipine for about 2 weeks and notes that her blood pressure had dropped to around 130/70.  She is now feeling back to baseline.  Past Medical/Surgical History: Past Medical History:  Diagnosis Date   Asthma    Cataract    Depression    Diabetes mellitus, type 2 ([place]) 02/2018   Elevated cholesterol    Endometriosis    Generalized anxiety disorder 2017   GERD (gastroesophageal reflux disease)    Glaucoma    HSV-1 infection 06/1998   Hypertension    PTSD (post-traumatic stress disorder) 2017    Past Surgical History:  Procedure Laterality Date   ANTERIOR CRUCIATE LIGAMENT REPAIR  1987,2001,2003,2005   BACK SURGERY  01/2017   due herniated disc   CERVICAL FUSION  10/21/2018   EYE SURGERY Bilateral 2016   release pressure    LAPAROSCOPIC CHOLECYSTECTOMY  5/14   [place], [place]   LAPAROSCOPIC TOTAL HYSTERECTOMY  6/11   left knee surgery  09/2011   NASAL SINUS SURGERY  1992   REPLACEMENT TOTAL KNEE Left 9/14   Dr. Laurance Flatten   SHOULDER SURGERY Right 09/08/2015   due to injury    SHOULDER SURGERY Right 03/20/2016   due to injury   SHOULDER SURGERY Left 09/10/2017   due rotator cuff    Social History:  reports that she has been smoking cigarettes. She has been smoking an average of 1 pack per day. She has never used smokeless tobacco. She  reports current alcohol use of about 14.0 - 21.0 standard drinks of alcohol per week. She reports that she does not use drugs.  Allergies: No Known Allergies  Family History:  Family History  Problem Relation Age of Onset   Coronary artery disease Mother        PAD   Heart attack Mother 47   Diabetes Mother    Lung cancer Father    Heart attack Father 78   Hypertension Brother    High Cholesterol Brother      Current Outpatient Medications:    amLODipine (NORVASC) 5 MG tablet, Take 1 tablet (5 mg total) by mouth daily., Disp: 90 tablet, Rfl: 1   Blood Glucose Monitoring Suppl (CONTOUR MONITOR) w/Device KIT, 1 Device by Does not apply route daily. Patient is to test once daily. Dx E11.9, Disp: 1 kit, Rfl: 1   diclofenac sodium (VOLTAREN) 1 % GEL, Apply 3 gm to 3 large joints up to 3 times a day.Dispense 3 tubes with 3 refills., Disp: 5 Tube, Rfl: 0   Dulaglutide (TRULICITY) 4.00 QQ/7.6PP SOPN, INJECT 0.75 MG SUBCUTANEOUSLY ONE TIME PER WEEK, Disp: 1 mL, Rfl: 1   ezetimibe (ZETIA) 10 MG tablet, TAKE 1 TABLET BY MOUTH EVERY DAY, Disp: 90 tablet, Rfl: 1   glucose blood (CONTOUR NEXT  TEST) test strip, Use to check sugar daily Dx E11.9, Disp: 100 strip, Rfl: 3   lisinopril (ZESTRIL) 40 MG tablet, Take 1 tablet (40 mg total) by mouth daily., Disp: 90 tablet, Rfl: 1   metFORMIN (GLUCOPHAGE) 1000 MG tablet, TAKE 1 TABLET (1,000 MG TOTAL) BY MOUTH TWICE A DAY WITH FOOD, Disp: 180 tablet, Rfl: 0   Microlet Lancets MISC, USE AS DIRECTED EVERY DAY, Disp: 100 each, Rfl: 11   nebivolol (BYSTOLIC) 5 MG tablet, TAKE 1 TABLET (5 MG TOTAL) BY MOUTH DAILY., Disp: 90 tablet, Rfl: 1   omeprazole (PRILOSEC) 40 MG capsule, TAKE 1 CAPSULE BY MOUTH EVERY DAY, Disp: 90 capsule, Rfl: 0   scopolamine (TRANSDERM-SCOP) 1 MG/3DAYS, Place 1 patch (1.5 mg total) onto the skin every 3 (three) days., Disp: 4 patch, Rfl: 2   simvastatin (ZOCOR) 40 MG tablet, TAKE 1 TABLET BY MOUTH EVERYDAY AT BEDTIME, Disp: 90 tablet,  Rfl: 1   timolol (BETIMOL) 0.25 % ophthalmic solution, 1-2 drops 2 (two) times daily., Disp: , Rfl:   Review of Systems:  Constitutional: Denies fever, chills, diaphoresis, appetite change and fatigue.  HEENT: Denies photophobia, eye pain, redness, hearing loss, ear pain, congestion, sore throat, rhinorrhea, sneezing, mouth sores, trouble swallowing, neck pain, neck stiffness and tinnitus.   Respiratory: Denies SOB, DOE, cough, chest tightness,  and wheezing.   Cardiovascular: Denies chest pain, palpitations and leg swelling.  Gastrointestinal: Denies nausea, vomiting, abdominal pain, diarrhea, constipation, blood in stool and abdominal distention.  Genitourinary: Denies dysuria, urgency, frequency, hematuria, flank pain and difficulty urinating.  Endocrine: Denies: hot or cold intolerance, sweats, changes in hair or nails, polyuria, polydipsia. Musculoskeletal: Denies myalgias, back pain, joint swelling, arthralgias and gait problem.  Skin: Denies pallor, rash and wound.  Neurological: Denies dizziness, seizures, syncope, weakness, light-headedness, numbness and headaches.  Hematological: Denies adenopathy. Easy bruising, personal or family bleeding history  Psychiatric/Behavioral: Denies suicidal ideation, mood changes, confusion, nervousness, sleep disturbance and agitation    Physical Exam: Vitals:   12/12/21 0702 12/12/21 0705  BP: (!) 170/100 (!) 157/86  Pulse: 65   Temp: 98.4 F (36.9 C)   TempSrc: Oral   SpO2: 98%   Weight: 233 lb 14.4 oz (106.1 kg)     Body mass index is 37.75 kg/m.   Constitutional: NAD, calm, comfortable Eyes: PERRL, lids and conjunctivae normal ENMT: Mucous membranes are moist.  Respiratory: clear to auscultation bilaterally, no wheezing, no crackles. Normal respiratory effort. No accessory muscle use.  Cardiovascular: Regular rate and rhythm, no murmurs / rubs / gallops. No extremity edema.   Psychiatric: Normal judgment and insight. Alert and  oriented x 3. Normal mood.    Impression and Plan:  Primary hypertension -I think the timing of starting amlodipine just coincided with likely a respiratory viral infection.  We have agreed to restart amlodipine and she will contact me if she has any issues.  She is to continue lisinopril and nebivolol as previously prescribed doses.  Needs flu shot  - Plan: Flu Vaccine QUAD 6+ mos PF IM (Fluarix Quad PF)    Time spent:23 minutes reviewing chart, interviewing and examining patient and formulating plan of care.    Lelon Frohlich, MD [facility] at [facility]

## 2021-12-19 ENCOUNTER — Other Ambulatory Visit: Payer: Self-pay | Admitting: Internal Medicine

## 2021-12-19 ENCOUNTER — Telehealth: Payer: Self-pay | Admitting: Internal Medicine

## 2021-12-19 DIAGNOSIS — I10 Essential (primary) hypertension: Secondary | ICD-10-CM

## 2021-12-19 NOTE — Telephone Encounter (Signed)
Refill sent.

## 2021-12-19 NOTE — Telephone Encounter (Signed)
Pt states the pharmacy told her she would need MD to send a new prescription for the following:  lisinopril (ZESTRIL) 40 MG tablet  Pt is completely out of this medication.  LOV:  12/12/21  [id number] - [place], [place] - [address] Phone:  [phone]  Fax:  [phone]     Please advise.

## 2022-01-06 ENCOUNTER — Other Ambulatory Visit: Payer: Self-pay | Admitting: Internal Medicine

## 2022-01-25 ENCOUNTER — Other Ambulatory Visit: Payer: Self-pay | Admitting: Internal Medicine

## 2022-01-25 DIAGNOSIS — E1169 Type 2 diabetes mellitus with other specified complication: Secondary | ICD-10-CM

## 2022-02-03 ENCOUNTER — Other Ambulatory Visit: Payer: Self-pay | Admitting: Internal Medicine

## 2022-02-03 DIAGNOSIS — K219 Gastro-esophageal reflux disease without esophagitis: Secondary | ICD-10-CM

## 2022-02-14 ENCOUNTER — Ambulatory Visit (INDEPENDENT_AMBULATORY_CARE_PROVIDER_SITE_OTHER): Payer: Medicare Other | Admitting: Internal Medicine

## 2022-02-14 ENCOUNTER — Other Ambulatory Visit: Payer: Self-pay | Admitting: Internal Medicine

## 2022-02-14 ENCOUNTER — Encounter: Payer: Self-pay | Admitting: Internal Medicine

## 2022-02-14 VITALS — BP 129/79 | HR 100 | Temp 98.6°F | Wt 233.3 lb

## 2022-02-14 DIAGNOSIS — M7711 Lateral epicondylitis, right elbow: Secondary | ICD-10-CM

## 2022-02-14 DIAGNOSIS — E1169 Type 2 diabetes mellitus with other specified complication: Secondary | ICD-10-CM | POA: Diagnosis not present

## 2022-02-14 DIAGNOSIS — I10 Essential (primary) hypertension: Secondary | ICD-10-CM

## 2022-02-14 DIAGNOSIS — E538 Deficiency of other specified B group vitamins: Secondary | ICD-10-CM | POA: Insufficient documentation

## 2022-02-14 DIAGNOSIS — E782 Mixed hyperlipidemia: Secondary | ICD-10-CM | POA: Diagnosis not present

## 2022-02-14 DIAGNOSIS — R7401 Elevation of levels of liver transaminase levels: Secondary | ICD-10-CM

## 2022-02-14 DIAGNOSIS — R7989 Other specified abnormal findings of blood chemistry: Secondary | ICD-10-CM

## 2022-02-14 LAB — COMPREHENSIVE METABOLIC PANEL
ALT: 67 U/L — ABNORMAL HIGH (ref 0–35)
AST: 44 U/L — ABNORMAL HIGH (ref 0–37)
Albumin: 4.6 g/dL (ref 3.5–5.2)
Alkaline Phosphatase: 88 U/L (ref 39–117)
BUN: 7 mg/dL (ref 6–23)
CO2: 29 mEq/L (ref 19–32)
Calcium: 9.9 mg/dL (ref 8.4–10.5)
Chloride: 99 mEq/L (ref 96–112)
Creatinine, Ser: 0.67 mg/dL (ref 0.40–1.20)
GFR: 98.75 mL/min (ref >60.00)
Glucose, Bld: 123 mg/dL — ABNORMAL HIGH (ref 70–99)
Potassium: 4.9 mEq/L (ref 3.5–5.1)
Sodium: 137 mEq/L (ref 135–145)
Total Bilirubin: 0.6 mg/dL (ref 0.2–1.2)
Total Protein: 7.4 g/dL (ref 6.0–8.3)

## 2022-02-14 LAB — CBC WITH DIFFERENTIAL/PLATELET
Basophils Absolute: 0.1 10*3/uL (ref 0.0–0.1)
Basophils Relative: 0.8 % (ref 0.0–3.0)
Eosinophils Absolute: 0.2 10*3/uL (ref 0.0–0.7)
Eosinophils Relative: 2.4 % (ref 0.0–5.0)
HCT: 43.2 % (ref 36.0–46.0)
Hemoglobin: 14.6 g/dL (ref 12.0–15.0)
Lymphocytes Relative: 23.1 % (ref 12.0–46.0)
Lymphs Abs: 2.2 10*3/uL (ref 0.7–4.0)
MCHC: 33.7 g/dL (ref 30.0–36.0)
MCV: 95 fl (ref 78.0–100.0)
Monocytes Absolute: 0.7 10*3/uL (ref 0.1–1.0)
Monocytes Relative: 7.5 % (ref 3.0–12.0)
Neutro Abs: 6.4 10*3/uL (ref 1.4–7.7)
Neutrophils Relative %: 66.2 % (ref 43.0–77.0)
Platelets: 229 10*3/uL (ref 150.0–400.0)
RBC: 4.55 Mil/uL (ref 3.87–5.11)
RDW: 13 % (ref 11.5–15.5)
WBC: 9.7 10*3/uL (ref 4.0–10.5)

## 2022-02-14 LAB — MICROALBUMIN / CREATININE URINE RATIO
Creatinine,U: 81.8 mg/dL
Microalb Creat Ratio: 1.3 mg/g (ref 0.0–30.0)
Microalb, Ur: 1 mg/dL (ref 0.0–1.9)

## 2022-02-14 LAB — LIPID PANEL
Cholesterol: 140 mg/dL (ref 0–200)
HDL: 52.5 mg/dL (ref >39.00)
LDL Cholesterol: 64 mg/dL (ref 0–99)
NonHDL: 87.08
Total CHOL/HDL Ratio: 3
Triglycerides: 114 mg/dL (ref 0.0–149.0)
VLDL: 22.8 mg/dL (ref 0.0–40.0)

## 2022-02-14 LAB — VITAMIN B12: Vitamin B-12: 75 pg/mL — ABNORMAL LOW (ref 211–911)

## 2022-02-14 LAB — POCT GLYCOSYLATED HEMOGLOBIN (HGB A1C): Hemoglobin A1C: 6.5 % — AB (ref 4.0–5.6)

## 2022-02-14 NOTE — Progress Notes (Signed)
Established Patient Office Visit     CC/Reason for Visit: 17-monthfollow-up chronic medical conditions  HPI: Brenda MARINAis a 56y.o. female who is coming in today for the above mentioned reasons. Past Medical History is significant for: Hypertension, hyperlipidemia, type 2 diabetes, morbid obesity, GERD.  She has been doing well.  She is complaining of right lateral elbow pain.  She has been compliant with newly started amlodipine and is doing well with that.  She will sometimes have some lower extremity tightness but no edema.   Past Medical/Surgical History: Past Medical History:  Diagnosis Date   Asthma    Cataract    Depression    Diabetes mellitus, type 2 ([place] 02/2018   Elevated cholesterol    Endometriosis    Generalized anxiety disorder 2017   GERD (gastroesophageal reflux disease)    Glaucoma    HSV-1 infection 06/1998   Hypertension    PTSD (post-traumatic stress disorder) 2017    Past Surgical History:  Procedure Laterality Date   ANTERIOR CRUCIATE LIGAMENT REPAIR  1987,2001,2003,2005   BACK SURGERY  01/2017   due herniated disc   CERVICAL FUSION  10/21/2018   EYE SURGERY Bilateral 2016   release pressure    [place]OS[place]IC [place]  5[place]  LAPAROSCOPIC TOTAL HYSTERECTOMY  6/11   left knee surgery  09/2011   NASAL SINUS SURGERY  1992   REPLACEMENT TOTAL KNEE Left 9/14   Dr. DLaurance Flatten  SHOULDER SURGERY Right 09/08/2015   due to injury    SHOULDER SURGERY Right 03/20/2016   due to injury   SHOULDER SURGERY Left 09/10/2017   due rotator cuff    Social History:  reports that she has been smoking cigarettes. She has been smoking an average of 1 pack per day. She has never used smokeless tobacco. She reports current alcohol use of about 14.0 - 21.0 standard drinks of alcohol per week. She reports that she does not use drugs.  Allergies: No Known Allergies  Family History:  Family History  Problem Relation Age of Onset    Coronary artery disease Mother        PAD   Heart attack Mother 555  Diabetes Mother    Lung cancer Father    Heart attack Father 343  Hypertension Brother    High Cholesterol Brother      Current Outpatient Medications:    amLODipine (NORVASC) 5 MG tablet, Take 1 tablet (5 mg total) by mouth daily., Disp: 90 tablet, Rfl: 1   Blood Glucose Monitoring Suppl (CONTOUR MONITOR) w/Device KIT, 1 Device by Does not apply route daily. Patient is to test once daily. Dx E11.9, Disp: 1 kit, Rfl: 1   diclofenac sodium (VOLTAREN) 1 % GEL, Apply 3 gm to 3 large joints up to 3 times a day.Dispense 3 tubes with 3 refills., Disp: 5 Tube, Rfl: 0   ezetimibe (ZETIA) 10 MG tablet, TAKE 1 TABLET BY MOUTH EVERY DAY, Disp: 90 tablet, Rfl: 1   glucose blood (CONTOUR NEXT TEST) test strip, Use to check sugar daily Dx E11.9, Disp: 100 strip, Rfl: 3   lisinopril (ZESTRIL) 40 MG tablet, TAKE 1 TABLET BY MOUTH EVERY DAY, Disp: 30 tablet, Rfl: 5   metFORMIN (GLUCOPHAGE) 1000 MG tablet, TAKE 1 TABLET (1,000 MG TOTAL) BY MOUTH TWICE A DAY WITH FOOD, Disp: 180 tablet, Rfl: 0   Microlet Lancets MISC, USE AS DIRECTED EVERY DAY, Disp: 100 each, Rfl:  11   nebivolol (BYSTOLIC) 5 MG tablet, TAKE 1 TABLET (5 MG TOTAL) BY MOUTH DAILY., Disp: 90 tablet, Rfl: 0   omeprazole (PRILOSEC) 40 MG capsule, TAKE 1 CAPSULE BY MOUTH EVERY DAY, Disp: 90 capsule, Rfl: 1   scopolamine (TRANSDERM-SCOP) 1 MG/3DAYS, Place 1 patch (1.5 mg total) onto the skin every 3 (three) days., Disp: 4 patch, Rfl: 2   simvastatin (ZOCOR) 40 MG tablet, TAKE 1 TABLET BY MOUTH EVERYDAY AT BEDTIME, Disp: 90 tablet, Rfl: 0   timolol (BETIMOL) 0.25 % ophthalmic solution, 1-2 drops 2 (two) times daily., Disp: , Rfl:    TRULICITY 1.59 YV/8.5FY SOPN, INJECT 0.75 MG SUBCUTANEOUSLY ONE TIME PER WEEK, Disp: 2 mL, Rfl: 2  Review of Systems:  Constitutional: Denies fever, chills, diaphoresis, appetite change and fatigue.  HEENT: Denies photophobia, eye pain, redness,  hearing loss, ear pain, congestion, sore throat, rhinorrhea, sneezing, mouth sores, trouble swallowing, neck pain, neck stiffness and tinnitus.   Respiratory: Denies SOB, DOE, cough, chest tightness,  and wheezing.   Cardiovascular: Denies chest pain, palpitations and leg swelling.  Gastrointestinal: Denies nausea, vomiting, abdominal pain, diarrhea, constipation, blood in stool and abdominal distention.  Genitourinary: Denies dysuria, urgency, frequency, hematuria, flank pain and difficulty urinating.  Endocrine: Denies: hot or cold intolerance, sweats, changes in hair or nails, polyuria, polydipsia. Skin: Denies pallor, rash and wound.  Neurological: Denies dizziness, seizures, syncope, weakness, light-headedness, numbness and headaches.  Hematological: Denies adenopathy. Easy bruising, personal or family bleeding history  Psychiatric/Behavioral: Denies suicidal ideation, mood changes, confusion, nervousness, sleep disturbance and agitation    Physical Exam: Vitals:   02/14/22 0707 02/14/22 0745  BP: 130/80 129/79  Pulse: 100   Temp: 98.6 F (37 C)   TempSrc: Oral   SpO2: 98%   Weight: 233 lb 4.8 oz (105.8 kg)     Body mass index is 37.66 kg/m.   Constitutional: NAD, calm, comfortable Eyes: PERRL, lids and conjunctivae normal, wears corrective lenses ENMT: Mucous membranes are moist.  Respiratory: clear to auscultation bilaterally, no wheezing, no crackles. Normal respiratory effort. No accessory muscle use.  Cardiovascular: Regular rate and rhythm, no murmurs / rubs / gallops. No extremity edema.   Psychiatric: Normal judgment and insight. Alert and oriented x 3. Normal mood.    Impression and Plan:  Type 2 diabetes mellitus with other specified complication, without long-term current use of insulin ([place]) - Plan: POCT glycosylated hemoglobin (Hb A1C), POCT Urine microalbumin-creatinine with uACR, Microalbumin/Creatinine Ratio, Urine  Morbid obesity ([place]) - Plan: Vitamin  B12  Mixed hyperlipidemia - Plan: Lipid panel, Comprehensive metabolic panel, CBC with Differential/Platelet  Primary hypertension  Right tennis elbow  -A1c of 6.5 demonstrates excellent diabetic control. -Blood pressure is fairly well controlled with addition of amlodipine to nebivolol and lisinopril. -For her right tennis elbow we discussed icing, topical NSAIDs, bracing and a 5-day course of scheduled ibuprofen.  If no improvement can consider referral to sports medicine/orthopedics. -As of 2022 her lipids were not at goal, recheck lipids today, she is currently on simvastatin 40 mg and ezetimibe 10 mg.  Time spent:31 minutes reviewing chart, interviewing and examining patient and formulating plan of care.      Lelon Frohlich, MD [facility] at [facility]

## 2022-02-15 ENCOUNTER — Ambulatory Visit (INDEPENDENT_AMBULATORY_CARE_PROVIDER_SITE_OTHER): Payer: Medicare Other

## 2022-02-15 ENCOUNTER — Telehealth: Payer: Self-pay

## 2022-02-15 DIAGNOSIS — E538 Deficiency of other specified B group vitamins: Secondary | ICD-10-CM | POA: Diagnosis not present

## 2022-02-15 MED ORDER — CYANOCOBALAMIN 1000 MCG/ML IJ SOLN
1000.0000 ug | INTRAMUSCULAR | Status: AC
  Administered 2022-02-15 – 2022-02-22 (×2): 1000 ug via INTRAMUSCULAR

## 2022-02-15 NOTE — Progress Notes (Signed)
Pt here for weekly B12 injection #1 of 4 per Dr Jerilee Hoh.  B12 1096mcg given IM, right deltoid and pt tolerated injection well.  Next B12 injection scheduled for 02/22/22.  Dr Legrand Como please cosign since PCP is out of office.

## 2022-02-15 NOTE — Telephone Encounter (Signed)
Received call from lab that unable to add on Hepatic Function lab.   LVM for pt to call back to schedule lab appt. Lab order is in.

## 2022-02-22 ENCOUNTER — Other Ambulatory Visit: Payer: Medicare Other

## 2022-02-22 ENCOUNTER — Ambulatory Visit (INDEPENDENT_AMBULATORY_CARE_PROVIDER_SITE_OTHER): Payer: Medicare Other

## 2022-02-22 DIAGNOSIS — E538 Deficiency of other specified B group vitamins: Secondary | ICD-10-CM | POA: Diagnosis not present

## 2022-02-22 DIAGNOSIS — R7989 Other specified abnormal findings of blood chemistry: Secondary | ICD-10-CM

## 2022-02-22 MED ORDER — CYANOCOBALAMIN 1000 MCG/ML IJ SOLN: 1000.0000 ug | Freq: Once | INTRAMUSCULAR | Status: DC

## 2022-02-22 NOTE — Addendum Note (Signed)
Addended by: Marian Sorrow D on: 02/22/2022 11:54 AM   Modules accepted: Orders

## 2022-02-22 NOTE — Progress Notes (Signed)
Per orders of Dr. Caryl Never , injection of Cyanocobalamin Inj. 1000 mcg given by Vickii Chafe on Left Deltoid.  Patient tolerated injection well.

## 2022-02-23 LAB — HEPATITIS PANEL, ACUTE
Hep A IgM: NONREACTIVE
Hep B C IgM: NONREACTIVE
Hepatitis B Surface Ag: NONREACTIVE
Hepatitis C Ab: NONREACTIVE

## 2022-02-24 ENCOUNTER — Other Ambulatory Visit: Payer: Self-pay | Admitting: Internal Medicine

## 2022-02-24 DIAGNOSIS — E1165 Type 2 diabetes mellitus with hyperglycemia: Secondary | ICD-10-CM

## 2022-02-28 ENCOUNTER — Ambulatory Visit (INDEPENDENT_AMBULATORY_CARE_PROVIDER_SITE_OTHER): Payer: Medicare Other

## 2022-02-28 DIAGNOSIS — E538 Deficiency of other specified B group vitamins: Secondary | ICD-10-CM | POA: Diagnosis not present

## 2022-02-28 MED ORDER — CYANOCOBALAMIN 1000 MCG/ML IJ SOLN
1000.0000 ug | Freq: Once | INTRAMUSCULAR | Status: AC
  Administered 2022-02-28: 1000 ug via INTRAMUSCULAR

## 2022-02-28 NOTE — Progress Notes (Signed)
Per orders of Dr. Ardyth Harps, injection of Cyanocobalamin inj 1000 mcg  given by Vickii Chafe on Right Deltoid.   Patient tolerated injection well.

## 2022-03-05 ENCOUNTER — Ambulatory Visit (INDEPENDENT_AMBULATORY_CARE_PROVIDER_SITE_OTHER): Payer: Medicare Other

## 2022-03-05 DIAGNOSIS — E538 Deficiency of other specified B group vitamins: Secondary | ICD-10-CM | POA: Diagnosis not present

## 2022-03-05 MED ORDER — CYANOCOBALAMIN 1000 MCG/ML IJ SOLN
1000.0000 ug | Freq: Once | INTRAMUSCULAR | Status: AC
  Administered 2022-03-05: 1000 ug via INTRAMUSCULAR

## 2022-03-05 NOTE — Progress Notes (Signed)
Pt here for monthly B12 injection per Dr Hernandez  B12 1000mcg given IM, and pt tolerated injection well.  Next B12 injection scheduled for next month.  

## 2022-04-04 ENCOUNTER — Other Ambulatory Visit: Payer: Self-pay | Admitting: Internal Medicine

## 2022-04-04 ENCOUNTER — Ambulatory Visit (INDEPENDENT_AMBULATORY_CARE_PROVIDER_SITE_OTHER): Payer: Medicare Other | Admitting: *Deleted

## 2022-04-04 DIAGNOSIS — E538 Deficiency of other specified B group vitamins: Secondary | ICD-10-CM | POA: Diagnosis not present

## 2022-04-04 MED ORDER — CYANOCOBALAMIN 1000 MCG/ML IJ SOLN
1000.0000 ug | Freq: Once | INTRAMUSCULAR | Status: AC
  Administered 2022-04-04: 1000 ug via INTRAMUSCULAR

## 2022-04-04 NOTE — Progress Notes (Signed)
Per orders of Dr. Hernandez, injection of B12 given by Janica Eldred. Patient tolerated injection well.  

## 2022-04-20 ENCOUNTER — Other Ambulatory Visit: Payer: Self-pay | Admitting: Internal Medicine

## 2022-04-20 DIAGNOSIS — E1169 Type 2 diabetes mellitus with other specified complication: Secondary | ICD-10-CM

## 2022-05-05 ENCOUNTER — Other Ambulatory Visit: Payer: Self-pay | Admitting: Internal Medicine

## 2022-05-05 DIAGNOSIS — E785 Hyperlipidemia, unspecified: Secondary | ICD-10-CM

## 2022-05-15 ENCOUNTER — Ambulatory Visit (INDEPENDENT_AMBULATORY_CARE_PROVIDER_SITE_OTHER): Payer: Medicare Other | Admitting: Internal Medicine

## 2022-05-15 ENCOUNTER — Encounter: Payer: Self-pay | Admitting: Internal Medicine

## 2022-05-15 VITALS — BP 130/76 | HR 95 | Temp 99.0°F | Wt 236.4 lb

## 2022-05-15 DIAGNOSIS — E1169 Type 2 diabetes mellitus with other specified complication: Secondary | ICD-10-CM

## 2022-05-15 DIAGNOSIS — E538 Deficiency of other specified B group vitamins: Secondary | ICD-10-CM

## 2022-05-15 DIAGNOSIS — I10 Essential (primary) hypertension: Secondary | ICD-10-CM

## 2022-05-15 DIAGNOSIS — E782 Mixed hyperlipidemia: Secondary | ICD-10-CM

## 2022-05-15 LAB — POCT GLYCOSYLATED HEMOGLOBIN (HGB A1C): Hemoglobin A1C: 6.9 % — AB (ref 4.0–5.6)

## 2022-05-15 MED ORDER — SIMVASTATIN 20 MG PO TABS: 20.0000 mg | ORAL_TABLET | Freq: Every day | ORAL | 1 refills | Status: DC

## 2022-05-15 NOTE — Progress Notes (Signed)
Established Patient Office Visit     CC/Reason for Visit: 79-month follow-up chronic medical conditions  HPI: Brenda Obrien is a 57 y.o. female who is coming in today for the above mentioned reasons. Past Medical History is significant for: Hypertension, hyperlipidemia, type 2 diabetes, morbid obesity, GERD.  She feels like the simvastatin is causing myalgias.  She is wanting a decrease in dose.  She has been taking 2000 mcg of oral vitamin B-12.  She is otherwise feeling well.   Past Medical/Surgical History: Past Medical History:  Diagnosis Date   Asthma    Cataract    Depression    Diabetes mellitus, type 2 ([place]) 02/2018   Elevated cholesterol    Endometriosis    Generalized anxiety disorder 2017   GERD (gastroesophageal reflux disease)    Glaucoma    HSV-1 infection 06/1998   Hypertension    PTSD (post-traumatic stress disorder) 2017    Past Surgical History:  Procedure Laterality Date   ANTERIOR CRUCIATE LIGAMENT REPAIR  1987,2001,2003,2005   BACK SURGERY  01/2017   due herniated disc   CERVICAL FUSION  10/21/2018   EYE SURGERY Bilateral 2016   release pressure    LAPAROSCOPIC CHOLECYSTECTOMY  5/14   [place], [place]   LAPAROSCOPIC TOTAL HYSTERECTOMY  6/11   left knee surgery  09/2011   NASAL SINUS SURGERY  1992   REPLACEMENT TOTAL KNEE Left 9/14   Dr. Laurance Flatten   SHOULDER SURGERY Right 09/08/2015   due to injury    SHOULDER SURGERY Right 03/20/2016   due to injury   SHOULDER SURGERY Left 09/10/2017   due rotator cuff    Social History:  reports that she has been smoking cigarettes. She has been smoking an average of 1 pack per day. She has never used smokeless tobacco. She reports current alcohol use of about 14.0 - 21.0 standard drinks of alcohol per week. She reports that she does not use drugs.  Allergies: No Known Allergies  Family History:  Family History  Problem Relation Age of Onset   Coronary artery disease Mother        PAD   Heart attack  Mother 10   Diabetes Mother    Lung cancer Father    Heart attack Father 91   Hypertension Brother    High Cholesterol Brother      Current Outpatient Medications:    amLODipine (NORVASC) 5 MG tablet, Take 1 tablet (5 mg total) by mouth daily., Disp: 90 tablet, Rfl: 1   Blood Glucose Monitoring Suppl (CONTOUR MONITOR) w/Device KIT, 1 Device by Does not apply route daily. Patient is to test once daily. Dx E11.9, Disp: 1 kit, Rfl: 1   cyanocobalamin 2000 MCG tablet, Take 2,000 mcg by mouth daily., Disp: , Rfl:    diclofenac sodium (VOLTAREN) 1 % GEL, Apply 3 gm to 3 large joints up to 3 times a day.Dispense 3 tubes with 3 refills., Disp: 5 Tube, Rfl: 0   Dulaglutide (TRULICITY) 4.17 EY/8.1KG SOPN, INJECT 0.75 MG SUBCUTANEOUSLY ONE TIME PER WEEK, Disp: 6 mL, Rfl: 2   ezetimibe (ZETIA) 10 MG tablet, TAKE 1 TABLET BY MOUTH EVERY DAY, Disp: 90 tablet, Rfl: 1   glucose blood (CONTOUR NEXT TEST) test strip, Use to check sugar daily Dx E11.9, Disp: 100 strip, Rfl: 3   lisinopril (ZESTRIL) 40 MG tablet, TAKE 1 TABLET BY MOUTH EVERY DAY, Disp: 30 tablet, Rfl: 5   metFORMIN (GLUCOPHAGE) 1000 MG tablet, TAKE 1 TABLET (1,000  MG TOTAL) BY MOUTH TWICE A DAY WITH FOOD, Disp: 180 tablet, Rfl: 1   Microlet Lancets MISC, USE AS DIRECTED EVERY DAY, Disp: 100 each, Rfl: 11   nebivolol (BYSTOLIC) 5 MG tablet, TAKE 1 TABLET (5 MG TOTAL) BY MOUTH DAILY., Disp: 90 tablet, Rfl: 1   omeprazole (PRILOSEC) 40 MG capsule, TAKE 1 CAPSULE BY MOUTH EVERY DAY, Disp: 90 capsule, Rfl: 1   scopolamine (TRANSDERM-SCOP) 1 MG/3DAYS, Place 1 patch (1.5 mg total) onto the skin every 3 (three) days., Disp: 4 patch, Rfl: 2   timolol (BETIMOL) 0.25 % ophthalmic solution, 1-2 drops 2 (two) times daily., Disp: , Rfl:    simvastatin (ZOCOR) 20 MG tablet, Take 1 tablet (20 mg total) by mouth daily at 6 PM., Disp: 90 tablet, Rfl: 1  Review of Systems:  Negative unless indicated in HPI.   Physical Exam: Vitals:   05/15/22 0702  BP:  130/76  Pulse: 95  Temp: 99 F (37.2 C)  TempSrc: Oral  SpO2: 98%  Weight: 236 lb 6.4 oz (107.2 kg)    Body mass index is 38.16 kg/m.   Physical Exam Vitals reviewed.  Constitutional:      Appearance: Normal appearance.  HENT:     Head: Normocephalic and atraumatic.  Eyes:     Conjunctiva/sclera: Conjunctivae normal.     Pupils: Pupils are equal, round, and reactive to light.  Cardiovascular:     Rate and Rhythm: Normal rate and regular rhythm.  Pulmonary:     Effort: Pulmonary effort is normal.     Breath sounds: Normal breath sounds.  Skin:    General: Skin is warm and dry.  Neurological:     General: No focal deficit present.     Mental Status: She is alert and oriented to person, place, and time.  Psychiatric:        Mood and Affect: Mood normal.        Behavior: Behavior normal.        Thought Content: Thought content normal.        Judgment: Judgment normal.     Diabetic Foot Exam - Simple   Simple Foot Form Diabetic Foot exam was performed with the following findings: Yes 05/15/2022  7:39 AM  Visual Inspection No deformities, no ulcerations, no other skin breakdown bilaterally: Yes Sensation Testing Intact to touch and monofilament testing bilaterally: Yes Pulse Check Posterior Tibialis and Dorsalis pulse intact bilaterally: Yes Comments      Impression and Plan:  Type 2 diabetes mellitus with other specified complication, without long-term current use of insulin (HCC) - Plan: POC HgB A1c  Mixed hyperlipidemia - Plan: simvastatin (ZOCOR) 20 MG tablet  Vitamin B12 deficiency  Primary hypertension  Morbid obesity (HCC)  -A1c of 6.9 demonstrates excellent diabetic control. -Blood pressure is well-controlled. -Per request will decrease simvastatin from 40 to 20 mg due to myalgias and we will recheck cholesterol in 3 months. -Recheck B12 levels in 3 months.  Time spent:31 minutes reviewing chart, interviewing and examining patient and formulating  plan of care.     Lelon Frohlich, MD [facility] at [facility]

## 2022-05-18 ENCOUNTER — Other Ambulatory Visit: Payer: Self-pay | Admitting: Internal Medicine

## 2022-05-18 DIAGNOSIS — I10 Essential (primary) hypertension: Secondary | ICD-10-CM

## 2022-05-22 LAB — HM DIABETES EYE EXAM

## 2022-06-16 ENCOUNTER — Other Ambulatory Visit: Payer: Self-pay | Admitting: Internal Medicine

## 2022-06-16 DIAGNOSIS — I10 Essential (primary) hypertension: Secondary | ICD-10-CM

## 2022-08-03 ENCOUNTER — Other Ambulatory Visit: Payer: Self-pay | Admitting: Internal Medicine

## 2022-08-03 DIAGNOSIS — K219 Gastro-esophageal reflux disease without esophagitis: Secondary | ICD-10-CM

## 2022-08-16 ENCOUNTER — Other Ambulatory Visit: Payer: Self-pay | Admitting: Internal Medicine

## 2022-08-16 DIAGNOSIS — E1165 Type 2 diabetes mellitus with hyperglycemia: Secondary | ICD-10-CM

## 2022-08-21 ENCOUNTER — Encounter: Payer: Self-pay | Admitting: Internal Medicine

## 2022-08-21 ENCOUNTER — Ambulatory Visit (INDEPENDENT_AMBULATORY_CARE_PROVIDER_SITE_OTHER): Payer: Medicare Other | Admitting: Internal Medicine

## 2022-08-21 VITALS — BP 150/79 | HR 96 | Temp 98.8°F | Wt 238.3 lb

## 2022-08-21 DIAGNOSIS — Z6838 Body mass index (BMI) 38.0-38.9, adult: Secondary | ICD-10-CM

## 2022-08-21 DIAGNOSIS — E1169 Type 2 diabetes mellitus with other specified complication: Secondary | ICD-10-CM

## 2022-08-21 DIAGNOSIS — F332 Major depressive disorder, recurrent severe without psychotic features: Secondary | ICD-10-CM

## 2022-08-21 DIAGNOSIS — E782 Mixed hyperlipidemia: Secondary | ICD-10-CM

## 2022-08-21 DIAGNOSIS — I10 Essential (primary) hypertension: Secondary | ICD-10-CM | POA: Diagnosis not present

## 2022-08-21 DIAGNOSIS — Z7984 Long term (current) use of oral hypoglycemic drugs: Secondary | ICD-10-CM | POA: Diagnosis not present

## 2022-08-21 DIAGNOSIS — E538 Deficiency of other specified B group vitamins: Secondary | ICD-10-CM

## 2022-08-21 LAB — LIPID PANEL
Cholesterol: 158 mg/dL (ref 0–200)
HDL: 57.4 mg/dL (ref >39.00)
LDL Cholesterol: 81 mg/dL (ref 0–99)
NonHDL: 100.99
Total CHOL/HDL Ratio: 3
Triglycerides: 102 mg/dL (ref 0.0–149.0)
VLDL: 20.4 mg/dL (ref 0.0–40.0)

## 2022-08-21 LAB — POCT GLYCOSYLATED HEMOGLOBIN (HGB A1C): Hemoglobin A1C: 7.1 % — AB (ref 4.0–5.6)

## 2022-08-21 LAB — VITAMIN B12: Vitamin B-12: 164 pg/mL — ABNORMAL LOW (ref 211–911)

## 2022-08-21 NOTE — Assessment & Plan Note (Signed)
Myalgias have improved with reduction in simvastatin dose from 40 to 20 mg. Recheck lipids today. Consider adding ezetimibe if LDL >70.

## 2022-08-21 NOTE — Assessment & Plan Note (Signed)
Discussed healthy lifestyle, including increased physical activity and better food choices to promote weight loss.  

## 2022-08-21 NOTE — Assessment & Plan Note (Signed)
BP elevated today on several measurements. She will do ambulatory BP measurements and return in 3 months for follow up to see if medication changes are needed.

## 2022-08-21 NOTE — Assessment & Plan Note (Signed)
A1c has increased to 7.1. She will work on lifestyle changes and return in 3 months for follow up.

## 2022-08-21 NOTE — Assessment & Plan Note (Signed)
Mood is stable. Followed by mental health provider.

## 2022-08-21 NOTE — Progress Notes (Signed)
Established Patient Office Visit     CC/Reason for Visit: 72-month follow-up chronic conditions  HPI: Brenda Obrien is a 57 y.o. female who is coming in today for the above mentioned reasons. Past Medical History is significant for: Hypertension, hyperlipidemia, type 2 diabetes, morbid obesity, GERD.  She is feeling well.  Myalgias have significantly improved after decreasing simvastatin dose from 40-20.  She is due to have lipid recheck today.  She is requesting B12 levels checked today.  Blood pressure is elevated today on 2 separate measurements.  She has recently had normal blood pressure readings, at home this morning was 130/78.   Past Medical/Surgical History: Past Medical History:  Diagnosis Date   Asthma    Cataract    Depression    Diabetes mellitus, type 2 (HCC) 02/2018   Elevated cholesterol    Endometriosis    Generalized anxiety disorder 2017   GERD (gastroesophageal reflux disease)    Glaucoma    HSV-1 infection 06/1998   Hypertension    PTSD (post-traumatic stress disorder) 2017    Past Surgical History:  Procedure Laterality Date   ANTERIOR CRUCIATE LIGAMENT REPAIR  1987,2001,2003,2005   BACK SURGERY  01/2017   due herniated disc   CERVICAL FUSION  10/21/2018   EYE SURGERY Bilateral 2016   release pressure    LAPAROSCOPIC CHOLECYSTECTOMY  5/14   [place], [place]   LAPAROSCOPIC TOTAL HYSTERECTOMY  6/11   left knee surgery  09/2011   NASAL SINUS SURGERY  1992   REPLACEMENT TOTAL KNEE Left 9/14   Dr. Elliot Dally   SHOULDER SURGERY Right 09/08/2015   due to injury    SHOULDER SURGERY Right 03/20/2016   due to injury   SHOULDER SURGERY Left 09/10/2017   due rotator cuff    Social History:  reports that she has been smoking cigarettes. She has been smoking an average of 1 pack per day. She has never used smokeless tobacco. She reports current alcohol use of about 14.0 - 21.0 standard drinks of alcohol per week. She reports that she does not use  drugs.  Allergies: No Known Allergies  Family History:  Family History  Problem Relation Age of Onset   Coronary artery disease Mother        PAD   Heart attack Mother 57   Diabetes Mother    Lung cancer Father    Heart attack Father 52   Hypertension Brother    High Cholesterol Brother      Current Outpatient Medications:    amLODipine (NORVASC) 5 MG tablet, TAKE 1 TABLET (5 MG TOTAL) BY MOUTH DAILY., Disp: 90 tablet, Rfl: 1   Blood Glucose Monitoring Suppl (CONTOUR MONITOR) w/Device KIT, 1 Device by Does not apply route daily. Patient is to test once daily. Dx E11.9, Disp: 1 kit, Rfl: 1   cyanocobalamin 2000 MCG tablet, Take 2,000 mcg by mouth daily., Disp: , Rfl:    diclofenac sodium (VOLTAREN) 1 % GEL, Apply 3 gm to 3 large joints up to 3 times a day.Dispense 3 tubes with 3 refills., Disp: 5 Tube, Rfl: 0   Dulaglutide (TRULICITY) 0.75 MG/0.5ML SOPN, INJECT 0.75 MG SUBCUTANEOUSLY ONE TIME PER WEEK, Disp: 6 mL, Rfl: 2   ezetimibe (ZETIA) 10 MG tablet, TAKE 1 TABLET BY MOUTH EVERY DAY, Disp: 90 tablet, Rfl: 1   glucose blood (CONTOUR NEXT TEST) test strip, Use to check sugar daily Dx E11.9, Disp: 100 strip, Rfl: 3   lisinopril (ZESTRIL) 40 MG tablet,  TAKE 1 TABLET BY MOUTH EVERY DAY, Disp: 90 tablet, Rfl: 1   metFORMIN (GLUCOPHAGE) 1000 MG tablet, TAKE 1 TABLET (1,000 MG TOTAL) BY MOUTH TWICE A DAY WITH FOOD, Disp: 180 tablet, Rfl: 1   Microlet Lancets MISC, USE AS DIRECTED EVERY DAY, Disp: 100 each, Rfl: 11   nebivolol (BYSTOLIC) 5 MG tablet, TAKE 1 TABLET (5 MG TOTAL) BY MOUTH DAILY., Disp: 90 tablet, Rfl: 1   omeprazole (PRILOSEC) 40 MG capsule, TAKE 1 CAPSULE BY MOUTH EVERY DAY, Disp: 90 capsule, Rfl: 1   scopolamine (TRANSDERM-SCOP) 1 MG/3DAYS, Place 1 patch (1.5 mg total) onto the skin every 3 (three) days., Disp: 4 patch, Rfl: 2   simvastatin (ZOCOR) 20 MG tablet, Take 1 tablet (20 mg total) by mouth daily at 6 PM., Disp: 90 tablet, Rfl: 1   timolol (BETIMOL) 0.25 %  ophthalmic solution, 1-2 drops 2 (two) times daily., Disp: , Rfl:   Review of Systems:  Negative unless indicated in HPI.   Physical Exam: Vitals:   08/21/22 0707 08/21/22 0708  BP: (!) 155/93 (!) 150/79  Pulse: 96   Temp: 98.8 F (37.1 C)   TempSrc: Oral   SpO2: 96%   Weight: 238 lb 4.8 oz (108.1 kg)     Body mass index is 38.46 kg/m.   Physical Exam Vitals reviewed.  Constitutional:      Appearance: Normal appearance.  HENT:     Head: Normocephalic and atraumatic.  Eyes:     Conjunctiva/sclera: Conjunctivae normal.     Pupils: Pupils are equal, round, and reactive to light.  Cardiovascular:     Rate and Rhythm: Normal rate and regular rhythm.  Pulmonary:     Effort: Pulmonary effort is normal.     Breath sounds: Normal breath sounds.  Skin:    General: Skin is warm and dry.  Neurological:     General: No focal deficit present.     Mental Status: She is alert and oriented to person, place, and time.  Psychiatric:        Mood and Affect: Mood normal.        Behavior: Behavior normal.        Thought Content: Thought content normal.        Judgment: Judgment normal.      Impression and Plan:  Type 2 diabetes mellitus with other specified complication, without long-term current use of insulin (HCC) Assessment & Plan: A1c has increased to 7.1. She will work on lifestyle changes and return in 3 months for follow up.  Orders: -     POCT glycosylated hemoglobin (Hb A1C)  Primary hypertension Assessment & Plan: BP elevated today on several measurements. She will do ambulatory BP measurements and return in 3 months for follow up to see if medication changes are needed.   Vitamin B12 deficiency Assessment & Plan: Check B12 levels today.  Orders: -     Vitamin B12; Future  Mixed hyperlipidemia Assessment & Plan: Myalgias have improved with reduction in simvastatin dose from 40 to 20 mg. Recheck lipids today. Consider adding ezetimibe if LDL  >70.  Orders: -     Lipid panel; Future  Morbid obesity (HCC) Assessment & Plan: -Discussed healthy lifestyle, including increased physical activity and better food choices to promote weight loss.    Severe episode of recurrent major depressive disorder, without psychotic features [facility]) Assessment & Plan: Mood is stable. Followed by mental health provider.      Time spent:32 minutes reviewing chart, interviewing and  examining patient and formulating plan of care.     Chaya Jan, MD [facility]

## 2022-08-21 NOTE — Assessment & Plan Note (Signed)
Check B12 levels today 

## 2022-08-26 ENCOUNTER — Ambulatory Visit (INDEPENDENT_AMBULATORY_CARE_PROVIDER_SITE_OTHER): Payer: Medicare Other

## 2022-08-26 DIAGNOSIS — E538 Deficiency of other specified B group vitamins: Secondary | ICD-10-CM | POA: Diagnosis not present

## 2022-08-26 MED ORDER — CYANOCOBALAMIN 1000 MCG/ML IJ SOLN
1000.0000 ug | Freq: Once | INTRAMUSCULAR | Status: AC
  Administered 2022-08-26: 1000 ug via INTRAMUSCULAR

## 2022-08-26 NOTE — Progress Notes (Signed)
Per orders of Dr. Hernandez, injection of Cyanocobalamin 1000 mcg given by Mehlani Blankenburg L Jakari Jacot. Patient tolerated injection well.  

## 2022-09-03 ENCOUNTER — Ambulatory Visit (INDEPENDENT_AMBULATORY_CARE_PROVIDER_SITE_OTHER): Payer: Medicare Other

## 2022-09-03 DIAGNOSIS — E538 Deficiency of other specified B group vitamins: Secondary | ICD-10-CM

## 2022-09-03 MED ORDER — CYANOCOBALAMIN 1000 MCG/ML IJ SOLN
1000.0000 ug | Freq: Once | INTRAMUSCULAR | Status: AC
Start: 2022-09-03 — End: 2022-09-03
  Administered 2022-09-03: 1000 ug via INTRAMUSCULAR

## 2022-09-03 NOTE — Progress Notes (Signed)
Per orders of Dr. Hernandez, injection of Cyanocobalamin 1000 mcg given by Evonne Rinks L Amylia Collazos. Patient tolerated injection well.  

## 2022-09-10 ENCOUNTER — Ambulatory Visit (INDEPENDENT_AMBULATORY_CARE_PROVIDER_SITE_OTHER): Payer: Medicare Other

## 2022-09-10 DIAGNOSIS — E538 Deficiency of other specified B group vitamins: Secondary | ICD-10-CM | POA: Diagnosis not present

## 2022-09-10 MED ORDER — CYANOCOBALAMIN 1000 MCG/ML IJ SOLN
1000.0000 ug | Freq: Once | INTRAMUSCULAR | Status: AC
Start: 2022-09-10 — End: 2022-09-10
  Administered 2022-09-10: 1000 ug via INTRAMUSCULAR

## 2022-09-10 NOTE — Progress Notes (Signed)
Pt here for monthly B12 injection per Dr Ardyth Harps  B12 given IM and pt tolerated injection well.  Next B12 injection scheduled for 09/17/22

## 2022-09-17 ENCOUNTER — Ambulatory Visit (INDEPENDENT_AMBULATORY_CARE_PROVIDER_SITE_OTHER): Payer: Medicare Other

## 2022-09-17 DIAGNOSIS — E538 Deficiency of other specified B group vitamins: Secondary | ICD-10-CM | POA: Diagnosis not present

## 2022-09-17 MED ORDER — CYANOCOBALAMIN 1000 MCG/ML IJ SOLN
1000.0000 ug | Freq: Once | INTRAMUSCULAR | Status: AC
Start: 2022-09-17 — End: 2022-09-17
  Administered 2022-09-17: 1000 ug via INTRAMUSCULAR

## 2022-09-17 NOTE — Progress Notes (Signed)
Pt here for monthly B12 injection per Dr. Ardyth Harps  B12 given IM and pt tolerated injection well.  Next B12 injection scheduled for 10/18/22.

## 2022-10-07 ENCOUNTER — Other Ambulatory Visit: Payer: Self-pay | Admitting: Internal Medicine

## 2022-10-18 ENCOUNTER — Ambulatory Visit (INDEPENDENT_AMBULATORY_CARE_PROVIDER_SITE_OTHER): Payer: Medicare Other

## 2022-10-18 DIAGNOSIS — E538 Deficiency of other specified B group vitamins: Secondary | ICD-10-CM

## 2022-10-18 MED ORDER — CYANOCOBALAMIN 1000 MCG/ML IJ SOLN
1000.00 ug | Freq: Once | INTRAMUSCULAR | Status: AC
Start: 2022-10-18 — End: 2022-10-18
  Administered 2022-10-18: 1000 ug via INTRAMUSCULAR

## 2022-10-18 NOTE — Patient Instructions (Signed)
Health Maintenance Due  Topic Date Due   Medicare Annual Wellness (AWV)  Never done   Zoster Vaccines- Shingrix (1 of 2) Never done   COVID-19 Vaccine (4 - 2023-24 season) 12/14/2021      Row Labels 08/21/2022    7:12 AM 05/07/2021    8:06 AM 12/03/2019    8:42 AM  Depression screen PHQ 2/9   Section Header. No data exists in this row.     Decreased Interest   1 1 0  Down, Depressed, Hopeless   1 2 0  PHQ - 2 Score   2 3 0  Altered sleeping   2 2 0  Tired, decreased energy    2 0  Change in appetite    1 0  Feeling bad or failure about yourself     1 0  Trouble concentrating    1 0  Moving slowly or fidgety/restless    0 0  Suicidal thoughts    0 0  PHQ-9 Score   4 10 0  Difficult doing work/chores    Somewhat difficult Not difficult at all

## 2022-10-22 NOTE — Progress Notes (Signed)
Per orders of Philip Aspen, Limmie Patricia, MD, injection of B12 given in Right  deltoid by Sherrin Daisy. Patient tolerated injection well.  Lab Results  Component Value Date   VITAMINB12 164 (L) 08/21/2022

## 2022-11-04 ENCOUNTER — Other Ambulatory Visit: Payer: Self-pay | Admitting: Internal Medicine

## 2022-11-04 DIAGNOSIS — E785 Hyperlipidemia, unspecified: Secondary | ICD-10-CM

## 2022-11-07 ENCOUNTER — Telehealth: Payer: Self-pay | Admitting: Internal Medicine

## 2022-11-07 MED ORDER — SCOPOLAMINE 1 MG/3DAYS TD PT72: 1.0000 | MEDICATED_PATCH | TRANSDERMAL | 0 refills | Status: AC

## 2022-11-07 NOTE — Telephone Encounter (Signed)
Requesting motion sickness patches, going on a trip and will be on the water

## 2022-11-07 NOTE — Addendum Note (Signed)
Addended by: Kern Reap B on: 11/07/2022 03:11 PM   Modules accepted: Orders

## 2022-11-11 ENCOUNTER — Other Ambulatory Visit: Payer: Self-pay | Admitting: Internal Medicine

## 2022-11-11 DIAGNOSIS — E782 Mixed hyperlipidemia: Secondary | ICD-10-CM

## 2022-11-11 DIAGNOSIS — I10 Essential (primary) hypertension: Secondary | ICD-10-CM

## 2022-11-20 ENCOUNTER — Encounter: Payer: Self-pay | Admitting: Internal Medicine

## 2022-11-20 ENCOUNTER — Ambulatory Visit (INDEPENDENT_AMBULATORY_CARE_PROVIDER_SITE_OTHER): Payer: Medicare Other | Admitting: Internal Medicine

## 2022-11-20 VITALS — BP 153/86 | HR 91 | Temp 98.7°F | Wt 239.6 lb

## 2022-11-20 DIAGNOSIS — G4709 Other insomnia: Secondary | ICD-10-CM

## 2022-11-20 DIAGNOSIS — E782 Mixed hyperlipidemia: Secondary | ICD-10-CM

## 2022-11-20 DIAGNOSIS — E538 Deficiency of other specified B group vitamins: Secondary | ICD-10-CM

## 2022-11-20 DIAGNOSIS — I10 Essential (primary) hypertension: Secondary | ICD-10-CM | POA: Diagnosis not present

## 2022-11-20 DIAGNOSIS — E1169 Type 2 diabetes mellitus with other specified complication: Secondary | ICD-10-CM | POA: Diagnosis not present

## 2022-11-20 LAB — POCT GLYCOSYLATED HEMOGLOBIN (HGB A1C): Hemoglobin A1C: 7.1 % — AB (ref 4.0–5.6)

## 2022-11-20 MED ORDER — CYANOCOBALAMIN 1000 MCG/ML IJ SOLN
INTRAMUSCULAR | 11 refills | Status: AC
Start: 2022-11-20 — End: ?

## 2022-11-20 MED ORDER — VALSARTAN-HYDROCHLOROTHIAZIDE 160-25 MG PO TABS
1.0000 | ORAL_TABLET | Freq: Every day | ORAL | 1 refills | Status: DC
Start: 2022-11-20 — End: 2022-12-03

## 2022-11-20 MED ORDER — CYANOCOBALAMIN 1000 MCG/ML IJ SOLN
1000.0000 ug | Freq: Once | INTRAMUSCULAR | Status: AC
Start: 2022-11-20 — End: 2022-11-20
  Administered 2022-11-20: 1000 ug via INTRAMUSCULAR

## 2022-11-20 NOTE — Addendum Note (Signed)
Addended by: Kern Reap B on: 11/20/2022 08:15 AM   Modules accepted: Orders

## 2022-11-20 NOTE — Assessment & Plan Note (Signed)
IM B12 administered in office today.

## 2022-11-20 NOTE — Assessment & Plan Note (Signed)
On maximum tolerated statin dose due to myalgias.

## 2022-11-20 NOTE — Progress Notes (Signed)
Established Patient Office Visit     CC/Reason for Visit: 35-month follow-up chronic medical conditions  HPI: Brenda Obrien is a 57 y.o. female who is coming in today for the above mentioned reasons. Past Medical History is significant for: Hypertension, hyperlipidemia, type 2 diabetes, obesity, GERD.  She has been feeling relatively well.   Past Medical/Surgical History: Past Medical History:  Diagnosis Date   Asthma    Cataract    Depression    Diabetes mellitus, type 2 (HCC) 02/2018   Elevated cholesterol    Endometriosis    Generalized anxiety disorder 2017   GERD (gastroesophageal reflux disease)    Glaucoma    HSV-1 infection 06/1998   Hypertension    PTSD (post-traumatic stress disorder) 2017    Past Surgical History:  Procedure Laterality Date   ANTERIOR CRUCIATE LIGAMENT REPAIR  1987,2001,2003,2005   BACK SURGERY  01/2017   due herniated disc   CERVICAL FUSION  10/21/2018   EYE SURGERY Bilateral 2016   release pressure    LAPAROSCOPIC CHOLECYSTECTOMY  5/14   [place], [place]   LAPAROSCOPIC TOTAL HYSTERECTOMY  6/11   left knee surgery  09/2011   NASAL SINUS SURGERY  1992   REPLACEMENT TOTAL KNEE Left 9/14   Dr. Elliot Dally   SHOULDER SURGERY Right 09/08/2015   due to injury    SHOULDER SURGERY Right 03/20/2016   due to injury   SHOULDER SURGERY Left 09/10/2017   due rotator cuff    Social History:  reports that she has been smoking cigarettes. She has never used smokeless tobacco. She reports current alcohol use of about 14.0 - 21.0 standard drinks of alcohol per week. She reports that she does not use drugs.  Allergies: No Known Allergies  Family History:  Family History  Problem Relation Age of Onset   Coronary artery disease Mother        PAD   Heart attack Mother 17   Diabetes Mother    Lung cancer Father    Heart attack Father 95   Hypertension Brother    High Cholesterol Brother      Current Outpatient Medications:    amLODipine  (NORVASC) 5 MG tablet, TAKE 1 TABLET (5 MG TOTAL) BY MOUTH DAILY., Disp: 90 tablet, Rfl: 1   Blood Glucose Monitoring Suppl (CONTOUR MONITOR) w/Device KIT, 1 Device by Does not apply route daily. Patient is to test once daily. Dx E11.9, Disp: 1 kit, Rfl: 1   cyanocobalamin (VITAMIN B12) 1000 MCG/ML injection, Inject 1 ml into the muscle once a week for a month.  Then injected 1 ml once a month thereafter, Disp: 6 mL, Rfl: 11   cyanocobalamin 2000 MCG tablet, Take 2,000 mcg by mouth daily., Disp: , Rfl:    diclofenac sodium (VOLTAREN) 1 % GEL, Apply 3 gm to 3 large joints up to 3 times a day.Dispense 3 tubes with 3 refills., Disp: 5 Tube, Rfl: 0   Dulaglutide (TRULICITY) 0.75 MG/0.5ML SOPN, INJECT 0.75 MG SUBCUTANEOUSLY ONE TIME PER WEEK, Disp: 6 mL, Rfl: 2   ezetimibe (ZETIA) 10 MG tablet, TAKE 1 TABLET BY MOUTH EVERY DAY, Disp: 90 tablet, Rfl: 1   glucose blood (CONTOUR NEXT TEST) test strip, Use to check sugar daily Dx E11.9, Disp: 100 strip, Rfl: 3   metFORMIN (GLUCOPHAGE) 1000 MG tablet, TAKE 1 TABLET (1,000 MG TOTAL) BY MOUTH TWICE A DAY WITH FOOD, Disp: 180 tablet, Rfl: 1   Microlet Lancets MISC, USE AS DIRECTED EVERY DAY,  Disp: 100 each, Rfl: 11   nebivolol (BYSTOLIC) 5 MG tablet, TAKE 1 TABLET (5 MG TOTAL) BY MOUTH DAILY., Disp: 90 tablet, Rfl: 1   omeprazole (PRILOSEC) 40 MG capsule, TAKE 1 CAPSULE BY MOUTH EVERY DAY, Disp: 90 capsule, Rfl: 1   scopolamine (TRANSDERM-SCOP) 1 MG/3DAYS, Place 1 patch (1.5 mg total) onto the skin every 3 (three) days., Disp: 10 patch, Rfl: 0   simvastatin (ZOCOR) 20 MG tablet, TAKE 1 TABLET BY MOUTH DAILY AT 6 PM., Disp: 90 tablet, Rfl: 1   timolol (BETIMOL) 0.25 % ophthalmic solution, 1-2 drops 2 (two) times daily., Disp: , Rfl:    valsartan-hydrochlorothiazide (DIOVAN-HCT) 160-25 MG tablet, Take 1 tablet by mouth daily., Disp: 90 tablet, Rfl: 1  Review of Systems:  Negative unless indicated in HPI.   Physical Exam: Vitals:   11/20/22 0705  BP: (!)  153/86  Pulse: 91  Temp: 98.7 F (37.1 C)  TempSrc: Oral  SpO2: 96%  Weight: 239 lb 9.6 oz (108.7 kg)    Body mass index is 38.67 kg/m.   Physical Exam Vitals reviewed.  Constitutional:      Appearance: Normal appearance.  HENT:     Head: Normocephalic and atraumatic.  Eyes:     Conjunctiva/sclera: Conjunctivae normal.     Pupils: Pupils are equal, round, and reactive to light.  Cardiovascular:     Rate and Rhythm: Normal rate and regular rhythm.  Pulmonary:     Effort: Pulmonary effort is normal.     Breath sounds: Normal breath sounds.  Skin:    General: Skin is warm and dry.  Neurological:     General: No focal deficit present.     Mental Status: She is alert and oriented to person, place, and time.  Psychiatric:        Mood and Affect: Mood normal.        Behavior: Behavior normal.        Thought Content: Thought content normal.        Judgment: Judgment normal.      Impression and Plan:  Vitamin B12 deficiency Assessment & Plan: IM B12 administered in office today.  Orders: -     Cyanocobalamin; Inject 1 ml into the muscle once a week for a month.  Then injected 1 ml once a month thereafter  Dispense: 6 mL; Refill: 11  Type 2 diabetes mellitus with other specified complication, without long-term current use of insulin (HCC) Assessment & Plan: Fairly well-controlled with an A1c of 7.1.  Continue to work on lifestyle changes.  Orders: -     POCT glycosylated hemoglobin (Hb A1C)  Mixed hyperlipidemia Assessment & Plan: On maximum tolerated statin dose due to myalgias.   Other insomnia  Primary hypertension Assessment & Plan: Not well-controlled.  Continue amlodipine and nebivolol.  Discontinue lisinopril and start Diovan HCT.  Return in 3 months for follow-up.  Orders: -     Valsartan-hydroCHLOROthiazide; Take 1 tablet by mouth daily.  Dispense: 90 tablet; Refill: 1     Time spent:32 minutes reviewing chart, interviewing and examining patient  and formulating plan of care.     Chaya Jan, MD [facility] at [facility]

## 2022-11-20 NOTE — Assessment & Plan Note (Signed)
Not well-controlled.  Continue amlodipine and nebivolol.  Discontinue lisinopril and start Diovan HCT.  Return in 3 months for follow-up.

## 2022-11-20 NOTE — Assessment & Plan Note (Signed)
Fairly well-controlled with an A1c of 7.1.  Continue to work on lifestyle changes.

## 2022-11-28 ENCOUNTER — Encounter (INDEPENDENT_AMBULATORY_CARE_PROVIDER_SITE_OTHER): Payer: Self-pay

## 2022-12-03 ENCOUNTER — Encounter: Payer: Self-pay | Admitting: Internal Medicine

## 2022-12-03 ENCOUNTER — Ambulatory Visit (INDEPENDENT_AMBULATORY_CARE_PROVIDER_SITE_OTHER): Payer: Medicare Other | Admitting: Internal Medicine

## 2022-12-03 VITALS — BP 122/78 | HR 98 | Temp 98.7°F | Ht 66.0 in | Wt 234.6 lb

## 2022-12-03 DIAGNOSIS — I10 Essential (primary) hypertension: Secondary | ICD-10-CM | POA: Diagnosis not present

## 2022-12-03 MED ORDER — LISINOPRIL 40 MG PO TABS
40.0000 mg | ORAL_TABLET | Freq: Every day | ORAL | 1 refills | Status: DC
Start: 2022-12-03 — End: 2023-06-12

## 2022-12-03 NOTE — Progress Notes (Signed)
Established Patient Office Visit     CC/Reason for Visit: Follow-up blood pressure, nausea  HPI: Brenda Obrien is a 57 y.o. female who is coming in today for the above mentioned reasons. Past Medical History is significant for: Hypertension among other issues.  During her last visit on August 7 we decided to take her off lisinopril and placed her on Diovan HCT instead due to suboptimal blood pressure management.  She took it for 2 days and started experiencing nausea.  She discontinued and resumed her prior dose of 40 mg of lisinopril and has been doing well.  Blood pressure yesterday at home was 114/62, in office today 122/78.   Past Medical/Surgical History: Past Medical History:  Diagnosis Date   Asthma    Cataract    Depression    Diabetes mellitus, type 2 (HCC) 02/2018   Elevated cholesterol    Endometriosis    Generalized anxiety disorder 2017   GERD (gastroesophageal reflux disease)    Glaucoma    HSV-1 infection 06/1998   Hypertension    PTSD (post-traumatic stress disorder) 2017    Past Surgical History:  Procedure Laterality Date   ANTERIOR CRUCIATE LIGAMENT REPAIR  1987,2001,2003,2005   BACK SURGERY  01/2017   due herniated disc   CERVICAL FUSION  10/21/2018   EYE SURGERY Bilateral 2016   release pressure    LAPAROSCOPIC CHOLECYSTECTOMY  5/14   [place], [place]   LAPAROSCOPIC TOTAL HYSTERECTOMY  6/11   left knee surgery  09/2011   NASAL SINUS SURGERY  1992   REPLACEMENT TOTAL KNEE Left 9/14   Dr. Elliot Dally   SHOULDER SURGERY Right 09/08/2015   due to injury    SHOULDER SURGERY Right 03/20/2016   due to injury   SHOULDER SURGERY Left 09/10/2017   due rotator cuff    Social History:  reports that she has been smoking cigarettes. She has never used smokeless tobacco. She reports current alcohol use of about 14.0 - 21.0 standard drinks of alcohol per week. She reports that she does not use drugs.  Allergies: No Known Allergies  Family History:   Family History  Problem Relation Age of Onset   Coronary artery disease Mother        PAD   Heart attack Mother 80   Diabetes Mother    Lung cancer Father    Heart attack Father 19   Hypertension Brother    High Cholesterol Brother      Current Outpatient Medications:    amLODipine (NORVASC) 5 MG tablet, TAKE 1 TABLET (5 MG TOTAL) BY MOUTH DAILY., Disp: 90 tablet, Rfl: 1   Blood Glucose Monitoring Suppl (CONTOUR MONITOR) w/Device KIT, 1 Device by Does not apply route daily. Patient is to test once daily. Dx E11.9, Disp: 1 kit, Rfl: 1   cyanocobalamin (VITAMIN B12) 1000 MCG/ML injection, Inject 1 ml into the muscle once a week for a month.  Then injected 1 ml once a month thereafter, Disp: 6 mL, Rfl: 11   cyanocobalamin 2000 MCG tablet, Take 2,000 mcg by mouth daily., Disp: , Rfl:    diclofenac sodium (VOLTAREN) 1 % GEL, Apply 3 gm to 3 large joints up to 3 times a day.Dispense 3 tubes with 3 refills., Disp: 5 Tube, Rfl: 0   Dulaglutide (TRULICITY) 0.75 MG/0.5ML SOPN, INJECT 0.75 MG SUBCUTANEOUSLY ONE TIME PER WEEK, Disp: 6 mL, Rfl: 2   ezetimibe (ZETIA) 10 MG tablet, TAKE 1 TABLET BY MOUTH EVERY DAY, Disp: 90 tablet,  Rfl: 1   glucose blood (CONTOUR NEXT TEST) test strip, Use to check sugar daily Dx E11.9, Disp: 100 strip, Rfl: 3   lisinopril (ZESTRIL) 40 MG tablet, Take 1 tablet (40 mg total) by mouth daily., Disp: 90 tablet, Rfl: 1   metFORMIN (GLUCOPHAGE) 1000 MG tablet, TAKE 1 TABLET (1,000 MG TOTAL) BY MOUTH TWICE A DAY WITH FOOD, Disp: 180 tablet, Rfl: 1   Microlet Lancets MISC, USE AS DIRECTED EVERY DAY, Disp: 100 each, Rfl: 11   nebivolol (BYSTOLIC) 5 MG tablet, TAKE 1 TABLET (5 MG TOTAL) BY MOUTH DAILY., Disp: 90 tablet, Rfl: 1   omeprazole (PRILOSEC) 40 MG capsule, TAKE 1 CAPSULE BY MOUTH EVERY DAY, Disp: 90 capsule, Rfl: 1   scopolamine (TRANSDERM-SCOP) 1 MG/3DAYS, Place 1 patch (1.5 mg total) onto the skin every 3 (three) days., Disp: 10 patch, Rfl: 0   simvastatin (ZOCOR) 20  MG tablet, TAKE 1 TABLET BY MOUTH DAILY AT 6 PM., Disp: 90 tablet, Rfl: 1   timolol (BETIMOL) 0.25 % ophthalmic solution, 1-2 drops 2 (two) times daily., Disp: , Rfl:   Review of Systems:  Negative unless indicated in HPI.   Physical Exam: Vitals:   12/03/22 1025  BP: 122/78  Pulse: 98  Temp: 98.7 F (37.1 C)  TempSrc: Oral  SpO2: 98%  Weight: 234 lb 9.6 oz (106.4 kg)  Height: 5\' 6"  (1.676 m)    Body mass index is 37.87 kg/m.   Physical Exam Vitals reviewed.  Constitutional:      Appearance: Normal appearance.  HENT:     Head: Normocephalic and atraumatic.  Eyes:     Conjunctiva/sclera: Conjunctivae normal.     Pupils: Pupils are equal, round, and reactive to light.  Skin:    General: Skin is warm and dry.  Neurological:     General: No focal deficit present.     Mental Status: She is alert and oriented to person, place, and time.  Psychiatric:        Mood and Affect: Mood normal.        Behavior: Behavior normal.        Thought Content: Thought content normal.        Judgment: Judgment normal.      Impression and Plan:  Primary hypertension -     Lisinopril; Take 1 tablet (40 mg total) by mouth daily.  Dispense: 90 tablet; Refill: 1   -Not sure that Diovan HCT would be the cause of her nausea, nonetheless discontinue and resume lisinopril.  She will continue to monitor blood pressure and return to see me in 3 to 4 months for follow-up.  Time spent:21 minutes reviewing chart, interviewing and examining patient and formulating plan of care.     Chaya Jan, MD [facility]

## 2022-12-24 ENCOUNTER — Ambulatory Visit (INDEPENDENT_AMBULATORY_CARE_PROVIDER_SITE_OTHER): Payer: Medicare Other

## 2022-12-24 DIAGNOSIS — E538 Deficiency of other specified B group vitamins: Secondary | ICD-10-CM

## 2022-12-24 MED ORDER — CYANOCOBALAMIN 1000 MCG/ML IJ SOLN
1000.0000 ug | Freq: Once | INTRAMUSCULAR | Status: AC
Start: 2022-12-24 — End: 2022-12-24
  Administered 2022-12-24: 1000 ug via INTRAMUSCULAR

## 2022-12-24 NOTE — Progress Notes (Signed)
Per orders of Dr. Ardyth Harps, injection of B12  given by Stann Ore. Patient tolerated injection well.

## 2022-12-28 ENCOUNTER — Other Ambulatory Visit: Payer: Self-pay | Admitting: Internal Medicine

## 2022-12-28 DIAGNOSIS — E1169 Type 2 diabetes mellitus with other specified complication: Secondary | ICD-10-CM

## 2023-01-23 ENCOUNTER — Ambulatory Visit: Payer: Medicare Other

## 2023-01-23 DIAGNOSIS — E538 Deficiency of other specified B group vitamins: Secondary | ICD-10-CM

## 2023-01-23 DIAGNOSIS — Z23 Encounter for immunization: Secondary | ICD-10-CM

## 2023-01-23 MED ORDER — CYANOCOBALAMIN 1000 MCG/ML IJ SOLN
1000.0000 ug | Freq: Once | INTRAMUSCULAR | Status: AC
Start: 2023-01-23 — End: 2023-01-23
  Administered 2023-01-23: 1000 ug via INTRAMUSCULAR

## 2023-01-23 NOTE — Progress Notes (Signed)
Per orders of Dr. Hernandez, injection of Cyanocobalamin 1000 mcg given by Kazden Largo L Maliya Marich. Patient tolerated injection well.  

## 2023-02-07 ENCOUNTER — Ambulatory Visit (INDEPENDENT_AMBULATORY_CARE_PROVIDER_SITE_OTHER): Payer: Medicare Other

## 2023-02-07 DIAGNOSIS — Z0000 Encounter for general adult medical examination without abnormal findings: Secondary | ICD-10-CM | POA: Diagnosis not present

## 2023-02-07 NOTE — Progress Notes (Signed)
Subjective:   Brenda Obrien is a 57 y.o. female who presents for an Initial Medicare Annual Wellness Visit.  Visit Complete: Virtual I connected with  Brenda Obrien on 02/07/23 by a audio enabled telemedicine application and verified that I am speaking with the correct person using two identifiers.  Patient Location: Home  Provider Location: Office/Clinic  I discussed the limitations of evaluation and management by telemedicine. The patient expressed understanding and agreed to proceed.  Vital Signs: Because this visit was a virtual/telehealth visit, some criteria may be missing or patient reported. Any vitals not documented were not able to be obtained and vitals that have been documented are patient reported.    Cardiac Risk Factors include: diabetes mellitus;dyslipidemia;hypertension;smoking/ tobacco exposure     Objective:    Today's Vitals   02/07/23 1256  PainSc: 6    There is no height or weight on file to calculate BMI.     02/07/2023    1:05 PM 11/06/2020    8:52 AM 03/10/2018   11:40 AM 12/24/2017   10:47 AM 03/15/2016    5:00 PM 03/15/2016    1:45 AM 03/14/2016    5:27 PM  Advanced Directives  Does Patient Have a Medical Advance Directive? Yes Yes No No  No No  Type of Estate agent of [place];Living will Living will;Healthcare Power of Attorney       Copy of Healthcare Power of Attorney in Chart? Yes - validated most recent copy scanned in chart (See row information)        Would patient like information on creating a medical advance directive?   Yes (MAU/Ambulatory/Procedural Areas - Information given) No - Patient declined        Information is confidential and restricted. Go to Review Flowsheets to unlock data.    Current Medications (verified) Outpatient Encounter Medications as of 02/07/2023  Medication Sig   amLODipine (NORVASC) 5 MG tablet TAKE 1 TABLET (5 MG TOTAL) BY MOUTH DAILY.   Blood Glucose Monitoring Suppl (CONTOUR  MONITOR) w/Device KIT 1 Device by Does not apply route daily. Patient is to test once daily. Dx E11.9   cyanocobalamin (VITAMIN B12) 1000 MCG/ML injection Inject 1 ml into the muscle once a week for a month.  Then injected 1 ml once a month thereafter   diclofenac sodium (VOLTAREN) 1 % GEL Apply 3 gm to 3 large joints up to 3 times a day.Dispense 3 tubes with 3 refills.   Dulaglutide (TRULICITY) 0.75 MG/0.5ML SOPN INJECT 0.75 MG SUBCUTANEOUSLY ONE TIME PER WEEK   ezetimibe (ZETIA) 10 MG tablet TAKE 1 TABLET BY MOUTH EVERY DAY   glucose blood (CONTOUR NEXT TEST) test strip Use to check sugar daily Dx E11.9   lisinopril (ZESTRIL) 40 MG tablet Take 1 tablet (40 mg total) by mouth daily.   metFORMIN (GLUCOPHAGE) 1000 MG tablet TAKE 1 TABLET (1,000 MG TOTAL) BY MOUTH TWICE A DAY WITH FOOD   Microlet Lancets MISC USE AS DIRECTED EVERY DAY   nebivolol (BYSTOLIC) 5 MG tablet TAKE 1 TABLET (5 MG TOTAL) BY MOUTH DAILY.   omeprazole (PRILOSEC) 40 MG capsule TAKE 1 CAPSULE BY MOUTH EVERY DAY   scopolamine (TRANSDERM-SCOP) 1 MG/3DAYS Place 1 patch (1.5 mg total) onto the skin every 3 (three) days.   simvastatin (ZOCOR) 20 MG tablet TAKE 1 TABLET BY MOUTH DAILY AT 6 PM.   timolol (BETIMOL) 0.25 % ophthalmic solution 1-2 drops 2 (two) times daily.   cyanocobalamin 2000 MCG tablet Take  2,000 mcg by mouth daily. (Patient not taking: Reported on 02/07/2023)   No facility-administered encounter medications on file as of 02/07/2023.    Allergies (verified) Patient has no known allergies.   History: Past Medical History:  Diagnosis Date   Asthma    Cataract    Depression    Diabetes mellitus, type 2 (HCC) 02/2018   Elevated cholesterol    Endometriosis    Generalized anxiety disorder 2017   GERD (gastroesophageal reflux disease)    Glaucoma    HSV-1 infection 06/1998   Hypertension    PTSD (post-traumatic stress disorder) 2017   Past Surgical History:  Procedure Laterality Date   ANTERIOR CRUCIATE  LIGAMENT REPAIR  1987,2001,2003,2005   BACK SURGERY  01/2017   due herniated disc   CERVICAL FUSION  10/21/2018   EYE SURGERY Bilateral 2016   release pressure    LAPAROSCOPIC CHOLECYSTECTOMY  5/14   [place], [place]   LAPAROSCOPIC TOTAL HYSTERECTOMY  6/11   left knee surgery  09/2011   NASAL SINUS SURGERY  1992   REPLACEMENT TOTAL KNEE Left 9/14   Dr. Elliot Dally   SHOULDER SURGERY Right 09/08/2015   due to injury    SHOULDER SURGERY Right 03/20/2016   due to injury   SHOULDER SURGERY Left 09/10/2017   due rotator cuff   Family History  Problem Relation Age of Onset   Coronary artery disease Mother        PAD   Heart attack Mother 32   Diabetes Mother    Lung cancer Father    Heart attack Father 3   Hypertension Brother    High Cholesterol Brother    Social History   Socioeconomic History   Marital status: Single    Spouse name: Not on file   Number of children: Not on file   Years of education: Not on file   Highest education level: Not on file  Occupational History   Occupation: Event organiser: [employer]    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  Tobacco Use   Smoking status: Every Day    Current packs/day: 1.00    Types: Cigarettes   Smokeless tobacco: Never  Vaping Use   Vaping status: Never Used  Substance and Sexual Activity   Alcohol use: Yes    Alcohol/week: 14.0 - 21.0 standard drinks of alcohol    Types: 14 - 21 Cans of beer per week   Drug use: Never   Sexual activity: Not Currently    Birth control/protection: Surgical    Comment: hysterectomy  Other Topics Concern   Not on file  Social History Narrative   Single      No regular exercise      Works as Conservator, museum/gallery for [employer]         Social Determinants of Health   Financial Resource Strain: Low Risk  (02/07/2023)   Overall Financial Resource Strain (CARDIA)    Difficulty of Paying Living Expenses: Not hard at all  Food Insecurity: No Food Insecurity (02/07/2023)    Hunger Vital Sign    Worried About Running Out of Food in the Last Year: Never true    Ran Out of Food in the Last Year: Never true  Transportation Needs: No Transportation Needs (02/07/2023)   PRAPARE - Administrator, Civil Service (Medical): No    Lack of Transportation (Non-Medical): No  Physical Activity: Inactive (02/07/2023)   Exercise Vital Sign    Days of Exercise per  Week: 0 days    Minutes of Exercise per Session: 0 min  Stress: Stress Concern Present (02/07/2023)   [employer] of Occupational Health - Occupational Stress Questionnaire    Feeling of Stress : To some extent  Social Connections: Socially Isolated (02/07/2023)   Social Connection and Isolation Panel [NHANES]    Frequency of Communication with Friends and Family: Twice a week    Frequency of Social Gatherings with Friends and Family: Once a week    Attends Religious Services: Never    Database administrator or Organizations: No    Attends Engineer, structural: Never    Marital Status: Never married    Tobacco Counseling Ready to quit: Yes Counseling given: Not Answered   Clinical Intake:  Pre-visit preparation completed: Yes  Pain : 0-10 Pain Score: 6  Pain Type: Chronic pain Pain Location: Hip (knee and IT band) Pain Orientation: Left Pain Descriptors / Indicators: Aching Pain Onset: More than a month ago Pain Frequency: Constant     Nutritional Risks: None Diabetes: Yes CBG done?: No Did pt. bring in CBG monitor from home?: No  How often do you need to have someone help you when you read instructions, pamphlets, or other written materials from your doctor or pharmacy?: 1 - Never  Interpreter Needed?: No  Information entered by :: NAllen LPN   Activities of Daily Living    02/07/2023   12:58 PM  In your present state of health, do you have any difficulty performing the following activities:  Hearing? 0  Vision? 0  Difficulty concentrating or making  decisions? 0  Walking or climbing stairs? 1  Comment due to hip and knee  Dressing or bathing? 0  Doing errands, shopping? 0  Preparing Food and eating ? N  Using the Toilet? N  In the past six months, have you accidently leaked urine? Y  Comment nothing new  Do you have problems with loss of bowel control? N  Managing your Medications? N  Managing your Finances? N  Housekeeping or managing your Housekeeping? N    Patient Care Team: Philip Aspen, Limmie Patricia, MD as PCP - General (Internal Medicine) Jerene Bears, MD as Consulting Physician (Gynecology)  Indicate any recent Medical Services you may have received from other than [facility] providers in the past year (date may be approximate).     Assessment:   This is a routine wellness examination for Leomia.  Hearing/Vision screen Hearing Screening - Comments:: Denies hearing issues Vision Screening - Comments:: Regular eye exams, [facility], Dr. Fredrich Birks   Goals Addressed             This Visit's Progress    Patient Stated       02/07/2023, denies goals       Depression Screen    02/07/2023    1:07 PM 12/03/2022   11:02 AM 11/20/2022    8:12 AM 11/20/2022    7:53 AM 11/20/2022    7:52 AM 08/21/2022    7:12 AM 05/15/2022    7:09 AM  PHQ 2/9 Scores  PHQ - 2 Score 1 2 0   2   PHQ- 9 Score 4 6 0   4   Exception Documentation    Patient refusal Patient refusal  Patient refusal    Fall Risk    02/07/2023    1:06 PM 12/03/2022   10:27 AM 11/20/2022    8:12 AM 11/20/2022    7:52 AM 08/21/2022  7:12 AM  Fall Risk   Falls in the past year? 1 0 0 0 0  Comment fell getting out bed      Number falls in past yr: 0 0 0 0 0  Injury with Fall? 0 0 0 0 0  Risk for fall due to : Medication side effect;Impaired balance/gait      Follow up Falls prevention discussed;Falls evaluation completed Falls evaluation completed Falls evaluation completed Falls evaluation completed Falls evaluation completed    MEDICARE RISK AT  HOME: Medicare Risk at Home Any stairs in or around the home?: No If so, are there any without handrails?: No Home free of loose throw rugs in walkways, pet beds, electrical cords, etc?: Yes Adequate lighting in your home to reduce risk of falls?: Yes Life alert?: No Use of a cane, walker or w/c?: Yes Grab bars in the bathroom?: No Shower chair or bench in shower?: No Elevated toilet seat or a handicapped toilet?: Yes  TIMED UP AND GO:  Was the test performed? No    Cognitive Function:        02/07/2023    1:09 PM  6CIT Screen  What Year? 0 points  What month? 0 points  What time? 0 points  Count back from 20 0 points  Months in reverse 4 points  Repeat phrase 4 points  Total Score 8 points    Immunizations Immunization History  Administered Date(s) Administered   Influenza, Quadrivalent, Recombinant, Inj, Pf 01/25/2019   Influenza, Seasonal, Injecte, Preservative Fre 01/23/2023   Influenza,inj,Quad PF,6+ Mos 02/01/2015, 12/27/2015, 01/21/2017, 02/04/2018, 12/03/2019, 02/02/2021, 12/12/2021   Influenza-Unspecified 01/27/2014   PFIZER(Purple Top)SARS-COV-2 Vaccination 07/12/2019, 07/31/2019, 03/15/2020   Pneumococcal Polysaccharide-23 05/26/2018   Td 03/04/2007   Tdap 02/04/2018    TDAP status: Up to date  Flu Vaccine status: Up to date  Pneumococcal vaccine status: Up to date  Covid-19 vaccine status: Information provided on how to obtain vaccines.   Qualifies for Shingles Vaccine? Yes   Zostavax completed No   Shingrix Completed?: No.    Education has been provided regarding the importance of this vaccine. Patient has been advised to call insurance company to determine out of pocket expense if they have not yet received this vaccine. Advised may also receive vaccine at local pharmacy or Health Dept. Verbalized acceptance and understanding.  Screening Tests Health Maintenance  Topic Date Due   Zoster Vaccines- Shingrix (1 of 2) Never done   COVID-19  Vaccine (4 - 2023-24 season) 12/15/2022   Diabetic kidney evaluation - eGFR measurement  02/15/2023   Diabetic kidney evaluation - Urine ACR  02/15/2023   MAMMOGRAM  05/16/2023 (Originally 04/15/2022)   FOOT EXAM  05/16/2023   HEMOGLOBIN A1C  05/23/2023   OPHTHALMOLOGY EXAM  05/23/2023   Fecal DNA (Cologuard)  01/16/2024   Medicare Annual Wellness (AWV)  02/07/2024   DTaP/Tdap/Td (3 - Td or Tdap) 02/05/2028   INFLUENZA VACCINE  Completed   Hepatitis C Screening  Completed   HPV VACCINES  Aged Out   HIV Screening  Discontinued    Health Maintenance  Health Maintenance Due  Topic Date Due   Zoster Vaccines- Shingrix (1 of 2) Never done   COVID-19 Vaccine (4 - 2023-24 season) 12/15/2022   Diabetic kidney evaluation - eGFR measurement  02/15/2023   Diabetic kidney evaluation - Urine ACR  02/15/2023    Colorectal cancer screening: Type of screening: Cologuard. Completed 01/15/2021. Repeat every 3 years  Mammogram status: patient to schedule  Bone  Density status: n/a  Lung Cancer Screening: (Low Dose CT Chest recommended if Age 37-80 years, 20 pack-year currently smoking OR have quit w/in 15years.) does not qualify.   Lung Cancer Screening Referral: no  Additional Screening:  Hepatitis C Screening: does qualify; Completed 02/22/2022  Vision Screening: Recommended annual ophthalmology exams for early detection of glaucoma and other disorders of the eye. Is the patient up to date with their annual eye exam?  Yes  Who is the provider or what is the name of the office in which the patient attends annual eye exams? Dr. Lorin Picket If pt is not established with a provider, would they like to be referred to a provider to establish care? No .   Dental Screening: Recommended annual dental exams for proper oral hygiene  Diabetic Foot Exam: Diabetic Foot Exam: Completed 05/15/2022  Community Resource Referral / Chronic Care Management: CRR required this visit?  No   CCM required this visit?   No     Plan:     I have personally reviewed and noted the following in the patient's chart:   Medical and social history Use of alcohol, tobacco or illicit drugs  Current medications and supplements including opioid prescriptions. Patient is not currently taking opioid prescriptions. Functional ability and status Nutritional status Physical activity Advanced directives List of other physicians Hospitalizations, surgeries, and ER visits in previous 12 months Vitals Screenings to include cognitive, depression, and falls Referrals and appointments  In addition, I have reviewed and discussed with patient certain preventive protocols, quality metrics, and best practice recommendations. A written personalized care plan for preventive services as well as general preventive health recommendations were provided to patient.     Barb Merino, LPN   [id number]   After Visit Summary: (MyChart) Due to this being a telephonic visit, the after visit summary with patients personalized plan was offered to patient via MyChart   Nurse Notes: none

## 2023-02-07 NOTE — Patient Instructions (Signed)
Brenda Obrien , Thank you for taking time to come for your Medicare Wellness Visit. I appreciate your ongoing commitment to your health goals. Please review the following plan we discussed and let me know if I can assist you in the future.   Referrals/Orders/Follow-Ups/Clinician Recommendations: none  This is a list of the screening recommended for you and due dates:  Health Maintenance  Topic Date Due   Zoster (Shingles) Vaccine (1 of 2) Never done   COVID-19 Vaccine (4 - 2023-24 season) 12/15/2022   Yearly kidney function blood test for diabetes  02/15/2023   Yearly kidney health urinalysis for diabetes  02/15/2023   Mammogram  05/16/2023*   Complete foot exam   05/16/2023   Hemoglobin A1C  05/23/2023   Eye exam for diabetics  05/23/2023   Cologuard (Stool DNA test)  01/16/2024   Medicare Annual Wellness Visit  02/07/2024   DTaP/Tdap/Td vaccine (3 - Td or Tdap) 02/05/2028   Flu Shot  Completed   Hepatitis C Screening  Completed   HPV Vaccine  Aged Out   HIV Screening  Discontinued  *Topic was postponed. The date shown is not the original due date.    Advanced directives: (In Chart) A copy of your advanced directives are scanned into your chart should your provider ever need it.  Next Medicare Annual Wellness Visit scheduled for next year: Yes  insert Preventive Care Attachment Reference

## 2023-02-09 ENCOUNTER — Other Ambulatory Visit: Payer: Self-pay | Admitting: Internal Medicine

## 2023-02-09 DIAGNOSIS — E1165 Type 2 diabetes mellitus with hyperglycemia: Secondary | ICD-10-CM

## 2023-02-09 DIAGNOSIS — K219 Gastro-esophageal reflux disease without esophagitis: Secondary | ICD-10-CM

## 2023-02-19 ENCOUNTER — Encounter: Payer: Self-pay | Admitting: Internal Medicine

## 2023-02-19 ENCOUNTER — Ambulatory Visit (INDEPENDENT_AMBULATORY_CARE_PROVIDER_SITE_OTHER): Payer: Medicare Other | Admitting: Internal Medicine

## 2023-02-19 VITALS — BP 160/85 | HR 88 | Temp 98.9°F | Wt 237.2 lb

## 2023-02-19 DIAGNOSIS — E782 Mixed hyperlipidemia: Secondary | ICD-10-CM | POA: Diagnosis not present

## 2023-02-19 DIAGNOSIS — I10 Essential (primary) hypertension: Secondary | ICD-10-CM

## 2023-02-19 DIAGNOSIS — E1169 Type 2 diabetes mellitus with other specified complication: Secondary | ICD-10-CM | POA: Diagnosis not present

## 2023-02-19 LAB — POCT GLYCOSYLATED HEMOGLOBIN (HGB A1C): Hemoglobin A1C: 6.7 % — AB (ref 4.0–5.6)

## 2023-02-19 NOTE — Assessment & Plan Note (Signed)
On simvastatin and ezetimibe.  Recheck lipids next visit.

## 2023-02-19 NOTE — Assessment & Plan Note (Signed)
Well-controlled today with an A1c of 6.7.

## 2023-02-19 NOTE — Progress Notes (Signed)
Established Patient Office Visit     CC/Reason for Visit: Follow-up chronic conditions  HPI: Brenda Obrien is a 57 y.o. female who is coming in today for the above mentioned reasons. Past Medical History is significant for: Hypertension, hyperlipidemia, type 2 diabetes, obesity.  Feeling well without major concerns or complaints.  Ambulatory blood pressure measurements: 136/90, 138/89, 135/86, 140/91.   Past Medical/Surgical History: Past Medical History:  Diagnosis Date   Asthma    Cataract    Depression    Diabetes mellitus, type 2 (HCC) 02/2018   Elevated cholesterol    Endometriosis    Generalized anxiety disorder 2017   GERD (gastroesophageal reflux disease)    Glaucoma    HSV-1 infection 06/1998   Hypertension    PTSD (post-traumatic stress disorder) 2017    Past Surgical History:  Procedure Laterality Date   ANTERIOR CRUCIATE LIGAMENT REPAIR  1987,2001,2003,2005   BACK SURGERY  01/2017   due herniated disc   CERVICAL FUSION  10/21/2018   EYE SURGERY Bilateral 2016   release pressure    LAPAROSCOPIC CHOLECYSTECTOMY  5/14   [place], [place]   LAPAROSCOPIC TOTAL HYSTERECTOMY  6/11   left knee surgery  09/2011   NASAL SINUS SURGERY  1992   REPLACEMENT TOTAL KNEE Left 9/14   Dr. Elliot Dally   SHOULDER SURGERY Right 09/08/2015   due to injury    SHOULDER SURGERY Right 03/20/2016   due to injury   SHOULDER SURGERY Left 09/10/2017   due rotator cuff    Social History:  reports that she has been smoking cigarettes. She has never used smokeless tobacco. She reports current alcohol use of about 14.0 - 21.0 standard drinks of alcohol per week. She reports that she does not use drugs.  Allergies: No Known Allergies  Family History:  Family History  Problem Relation Age of Onset   Coronary artery disease Mother        PAD   Heart attack Mother 48   Diabetes Mother    Lung cancer Father    Heart attack Father 85   Hypertension Brother    High Cholesterol  Brother      Current Outpatient Medications:    amLODipine (NORVASC) 5 MG tablet, TAKE 1 TABLET (5 MG TOTAL) BY MOUTH DAILY., Disp: 90 tablet, Rfl: 1   Blood Glucose Monitoring Suppl (CONTOUR MONITOR) w/Device KIT, 1 Device by Does not apply route daily. Patient is to test once daily. Dx E11.9, Disp: 1 kit, Rfl: 1   cyanocobalamin (VITAMIN B12) 1000 MCG/ML injection, Inject 1 ml into the muscle once a week for a month.  Then injected 1 ml once a month thereafter, Disp: 6 mL, Rfl: 11   diclofenac sodium (VOLTAREN) 1 % GEL, Apply 3 gm to 3 large joints up to 3 times a day.Dispense 3 tubes with 3 refills., Disp: 5 Tube, Rfl: 0   Dulaglutide (TRULICITY) 0.75 MG/0.5ML SOPN, INJECT 0.75 MG SUBCUTANEOUSLY ONE TIME PER WEEK, Disp: 6 mL, Rfl: 8   ezetimibe (ZETIA) 10 MG tablet, TAKE 1 TABLET BY MOUTH EVERY DAY, Disp: 90 tablet, Rfl: 1   glucose blood (CONTOUR NEXT TEST) test strip, Use to check sugar daily Dx E11.9, Disp: 100 strip, Rfl: 3   lisinopril (ZESTRIL) 40 MG tablet, Take 1 tablet (40 mg total) by mouth daily., Disp: 90 tablet, Rfl: 1   metFORMIN (GLUCOPHAGE) 1000 MG tablet, TAKE 1 TABLET (1,000 MG TOTAL) BY MOUTH TWICE A DAY WITH FOOD, Disp: 180 tablet,  Rfl: 1   Microlet Lancets MISC, USE AS DIRECTED EVERY DAY, Disp: 100 each, Rfl: 11   nebivolol (BYSTOLIC) 5 MG tablet, TAKE 1 TABLET (5 MG TOTAL) BY MOUTH DAILY., Disp: 90 tablet, Rfl: 1   omeprazole (PRILOSEC) 40 MG capsule, TAKE 1 CAPSULE BY MOUTH EVERY DAY, Disp: 90 capsule, Rfl: 1   scopolamine (TRANSDERM-SCOP) 1 MG/3DAYS, Place 1 patch (1.5 mg total) onto the skin every 3 (three) days., Disp: 10 patch, Rfl: 0   simvastatin (ZOCOR) 20 MG tablet, TAKE 1 TABLET BY MOUTH DAILY AT 6 PM., Disp: 90 tablet, Rfl: 1   timolol (BETIMOL) 0.25 % ophthalmic solution, 1-2 drops 2 (two) times daily., Disp: , Rfl:   Review of Systems:  Negative unless indicated in HPI.   Physical Exam: Vitals:   02/19/23 0704 02/19/23 0708  BP: (!) 140/90 (!) 160/85   Pulse: 88   Temp: 98.9 F (37.2 C)   TempSrc: Oral   SpO2: 97%   Weight: 237 lb 3.2 oz (107.6 kg)     Body mass index is 38.29 kg/m.   Physical Exam   Impression and Plan:  Type 2 diabetes mellitus with other specified complication, without long-term current use of insulin (HCC) Assessment & Plan: Well-controlled today with an A1c of 6.7.  Orders: -     POCT glycosylated hemoglobin (Hb A1C)  Primary hypertension Assessment & Plan: Remains uncontrolled.  She seems hesitant to start additional antihypertensive treatment today.  She has asked to discuss this again at next appointment in 3 months.   Mixed hyperlipidemia Assessment & Plan: On simvastatin and ezetimibe.  Recheck lipids next visit.      Time spent:32 minutes reviewing chart, interviewing and examining patient and formulating plan of care.     Chaya Jan, MD [facility]

## 2023-02-19 NOTE — Assessment & Plan Note (Signed)
Remains uncontrolled.  She seems hesitant to start additional antihypertensive treatment today.  She has asked to discuss this again at next appointment in 3 months.

## 2023-02-24 ENCOUNTER — Ambulatory Visit (INDEPENDENT_AMBULATORY_CARE_PROVIDER_SITE_OTHER): Payer: Medicare Other | Admitting: *Deleted

## 2023-02-24 DIAGNOSIS — E538 Deficiency of other specified B group vitamins: Secondary | ICD-10-CM

## 2023-02-24 MED ORDER — CYANOCOBALAMIN 1000 MCG/ML IJ SOLN
1000.0000 ug | Freq: Once | INTRAMUSCULAR | Status: AC
Start: 2023-02-24 — End: 2023-02-24
  Administered 2023-02-24: 1000 ug via INTRAMUSCULAR

## 2023-02-24 NOTE — Progress Notes (Signed)
Per orders of Dr. Hernandez, injection of B12 given by Dailyn Reith. Patient tolerated injection well.  

## 2023-03-27 ENCOUNTER — Ambulatory Visit: Payer: Medicare Other

## 2023-03-27 DIAGNOSIS — E538 Deficiency of other specified B group vitamins: Secondary | ICD-10-CM | POA: Diagnosis not present

## 2023-03-27 MED ORDER — CYANOCOBALAMIN 1000 MCG/ML IJ SOLN
1000.0000 ug | Freq: Once | INTRAMUSCULAR | Status: AC
Start: 2023-03-27 — End: 2023-03-27
  Administered 2023-03-27: 1000 ug via INTRAMUSCULAR

## 2023-03-27 NOTE — Progress Notes (Signed)
Per orders of Dr. Hernandez, injection of Cyanocobalamin 1000 mcg given by Kazden Largo L Maliya Marich. Patient tolerated injection well.  

## 2023-04-12 ENCOUNTER — Other Ambulatory Visit: Payer: Self-pay | Admitting: Internal Medicine

## 2023-04-27 ENCOUNTER — Other Ambulatory Visit: Payer: Self-pay | Admitting: Internal Medicine

## 2023-04-27 DIAGNOSIS — E785 Hyperlipidemia, unspecified: Secondary | ICD-10-CM

## 2023-04-28 ENCOUNTER — Ambulatory Visit (INDEPENDENT_AMBULATORY_CARE_PROVIDER_SITE_OTHER): Payer: Medicare Other

## 2023-04-28 DIAGNOSIS — E538 Deficiency of other specified B group vitamins: Secondary | ICD-10-CM | POA: Diagnosis not present

## 2023-04-28 MED ORDER — CYANOCOBALAMIN 1000 MCG/ML IJ SOLN
1000.0000 ug | Freq: Once | INTRAMUSCULAR | Status: AC
  Administered 2023-04-28: 1000 ug via INTRAMUSCULAR

## 2023-04-28 NOTE — Progress Notes (Signed)
 Per orders of Dr. Ardyth Harps, injection of B12 given by Vickii Chafe on Left Deltoid. Patient tolerated injection well.

## 2023-05-07 ENCOUNTER — Ambulatory Visit: Payer: Medicare Other | Admitting: Internal Medicine

## 2023-05-08 ENCOUNTER — Encounter: Payer: Self-pay | Admitting: Internal Medicine

## 2023-05-08 ENCOUNTER — Ambulatory Visit (INDEPENDENT_AMBULATORY_CARE_PROVIDER_SITE_OTHER): Payer: Self-pay | Admitting: Internal Medicine

## 2023-05-08 VITALS — BP 140/94 | HR 82 | Temp 98.3°F | Wt 233.4 lb

## 2023-05-08 DIAGNOSIS — R200 Anesthesia of skin: Secondary | ICD-10-CM | POA: Diagnosis not present

## 2023-05-08 DIAGNOSIS — M542 Cervicalgia: Secondary | ICD-10-CM

## 2023-05-08 DIAGNOSIS — E1169 Type 2 diabetes mellitus with other specified complication: Secondary | ICD-10-CM

## 2023-05-08 DIAGNOSIS — R202 Paresthesia of skin: Secondary | ICD-10-CM

## 2023-05-08 DIAGNOSIS — Z7984 Long term (current) use of oral hypoglycemic drugs: Secondary | ICD-10-CM
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
## 2023-05-09 ENCOUNTER — Encounter: Payer: Self-pay | Admitting: Internal Medicine

## 2023-05-10 ENCOUNTER — Emergency Department (HOSPITAL_BASED_OUTPATIENT_CLINIC_OR_DEPARTMENT_OTHER)
Admission: EM | Admit: 2023-05-10 | Discharge: 2023-05-10 | Disposition: A | Discharge status: Home / Self Care | Payer: No Typology Code available for payment source | Attending: Emergency Medicine | Admitting: Emergency Medicine

## 2023-05-10 ENCOUNTER — Emergency Department (HOSPITAL_BASED_OUTPATIENT_CLINIC_OR_DEPARTMENT_OTHER): Payer: No Typology Code available for payment source

## 2023-05-10 ENCOUNTER — Other Ambulatory Visit: Payer: Self-pay

## 2023-05-10 DIAGNOSIS — Z794 Long term (current) use of insulin: Secondary | ICD-10-CM | POA: Diagnosis not present

## 2023-05-10 DIAGNOSIS — R519 Headache, unspecified: Secondary | ICD-10-CM | POA: Insufficient documentation

## 2023-05-10 DIAGNOSIS — R202 Paresthesia of skin: Secondary | ICD-10-CM | POA: Insufficient documentation

## 2023-05-10 DIAGNOSIS — M542 Cervicalgia: Secondary | ICD-10-CM | POA: Diagnosis not present

## 2023-05-10 MED ORDER — HYDROCODONE-ACETAMINOPHEN 5-325 MG PO TABS: 1.0000 | ORAL_TABLET | ORAL | 0 refills | Status: DC | PRN

## 2023-05-10 MED ORDER — METHOCARBAMOL 500 MG PO TABS: 500.0000 mg | ORAL_TABLET | Freq: Four times a day (QID) | ORAL | 0 refills | Status: AC
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
## 2023-05-10 NOTE — Discharge Instructions (Addendum)
Return if any problems.

## 2023-05-14 ENCOUNTER — Other Ambulatory Visit: Payer: Self-pay | Admitting: Internal Medicine

## 2023-05-14 DIAGNOSIS — E782 Mixed hyperlipidemia: Secondary | ICD-10-CM

## 2023-05-27 LAB — HM DIABETES EYE EXAM

## 2023-05-28 ENCOUNTER — Encounter: Payer: Self-pay | Admitting: Internal Medicine

## 2023-05-28 ENCOUNTER — Other Ambulatory Visit: Payer: Self-pay | Admitting: Internal Medicine

## 2023-05-28 DIAGNOSIS — I10 Essential (primary) hypertension: Secondary | ICD-10-CM

## 2023-06-02 ENCOUNTER — Telehealth: Payer: Self-pay | Admitting: Internal Medicine

## 2023-06-02 NOTE — Telephone Encounter (Signed)
Pt would like an earlier AWV before October.  She can't have a physical since she has Medicare A/B.  Copied from CRM [id number]. Topic: General - Other >> Jun 02, 2023  8:18 AM [place] A wrote: Reason for CRM: Patient called to reschedule Physical for next week due to weather. Patient would like to know if October is too long to wait for Physical since that was the next available time-please contact patient she is not sure if Dr. Loreta Ave would want her to wait until October

## 2023-06-03 ENCOUNTER — Ambulatory Visit: Payer: Medicare Other | Admitting: Internal Medicine

## 2023-06-04 ENCOUNTER — Encounter: Payer: Medicare Other | Admitting: Internal Medicine

## 2023-06-09 ENCOUNTER — Ambulatory Visit (INDEPENDENT_AMBULATORY_CARE_PROVIDER_SITE_OTHER): Payer: Medicare Other | Admitting: *Deleted

## 2023-06-09 DIAGNOSIS — E538 Deficiency of other specified B group vitamins: Secondary | ICD-10-CM

## 2023-06-09 MED ORDER — CYANOCOBALAMIN 1000 MCG/ML IJ SOLN
1000.0000 ug | Freq: Once | INTRAMUSCULAR | Status: AC
  Administered 2023-06-09: 1000 ug via INTRAMUSCULAR

## 2023-06-09 NOTE — Progress Notes (Signed)
 Per orders of Dr. Ardyth Harps, injection of B12 given by Kern Reap. Patient tolerated injection well.

## 2023-06-12 ENCOUNTER — Ambulatory Visit (INDEPENDENT_AMBULATORY_CARE_PROVIDER_SITE_OTHER): Payer: Medicare Other | Admitting: Internal Medicine

## 2023-06-12 ENCOUNTER — Encounter: Payer: Self-pay | Admitting: Internal Medicine

## 2023-06-12 VITALS — BP 150/80 | HR 85 | Temp 98.7°F | Ht 66.5 in | Wt 229.7 lb

## 2023-06-12 DIAGNOSIS — E782 Mixed hyperlipidemia: Secondary | ICD-10-CM | POA: Diagnosis not present

## 2023-06-12 DIAGNOSIS — E1169 Type 2 diabetes mellitus with other specified complication: Secondary | ICD-10-CM | POA: Diagnosis not present

## 2023-06-12 DIAGNOSIS — E538 Deficiency of other specified B group vitamins: Secondary | ICD-10-CM | POA: Diagnosis not present

## 2023-06-12 DIAGNOSIS — I10 Essential (primary) hypertension: Secondary | ICD-10-CM | POA: Diagnosis not present

## 2023-06-12 DIAGNOSIS — E785 Hyperlipidemia, unspecified: Secondary | ICD-10-CM

## 2023-06-12 DIAGNOSIS — Z6836 Body mass index (BMI) 36.0-36.9, adult: Secondary | ICD-10-CM

## 2023-06-12 DIAGNOSIS — Z7984 Long term (current) use of oral hypoglycemic drugs: Secondary | ICD-10-CM

## 2023-06-12 DIAGNOSIS — Z7985 Long-term (current) use of injectable non-insulin antidiabetic drugs: Secondary | ICD-10-CM

## 2023-06-12 LAB — VITAMIN B12: Vitamin B-12: 600 pg/mL (ref 211–911)

## 2023-06-12 LAB — MICROALBUMIN / CREATININE URINE RATIO
Creatinine,U: 87 mg/dL
Microalb Creat Ratio: 8.8 mg/g (ref 0.0–30.0)
Microalb, Ur: 0.8 mg/dL (ref 0.0–1.9)

## 2023-06-12 LAB — CBC WITH DIFFERENTIAL/PLATELET
Basophils Absolute: 0.1 10*3/uL (ref 0.0–0.1)
Basophils Relative: 1 % (ref 0.0–3.0)
Eosinophils Absolute: 0.2 10*3/uL (ref 0.0–0.7)
Eosinophils Relative: 2.2 % (ref 0.0–5.0)
HCT: 45.8 % (ref 36.0–46.0)
Hemoglobin: 15.6 g/dL — ABNORMAL HIGH (ref 12.0–15.0)
Lymphocytes Relative: 24.6 % (ref 12.0–46.0)
Lymphs Abs: 2.1 10*3/uL (ref 0.7–4.0)
MCHC: 34 g/dL (ref 30.0–36.0)
MCV: 94.1 fL (ref 78.0–100.0)
Monocytes Absolute: 0.6 10*3/uL (ref 0.1–1.0)
Monocytes Relative: 7.2 % (ref 3.0–12.0)
Neutro Abs: 5.5 10*3/uL (ref 1.4–7.7)
Neutrophils Relative %: 65 % (ref 43.0–77.0)
Platelets: 226 10*3/uL (ref 150.0–400.0)
RBC: 4.87 Mil/uL (ref 3.87–5.11)
RDW: 13.6 % (ref 11.5–15.5)
WBC: 8.4 10*3/uL (ref 4.0–10.5)

## 2023-06-12 LAB — COMPREHENSIVE METABOLIC PANEL
ALT: 46 U/L — ABNORMAL HIGH (ref 0–35)
AST: 28 U/L (ref 0–37)
Albumin: 4.6 g/dL (ref 3.5–5.2)
Alkaline Phosphatase: 91 U/L (ref 39–117)
BUN: 7 mg/dL (ref 6–23)
CO2: 28 meq/L (ref 19–32)
Calcium: 9.8 mg/dL (ref 8.4–10.5)
Chloride: 100 meq/L (ref 96–112)
Creatinine, Ser: 0.78 mg/dL (ref 0.40–1.20)
GFR: 85.02 mL/min (ref >60.00)
Glucose, Bld: 118 mg/dL — ABNORMAL HIGH (ref 70–99)
Potassium: 4.7 meq/L (ref 3.5–5.1)
Sodium: 138 meq/L (ref 135–145)
Total Bilirubin: 0.7 mg/dL (ref 0.2–1.2)
Total Protein: 7.6 g/dL (ref 6.0–8.3)

## 2023-06-12 LAB — LIPID PANEL
Cholesterol: 143 mg/dL (ref 0–200)
HDL: 58.6 mg/dL (ref >39.00)
LDL Cholesterol: 67 mg/dL (ref 0–99)
NonHDL: 84.25
Total CHOL/HDL Ratio: 2
Triglycerides: 85 mg/dL (ref 0.0–149.0)
VLDL: 17 mg/dL (ref 0.0–40.0)

## 2023-06-12 LAB — HEMOGLOBIN A1C: Hgb A1c MFr Bld: 6.5 % (ref 4.6–6.5)

## 2023-06-12 MED ORDER — VALSARTAN-HYDROCHLOROTHIAZIDE 160-25 MG PO TABS: 1.0000 | ORAL_TABLET | Freq: Every day | ORAL | 1 refills | Status: DC

## 2023-06-12 NOTE — Progress Notes (Signed)
 Established Patient Office Visit     CC/Reason for Visit: Follow-up chronic medical conditions  HPI: Brenda Obrien is a 58 y.o. female who is coming in today for the above mentioned reasons. Past Medical History is significant for: Hypertension, hyperlipidemia, type 2 diabetes, morbid obesity.  She has been doing ambulatory blood pressure measurements with results as below:  150/98 143/89 150/99 163/101   Past Medical/Surgical History: Past Medical History:  Diagnosis Date   Asthma    Cataract    Depression    Diabetes mellitus, type 2 (HCC) 02/2018   Elevated cholesterol    Endometriosis    Generalized anxiety disorder 2017   GERD (gastroesophageal reflux disease)    Glaucoma    HSV-1 infection 06/1998   Hypertension    PTSD (post-traumatic stress disorder) 2017    Past Surgical History:  Procedure Laterality Date   ANTERIOR CRUCIATE LIGAMENT REPAIR  1987,2001,2003,2005   BACK SURGERY  01/2017   due herniated disc   CERVICAL FUSION  10/21/2018   EYE SURGERY Bilateral 2016   release pressure    LAPAROSCOPIC CHOLECYSTECTOMY  5/14   [place], [place]   LAPAROSCOPIC TOTAL HYSTERECTOMY  6/11   left knee surgery  09/2011   NASAL SINUS SURGERY  1992   REPLACEMENT TOTAL KNEE Left 9/14   Dr. Elliot Dally   SHOULDER SURGERY Right 09/08/2015   due to injury    SHOULDER SURGERY Right 03/20/2016   due to injury   SHOULDER SURGERY Left 09/10/2017   due rotator cuff    Social History:  reports that she has been smoking cigarettes. She has never used smokeless tobacco. She reports current alcohol use of about 14.0 - 21.0 standard drinks of alcohol per week. She reports that she does not use drugs.  Allergies: No Known Allergies  Family History:  Family History  Problem Relation Age of Onset   Coronary artery disease Mother        PAD   Heart attack Mother 14   Diabetes Mother    Lung cancer Father    Heart attack Father 25   Hypertension Brother    High  Cholesterol Brother      Current Outpatient Medications:    amLODipine (NORVASC) 5 MG tablet, TAKE 1 TABLET (5 MG TOTAL) BY MOUTH DAILY., Disp: 90 tablet, Rfl: 1   Blood Glucose Monitoring Suppl (CONTOUR MONITOR) w/Device KIT, 1 Device by Does not apply route daily. Patient is to test once daily. Dx E11.9, Disp: 1 kit, Rfl: 1   cyanocobalamin (VITAMIN B12) 1000 MCG/ML injection, Inject 1 ml into the muscle once a week for a month.  Then injected 1 ml once a month thereafter, Disp: 6 mL, Rfl: 11   diclofenac sodium (VOLTAREN) 1 % GEL, Apply 3 gm to 3 large joints up to 3 times a day.Dispense 3 tubes with 3 refills., Disp: 5 Tube, Rfl: 0   Dulaglutide (TRULICITY) 0.75 MG/0.5ML SOPN, INJECT 0.75 MG SUBCUTANEOUSLY ONE TIME PER WEEK, Disp: 6 mL, Rfl: 8   ezetimibe (ZETIA) 10 MG tablet, TAKE 1 TABLET BY MOUTH EVERY DAY, Disp: 90 tablet, Rfl: 1   glucose blood (CONTOUR NEXT TEST) test strip, Use to check sugar daily Dx E11.9, Disp: 100 strip, Rfl: 3   HYDROcodone-acetaminophen (NORCO/VICODIN) 5-325 MG tablet, Take 1 tablet by mouth every 4 (four) hours as needed for moderate pain (pain score 4-6)., Disp: 10 tablet, Rfl: 0   metFORMIN (GLUCOPHAGE) 1000 MG tablet, TAKE 1 TABLET (1,000 MG  TOTAL) BY MOUTH TWICE A DAY WITH FOOD, Disp: 180 tablet, Rfl: 1   methocarbamol (ROBAXIN) 500 MG tablet, Take 1 tablet (500 mg total) by mouth 4 (four) times daily., Disp: 20 tablet, Rfl: 0   Microlet Lancets MISC, USE AS DIRECTED EVERY DAY, Disp: 100 each, Rfl: 11   nebivolol (BYSTOLIC) 5 MG tablet, TAKE 1 TABLET (5 MG TOTAL) BY MOUTH DAILY., Disp: 90 tablet, Rfl: 1   omeprazole (PRILOSEC) 40 MG capsule, TAKE 1 CAPSULE BY MOUTH EVERY DAY, Disp: 90 capsule, Rfl: 1   scopolamine (TRANSDERM-SCOP) 1 MG/3DAYS, Place 1 patch (1.5 mg total) onto the skin every 3 (three) days., Disp: 10 patch, Rfl: 0   simvastatin (ZOCOR) 20 MG tablet, TAKE 1 TABLET BY MOUTH DAILY AT 6 PM., Disp: 90 tablet, Rfl: 1   timolol (BETIMOL) 0.25 %  ophthalmic solution, 1-2 drops 2 (two) times daily., Disp: , Rfl:    valsartan-hydrochlorothiazide (DIOVAN-HCT) 160-25 MG tablet, Take 1 tablet by mouth daily., Disp: 90 tablet, Rfl: 1  Review of Systems:  Negative unless indicated in HPI.   Physical Exam: Vitals:   06/12/23 0703 06/12/23 0708  BP: (!) 150/80 (!) 150/80  Pulse: 85   Temp: 98.7 F (37.1 C)   TempSrc: Oral   SpO2: 98%   Weight: 229 lb 11.2 oz (104.2 kg)   Height: 5' 6.5" (1.689 m)     Body mass index is 36.52 kg/m.   Physical Exam Vitals reviewed.  Constitutional:      Appearance: Normal appearance. She is obese.  HENT:     Head: Normocephalic and atraumatic.  Eyes:     Conjunctiva/sclera: Conjunctivae normal.  Cardiovascular:     Rate and Rhythm: Normal rate and regular rhythm.  Pulmonary:     Effort: Pulmonary effort is normal.     Breath sounds: Normal breath sounds.  Skin:    General: Skin is warm and dry.  Neurological:     General: No focal deficit present.     Mental Status: She is alert and oriented to person, place, and time.  Psychiatric:        Mood and Affect: Mood normal.        Behavior: Behavior normal.        Thought Content: Thought content normal.        Judgment: Judgment normal.      Impression and Plan:  Vitamin B12 deficiency  Type 2 diabetes mellitus with other specified complication, without long-term current use of insulin (HCC) -     Hemoglobin A1c; Future -     CBC with Differential/Platelet; Future -     Comprehensive metabolic panel; Future -     Microalbumin / creatinine urine ratio; Future  Primary hypertension -     Valsartan-hydroCHLOROthiazide; Take 1 tablet by mouth daily.  Dispense: 90 tablet; Refill: 1  Mixed hyperlipidemia -     Lipid panel; Future  B12 deficiency -     Vitamin B12; Future  Dyslipidemia  Morbid obesity (HCC)   -Blood pressure is not well-controlled.  Continue amlodipine 5 mg and nebivolol 5 mg.  Discontinue lisinopril 40 mg  and substitute for Diovan HCT 160/25 mg.  Continue ambulatory blood pressure measurements and return in 3 months for follow-up. -Check labs today including lipids to follow cholesterol levels and A1c to follow diabetes. -She is overdue for screening mammogram, she prefers to call to schedule. -She is due for pneumonia and shingles vaccinations but elects to defer at this time.  Time  spent:32 minutes reviewing chart, interviewing and examining patient and formulating plan of care.     Chaya Jan, MD [facility] at [facility]

## 2023-06-18 ENCOUNTER — Encounter: Payer: Self-pay | Admitting: Internal Medicine

## 2023-07-07 ENCOUNTER — Ambulatory Visit (INDEPENDENT_AMBULATORY_CARE_PROVIDER_SITE_OTHER): Payer: Medicare Other

## 2023-07-07 DIAGNOSIS — E538 Deficiency of other specified B group vitamins: Secondary | ICD-10-CM

## 2023-07-07 MED ORDER — CYANOCOBALAMIN 1000 MCG/ML IJ SOLN
1000.0000 ug | Freq: Once | INTRAMUSCULAR | Status: AC
  Administered 2023-07-07: 1000 ug via INTRAMUSCULAR

## 2023-07-07 NOTE — Progress Notes (Signed)
 Per orders of Dr. Ardyth Harps, injection of Cyanocobalamin injection 1000 mcg given by Vickii Chafe on Left Deltoid. Patient tolerated injection well.

## 2023-07-21 ENCOUNTER — Other Ambulatory Visit: Payer: Self-pay | Admitting: Internal Medicine

## 2023-07-21 DIAGNOSIS — E785 Hyperlipidemia, unspecified: Secondary | ICD-10-CM

## 2023-08-06 ENCOUNTER — Ambulatory Visit (INDEPENDENT_AMBULATORY_CARE_PROVIDER_SITE_OTHER)

## 2023-08-06 DIAGNOSIS — E538 Deficiency of other specified B group vitamins: Secondary | ICD-10-CM

## 2023-08-06 MED ORDER — CYANOCOBALAMIN 1000 MCG/ML IJ SOLN
1000.0000 ug | Freq: Once | INTRAMUSCULAR | Status: AC
  Administered 2023-08-06: 1000 ug via INTRAMUSCULAR

## 2023-08-06 NOTE — Progress Notes (Signed)
 Patient is in office today for a nurse visit for B12 Injection. Patient Injection was given in the  Left deltoid. Patient tolerated injection well.

## 2023-08-18 ENCOUNTER — Other Ambulatory Visit: Payer: Self-pay | Admitting: Internal Medicine

## 2023-08-18 DIAGNOSIS — E1165 Type 2 diabetes mellitus with hyperglycemia: Secondary | ICD-10-CM

## 2023-08-18 DIAGNOSIS — K219 Gastro-esophageal reflux disease without esophagitis: Secondary | ICD-10-CM

## 2023-08-19 ENCOUNTER — Other Ambulatory Visit: Payer: Self-pay | Admitting: Internal Medicine

## 2023-09-05 ENCOUNTER — Ambulatory Visit (INDEPENDENT_AMBULATORY_CARE_PROVIDER_SITE_OTHER)

## 2023-09-05 DIAGNOSIS — E538 Deficiency of other specified B group vitamins: Secondary | ICD-10-CM

## 2023-09-05 MED ORDER — CYANOCOBALAMIN 1000 MCG/ML IJ SOLN
1000.0000 ug | Freq: Once | INTRAMUSCULAR | Status: AC
  Administered 2023-09-05: 1000 ug via INTRAMUSCULAR

## 2023-09-05 NOTE — Progress Notes (Signed)
 Patient is in office today for a nurse visit for B12 Injection. Patient Injection was given in the  Right deltoid. Patient tolerated injection well.

## 2023-09-17 ENCOUNTER — Ambulatory Visit: Payer: Self-pay | Admitting: Internal Medicine

## 2023-09-17 ENCOUNTER — Encounter: Payer: Self-pay | Admitting: Internal Medicine

## 2023-09-17 ENCOUNTER — Telehealth: Payer: Self-pay

## 2023-09-17 ENCOUNTER — Ambulatory Visit (INDEPENDENT_AMBULATORY_CARE_PROVIDER_SITE_OTHER): Payer: Medicare Other | Admitting: Internal Medicine

## 2023-09-17 VITALS — BP 130/78 | HR 85 | Temp 98.5°F | Wt 230.3 lb

## 2023-09-17 DIAGNOSIS — E785 Hyperlipidemia, unspecified: Secondary | ICD-10-CM

## 2023-09-17 DIAGNOSIS — I10 Essential (primary) hypertension: Secondary | ICD-10-CM

## 2023-09-17 DIAGNOSIS — E1169 Type 2 diabetes mellitus with other specified complication: Secondary | ICD-10-CM | POA: Diagnosis not present

## 2023-09-17 DIAGNOSIS — Z7985 Long-term (current) use of injectable non-insulin antidiabetic drugs: Secondary | ICD-10-CM

## 2023-09-17 DIAGNOSIS — Z7984 Long term (current) use of oral hypoglycemic drugs: Secondary | ICD-10-CM

## 2023-09-17 DIAGNOSIS — E871 Hypo-osmolality and hyponatremia: Secondary | ICD-10-CM

## 2023-09-17 LAB — BASIC METABOLIC PANEL WITH GFR
BUN: 8 mg/dL (ref 6–23)
CO2: 26 meq/L (ref 19–32)
Calcium: 9.7 mg/dL (ref 8.4–10.5)
Chloride: 88 meq/L — ABNORMAL LOW (ref 96–112)
Creatinine, Ser: 0.8 mg/dL (ref 0.40–1.20)
GFR: 82.33 mL/min (ref >60.00)
Glucose, Bld: 133 mg/dL — ABNORMAL HIGH (ref 70–99)
Potassium: 4 meq/L (ref 3.5–5.1)
Sodium: 125 meq/L — ABNORMAL LOW (ref 135–145)

## 2023-09-17 LAB — POCT GLYCOSYLATED HEMOGLOBIN (HGB A1C): Hemoglobin A1C: 6.4 % — AB (ref 4.0–5.6)

## 2023-09-17 NOTE — Telephone Encounter (Signed)
 Copied from CRM [id number]. Topic: Clinical - Lab/Test Results >> Sep 17, 2023  3:48 PM Brenda Obrien wrote: Reason for CRM: Patient called in to return missed call from [facility] CMA regarding lab results, Please call [phone]

## 2023-09-17 NOTE — Assessment & Plan Note (Signed)
 Well-controlled today with an A1c of 6.4.

## 2023-09-17 NOTE — Assessment & Plan Note (Signed)
 Now well-controlled on amlodipine , Nebivolol  and Diovan  HCT.  Check renal function and electrolytes today.

## 2023-09-17 NOTE — Progress Notes (Signed)
 Established Patient Office Visit     CC/Reason for Visit: Follow-up chronic medical conditions  HPI: Brenda Obrien is a 58 y.o. female who is coming in today for the above mentioned reasons. Past Medical History is significant for: Hypertension, hyperlipidemia, type 2 diabetes, morbid obesity.  At last visit Diovan  HCT was started.  Ambulatory blood pressure measurements as below.  She feels a little weak first thing in the morning but that improves as the day progresses.     Past Medical/Surgical History: Past Medical History:  Diagnosis Date   Asthma    Cataract    Depression    Diabetes mellitus, type 2 (HCC) 02/2018   Elevated cholesterol    Endometriosis    Generalized anxiety disorder 2017   GERD (gastroesophageal reflux disease)    Glaucoma    HSV-1 infection 06/1998   Hypertension    PTSD (post-traumatic stress disorder) 2017    Past Surgical History:  Procedure Laterality Date   ANTERIOR CRUCIATE LIGAMENT REPAIR  1987,2001,2003,2005   BACK SURGERY  01/2017   due herniated disc   CERVICAL FUSION  10/21/2018   EYE SURGERY Bilateral 2016   release pressure    LAPAROSCOPIC CHOLECYSTECTOMY  5/14   [place], [place]   LAPAROSCOPIC TOTAL HYSTERECTOMY  6/11   left knee surgery  09/2011   NASAL SINUS SURGERY  1992   REPLACEMENT TOTAL KNEE Left 9/14   Dr. Freada Jacobs   SHOULDER SURGERY Right 09/08/2015   due to injury    SHOULDER SURGERY Right 03/20/2016   due to injury   SHOULDER SURGERY Left 09/10/2017   due rotator cuff    Social History:  reports that she has been smoking cigarettes. She has never used smokeless tobacco. She reports current alcohol use of about 14.0 - 21.0 standard drinks of alcohol per week. She reports that she does not use drugs.  Allergies: No Known Allergies  Family History:  Family History  Problem Relation Age of Onset   Coronary artery disease Mother        PAD   Heart attack Mother 39   Diabetes Mother    Lung cancer Father     Heart attack Father 23   Hypertension Brother    High Cholesterol Brother      Current Outpatient Medications:    amLODipine  (NORVASC ) 5 MG tablet, TAKE 1 TABLET (5 MG TOTAL) BY MOUTH DAILY., Disp: 90 tablet, Rfl: 1   Blood Glucose Monitoring Suppl (CONTOUR MONITOR) w/Device KIT, 1 Device by Does not apply route daily. Patient is to test once daily. Dx E11.9, Disp: 1 kit, Rfl: 1   cyanocobalamin  (VITAMIN B12) 1000 MCG/ML injection, Inject 1 ml into the muscle once a week for a month.  Then injected 1 ml once a month thereafter, Disp: 6 mL, Rfl: 11   diclofenac  sodium (VOLTAREN ) 1 % GEL, Apply 3 gm to 3 large joints up to 3 times a day.Dispense 3 tubes with 3 refills., Disp: 5 Tube, Rfl: 0   Dulaglutide  (TRULICITY ) 0.75 MG/0.5ML SOPN, INJECT 0.75 MG SUBCUTANEOUSLY ONE TIME PER WEEK, Disp: 6 mL, Rfl: 8   ezetimibe  (ZETIA ) 10 MG tablet, TAKE 1 TABLET BY MOUTH EVERY DAY, Disp: 90 tablet, Rfl: 1   glucose blood (CONTOUR NEXT TEST) test strip, Use to check sugar daily Dx E11.9, Disp: 100 strip, Rfl: 3   HYDROcodone -acetaminophen  (NORCO/VICODIN) 5-325 MG tablet, Take 1 tablet by mouth every 4 (four) hours as needed for moderate pain (pain score 4-6).,  Disp: 10 tablet, Rfl: 0   metFORMIN  (GLUCOPHAGE ) 1000 MG tablet, TAKE 1 TABLET (1,000 MG TOTAL) BY MOUTH TWICE A DAY WITH FOOD, Disp: 180 tablet, Rfl: 1   methocarbamol  (ROBAXIN ) 500 MG tablet, Take 1 tablet (500 mg total) by mouth 4 (four) times daily., Disp: 20 tablet, Rfl: 0   Microlet Lancets MISC, USE AS DIRECTED EVERY DAY, Disp: 100 each, Rfl: 11   nebivolol  (BYSTOLIC ) 5 MG tablet, TAKE 1 TABLET (5 MG TOTAL) BY MOUTH DAILY., Disp: 30 tablet, Rfl: 5   omeprazole  (PRILOSEC) 40 MG capsule, TAKE 1 CAPSULE BY MOUTH EVERY DAY, Disp: 90 capsule, Rfl: 1   scopolamine  (TRANSDERM-SCOP) 1 MG/3DAYS, Place 1 patch (1.5 mg total) onto the skin every 3 (three) days., Disp: 10 patch, Rfl: 0   simvastatin  (ZOCOR ) 20 MG tablet, TAKE 1 TABLET BY MOUTH DAILY AT 6  PM., Disp: 90 tablet, Rfl: 1   timolol (BETIMOL) 0.25 % ophthalmic solution, 1-2 drops 2 (two) times daily., Disp: , Rfl:    valsartan -hydrochlorothiazide  (DIOVAN -HCT) 160-25 MG tablet, Take 1 tablet by mouth daily., Disp: 90 tablet, Rfl: 1  Review of Systems:  Negative unless indicated in HPI.   Physical Exam: Vitals:   09/17/23 0700  BP: 130/78  Pulse: 85  Temp: 98.5 F (36.9 C)  TempSrc: Oral  SpO2: 98%  Weight: 230 lb 4.8 oz (104.5 kg)    Body mass index is 36.61 kg/m.   Physical Exam Vitals reviewed.  Constitutional:      Appearance: Normal appearance. She is obese.  HENT:     Head: Normocephalic and atraumatic.  Eyes:     Conjunctiva/sclera: Conjunctivae normal.  Cardiovascular:     Rate and Rhythm: Normal rate and regular rhythm.  Pulmonary:     Effort: Pulmonary effort is normal.     Breath sounds: Normal breath sounds.  Skin:    General: Skin is warm and dry.  Neurological:     General: No focal deficit present.     Mental Status: She is alert and oriented to person, place, and time.  Psychiatric:        Mood and Affect: Mood normal.        Behavior: Behavior normal.        Thought Content: Thought content normal.        Judgment: Judgment normal.     Flowsheet Row Office Visit from 09/17/2023 in [facility] [place]  PHQ-9 Total Score 8        Impression and Plan:  Type 2 diabetes mellitus with other specified complication, without long-term current use of insulin (HCC) Assessment & Plan: Well-controlled today with an A1c of 6.4.  Orders: -     POCT glycosylated hemoglobin (Hb A1C)  Dyslipidemia Assessment & Plan: At goal with most recent LDL at 67.   Primary hypertension Assessment & Plan: Now well-controlled on amlodipine , Nebivolol  and Diovan  HCT.  Check renal function and electrolytes today.  Orders: -     Basic metabolic panel with GFR; Future     Time spent:31 minutes reviewing chart, interviewing  and examining patient and formulating plan of care.     Marguerita Shih, MD [facility] at [facility]

## 2023-09-17 NOTE — Assessment & Plan Note (Signed)
 At goal with most recent LDL at 67.

## 2023-09-18 NOTE — Telephone Encounter (Signed)
Spoke to patient and reviewed lab results. 

## 2023-09-25 ENCOUNTER — Ambulatory Visit: Admitting: Family Medicine

## 2023-09-25 ENCOUNTER — Ambulatory Visit: Payer: Self-pay

## 2023-09-25 ENCOUNTER — Encounter: Payer: Self-pay | Admitting: Family Medicine

## 2023-09-25 VITALS — BP 132/74 | HR 93 | Temp 98.5°F | Ht 66.5 in | Wt 225.4 lb

## 2023-09-25 DIAGNOSIS — R5383 Other fatigue: Secondary | ICD-10-CM | POA: Diagnosis not present

## 2023-09-25 DIAGNOSIS — I10 Essential (primary) hypertension: Secondary | ICD-10-CM

## 2023-09-25 DIAGNOSIS — E871 Hypo-osmolality and hyponatremia: Secondary | ICD-10-CM

## 2023-09-25 LAB — BASIC METABOLIC PANEL WITH GFR
BUN: 7 mg/dL (ref 7–25)
CO2: 23 mmol/L (ref 20–32)
Calcium: 10.2 mg/dL (ref 8.6–10.4)
Chloride: 87 mmol/L — ABNORMAL LOW (ref 98–110)
Creat: 0.96 mg/dL (ref 0.50–1.03)
Glucose, Bld: 125 mg/dL — ABNORMAL HIGH (ref 65–99)
Potassium: 4.4 mmol/L (ref 3.5–5.3)
Sodium: 124 mmol/L — ABNORMAL LOW (ref 135–146)
eGFR: 69 mL/min/{1.73_m2} (ref >=60)

## 2023-09-25 MED ORDER — VALSARTAN 160 MG PO TABS: 160.0000 mg | ORAL_TABLET | Freq: Every day | ORAL | 0 refills | Status: DC

## 2023-09-25 NOTE — Telephone Encounter (Signed)
 noted

## 2023-09-25 NOTE — Telephone Encounter (Signed)
 FYI Only or Action Required?: Action required by provider  Patient was last seen in primary care on 09/17/2023 by Brenda Obrien, Brenda Coppersmith, MD. Called Nurse Triage reporting Fatigue. Symptoms began yesterday. Interventions attempted: Rest, hydration, or home remedies. Symptoms are: gradually worsening.  Triage Disposition: See HCP Within 4 Hours (Or PCP Triage)  Patient/caregiver understands and will follow disposition?: YesCopied from CRM [id number]. Topic: Clinical - Red Word Triage >> Sep 25, 2023  8:07 AM Brenda Obrien wrote: Red Word that prompted transfer to Nurse Triage: weakness from valsartan -hydrochlorothiazide  (DIOVAN -HCT) 160-25 MG tablet medication.Getting worse Reason for Disposition  [1] MODERATE weakness (i.e., interferes with work, school, normal activities) AND [2] cause unknown  (Exceptions: Weakness from acute minor illness or poor fluid intake; weakness is chronic and not worse.)  Answer Assessment - Initial Assessment Questions 1. DESCRIPTION: Describe how you are feeling.     Just dont feel like doing anything 2. SEVERITY: How bad is it?  Can you stand and walk?   - MILD (0-3): Feels weak or tired, but does not interfere with work, school or normal activities.   - MODERATE (4-7): Able to stand and walk; weakness interferes with work, school, or normal activities.   - SEVERE (8-10): Unable to stand or walk; unable to do usual activities.     moderate 3. ONSET: When did these symptoms begin? (e.g., hours, days, weeks, months)     Has gotten worse since 6/4 4. CAUSE: What do you think is causing the weakness or fatigue? (e.g., not drinking enough fluids, medical problem, trouble sleeping)     Dehydration and possible medication  5. NEW MEDICINES:  Have you started on any new medicines recently? (e.g., opioid pain medicines, benzodiazepines, muscle relaxants, antidepressants, antihistamines, neuroleptics, beta blockers)     Denies  6. OTHER SYMPTOMS: Do you have any  other symptoms? (e.g., chest pain, fever, cough, SOB, vomiting, diarrhea, bleeding, other areas of pain)     Denies      Pt stated she went to grocery store this morning and stated I took all I had to go. I just can't get moving. I dont understand. Pt states BP has been in running 120s/70s. Pt has not taken morning meds yet. Pt is afraid she will feel like she did yesterday where it was hard to move. Pt feels this has worsened since 6/4 appt. Pt has been drinking gatorade and 6-8 twenty oz bottles of water daily.  Pt denies all other symptoms.  Protocols used: Weakness (Generalized) and Fatigue-A-AH

## 2023-09-25 NOTE — Progress Notes (Signed)
 Established Patient Office Visit   Subjective  Patient ID: Brenda Obrien, female    DOB: 06-01-1965  Age: 58 y.o. MRN: [id number]  Chief Complaint  Patient presents with   Medical Management of Chronic Issues    Patient came in today for Fatigue, patient was seen on 6/4 and had labs done, new BP med given, patient has been feeling weak and tired, no medication was taken today     Patient is a 58 year old female followed by Dr. Ival Marines and seen for acute concern.  Patient reports feeling increased fatigue, worse in the last week.  Labs from 09/17/2023 with hyponatremia as sodium 125.  Patient advised to have labs rechecked.  Patient taking Norvasc  5 mg, Nebivolol  5 mg, and valsartan -HCTZ 160-25 mg for BP.  Patient states valsartan -HCTZ is new, started in February after lisinopril  was not effective at lowering BP.  Patient did have dental work on Monday which required laughing gas and Valium.  Patient states she slept for the remainder of the day and felt extremely fatigued.  Denies changes in diet, medications, or supplements.  Monitoring BP at home consistently.  Patient did not take BP meds this morning.    Patient Active Problem List   Diagnosis Date Noted   Vitamin B12 deficiency 02/14/2022   Cardiac risk counseling 02/22/2019   Elevated liver function tests 02/22/2019   Well woman exam without gynecological exam 02/15/2019   Risk for coronary artery disease between 10% and 20% in next 10 years 02/15/2019   Cervical spondylosis with radiculopathy 08/24/2018   HLD (hyperlipidemia) 04/27/2018   Type 2 diabetes mellitus (HCC) 03/11/2018   Primary osteoarthritis of right knee 10/24/2017   Primary osteoarthritis of both hands 10/24/2017   Primary osteoarthritis of left hip 10/24/2017   DDD (degenerative disc disease), lumbar 11/18/2016   Severe episode of recurrent major depressive disorder, without psychotic features (HCC) 03/15/2016   MDD (major depressive disorder), recurrent  episode, severe (HCC) 03/15/2016   Insomnia 12/27/2015   Adhesive capsulitis of right shoulder 12/21/2015   Morbid obesity (HCC) 09/20/2013   Carpal tunnel syndrome 09/20/2013   Allergic rhinitis 09/20/2013   H/O total knee replacement 01/25/2013   Depression 12/23/2012   Anxiety 03/13/2012   Hypertension 01/21/2012   Anterior cruciate ligament complete tear 07/01/2011   Tachycardia 11/22/2010   Dyslipidemia 11/22/2010   GERD 03/27/2009   Past Medical History:  Diagnosis Date   Asthma    Cataract    Depression    Diabetes mellitus, type 2 (HCC) 02/2018   Elevated cholesterol    Endometriosis    Generalized anxiety disorder 2017   GERD (gastroesophageal reflux disease)    Glaucoma    HSV-1 infection 06/1998   Hypertension    PTSD (post-traumatic stress disorder) 2017   Past Surgical History:  Procedure Laterality Date   ANTERIOR CRUCIATE LIGAMENT REPAIR  1987,2001,2003,2005   BACK SURGERY  01/2017   due herniated disc   CERVICAL FUSION  10/21/2018   EYE SURGERY Bilateral 2016   release pressure    LAPAROSCOPIC CHOLECYSTECTOMY  5/14   [place], [place]   LAPAROSCOPIC TOTAL HYSTERECTOMY  6/11   left knee surgery  09/2011   NASAL SINUS SURGERY  1992   REPLACEMENT TOTAL KNEE Left 9/14   Dr. Freada Jacobs   SHOULDER SURGERY Right 09/08/2015   due to injury    SHOULDER SURGERY Right 03/20/2016   due to injury   SHOULDER SURGERY Left 09/10/2017   due rotator cuff   Social  History   Tobacco Use   Smoking status: Every Day    Current packs/day: 1.00    Types: Cigarettes   Smokeless tobacco: Never  Vaping Use   Vaping status: Never Used  Substance Use Topics   Alcohol use: Yes    Alcohol/week: 14.0 - 21.0 standard drinks of alcohol    Types: 14 - 21 Cans of beer per week   Drug use: Never   Family History  Problem Relation Age of Onset   Coronary artery disease Mother        PAD   Heart attack Mother 4   Diabetes Mother    Lung cancer Father    Heart attack Father  79   Hypertension Brother    High Cholesterol Brother    No Known Allergies  ROS Negative unless stated above    Objective:     BP 132/74 (BP Location: Left Arm, Patient Position: Sitting, Cuff Size: Large)   Pulse 93   Temp 98.5 F (36.9 C) (Oral)   Ht 5' 6.5 (1.689 m)   Wt 225 lb 6.4 oz (102.2 kg)   SpO2 98%   BMI 35.84 kg/m  BP Readings from Last 3 Encounters:  09/25/23 132/74  09/17/23 130/78  06/12/23 (!) 150/80   Wt Readings from Last 3 Encounters:  09/25/23 225 lb 6.4 oz (102.2 kg)  09/17/23 230 lb 4.8 oz (104.5 kg)  06/12/23 229 lb 11.2 oz (104.2 kg)      Physical Exam Constitutional:      General: She is not in acute distress.    Appearance: Normal appearance.  HENT:     Head: Normocephalic and atraumatic.     Nose: Nose normal.     Mouth/Throat:     Mouth: Mucous membranes are moist.   Cardiovascular:     Rate and Rhythm: Normal rate and regular rhythm.     Heart sounds: Normal heart sounds. No murmur heard.    No gallop.  Pulmonary:     Effort: Pulmonary effort is normal. No respiratory distress.     Breath sounds: Normal breath sounds. No wheezing, rhonchi or rales.   Skin:    General: Skin is warm and dry.   Neurological:     Mental Status: She is alert and oriented to person, place, and time.        09/17/2023    7:20 AM 06/12/2023    7:42 AM 05/08/2023    7:54 AM  Depression screen PHQ 2/9  Decreased Interest 2 1 1   Down, Depressed, Hopeless 1 1 1   PHQ - 2 Score 3 2 2   Altered sleeping 3 2 2   Tired, decreased energy 1 0   Change in appetite 0 0 0  Feeling bad or failure about yourself   0 0  Trouble concentrating 1 1 1   Moving slowly or fidgety/restless 0 0 0  Suicidal thoughts 0 0 0  PHQ-9 Score 8 5 5   Difficult doing work/chores Somewhat difficult  Somewhat difficult      09/17/2023    7:20 AM 02/19/2023    7:18 AM 02/15/2019    8:25 AM 02/18/2018   10:04 AM  GAD 7 : Generalized Anxiety Score  Nervous, Anxious, on Edge 3 2  2 2   Control/stop worrying 1 0 2 2  Worry too much - different things 1  2 1   Trouble relaxing 1 2 2 1   Restless 0 0 0 0  Easily annoyed or irritable 1 1 1  0  Afraid - awful might happen 2 1 1  0  Total GAD 7 Score 9  10 6   Anxiety Difficulty Somewhat difficult Somewhat difficult Somewhat difficult      No results found for any visits on 09/25/23.    Assessment & Plan:   Hyponatremia  Fatigue, unspecified type  Primary hypertension -     Valsartan ; Take 1 tablet (160 mg total) by mouth daily.  Dispense: 90 tablet; Refill: 0  Patient with fatigue worse in the last week.  Concern symptoms related to hyponatremia.  Sodium 125 on 09/17/2023.  Repeat BMP previously ordered, obtained this visit.  Will d/c valsartan -HCTZ 160-25 mg daily.  Rx for valsartan  160 mg daily sent to pharmacy.  Will have patient follow-up in the next 2-3 weeks, sooner if needed based on results with plans to repeat sodium.  For continued hyponatremia consider urine studies.  Return in about 3 weeks (around 10/16/2023) for blood pressure.   Viola Greulich, MD

## 2023-09-25 NOTE — Patient Instructions (Signed)
 A new prescription for valsartan  160 mg was sent to your pharmacy.  Continue taking this with amlodipine  5 mg daily and Nebivolol  5 mg daily.

## 2023-10-03 ENCOUNTER — Ambulatory Visit: Payer: Self-pay | Admitting: Family Medicine

## 2023-10-03 NOTE — Telephone Encounter (Signed)
-----   Message from Viola Greulich sent at 10/03/2023 12:28 PM EDT ----- Continued hyponatremia.  Would repeat labs next week to see if change in BP medication has improved readings. ----- Message ----- From: Addison Holster Lab Results In Sent: 09/25/2023  11:51 PM EDT To: Viola Greulich, MD

## 2023-10-03 NOTE — Telephone Encounter (Signed)
 Spoke with patient about recent labs. Patient states had to discontinue the valsartan  due to feeling really weak. Has went back to taking the lisinopril . BP has been excellent.

## 2023-10-06 ENCOUNTER — Ambulatory Visit: Payer: Self-pay | Admitting: Internal Medicine

## 2023-10-06 ENCOUNTER — Other Ambulatory Visit (INDEPENDENT_AMBULATORY_CARE_PROVIDER_SITE_OTHER)

## 2023-10-06 ENCOUNTER — Ambulatory Visit (INDEPENDENT_AMBULATORY_CARE_PROVIDER_SITE_OTHER)

## 2023-10-06 DIAGNOSIS — E538 Deficiency of other specified B group vitamins: Secondary | ICD-10-CM | POA: Diagnosis not present

## 2023-10-06 DIAGNOSIS — E871 Hypo-osmolality and hyponatremia: Secondary | ICD-10-CM

## 2023-10-06 LAB — BASIC METABOLIC PANEL WITH GFR
BUN: 8 mg/dL (ref 6–23)
CO2: 27 meq/L (ref 19–32)
Calcium: 9.7 mg/dL (ref 8.4–10.5)
Chloride: 98 meq/L (ref 96–112)
Creatinine, Ser: 0.83 mg/dL (ref 0.40–1.20)
GFR: 78.74 mL/min (ref >60.00)
Glucose, Bld: 108 mg/dL — ABNORMAL HIGH (ref 70–99)
Potassium: 4.6 meq/L (ref 3.5–5.1)
Sodium: 133 meq/L — ABNORMAL LOW (ref 135–145)

## 2023-10-06 MED ORDER — CYANOCOBALAMIN 1000 MCG/ML IJ SOLN
1000.0000 ug | Freq: Once | INTRAMUSCULAR | Status: AC
Start: 2023-10-06 — End: 2023-10-06
  Administered 2023-10-06: 1000 ug via INTRAMUSCULAR

## 2023-10-06 NOTE — Progress Notes (Signed)
 Patient is in office today for a nurse visit for B12 Injection. Patient Injection was given in the  Right deltoid. Patient tolerated injection well.

## 2023-10-16 ENCOUNTER — Ambulatory Visit: Admitting: Internal Medicine

## 2023-10-16 ENCOUNTER — Encounter: Payer: Self-pay | Admitting: Internal Medicine

## 2023-10-16 VITALS — BP 130/80 | HR 88 | Temp 98.4°F | Wt 218.9 lb

## 2023-10-16 DIAGNOSIS — E871 Hypo-osmolality and hyponatremia: Secondary | ICD-10-CM

## 2023-10-16 DIAGNOSIS — I10 Essential (primary) hypertension: Secondary | ICD-10-CM | POA: Diagnosis not present

## 2023-10-16 MED ORDER — LISINOPRIL 40 MG PO TABS: 40.0000 mg | ORAL_TABLET | Freq: Every day | ORAL | 1 refills | Status: DC

## 2023-10-16 NOTE — Progress Notes (Signed)
 Established Patient Office Visit     CC/Reason for Visit: Follow-up hypertension and hyponatremia  HPI: Brenda Obrien is a 58 y.o. female who is coming in today for the above mentioned reasons.  She was taken off lisinopril  and placed on Diovan  HCT due to uncontrolled hypertension.  Unfortunately she developed extreme fatigue and weakness and was found to be hyponatremic with a sodium of 124.  On follow-up and she was placed on Diovan  alone without HCT.  Instead she went back to lisinopril  she had been taking previously.  She is feeling back to baseline.  Blood pressure measurements at home as follows:  111/73 127/78 112/70 117/77 128/77 109/73   Past Medical/Surgical History: Past Medical History:  Diagnosis Date   Asthma    Cataract    Depression    Diabetes mellitus, type 2 (HCC) 02/2018   Elevated cholesterol    Endometriosis    Generalized anxiety disorder 2017   GERD (gastroesophageal reflux disease)    Glaucoma    HSV-1 infection 06/1998   Hypertension    PTSD (post-traumatic stress disorder) 2017    Past Surgical History:  Procedure Laterality Date   ANTERIOR CRUCIATE LIGAMENT REPAIR  1987,2001,2003,2005   BACK SURGERY  01/2017   due herniated disc   CERVICAL FUSION  10/21/2018   EYE SURGERY Bilateral 2016   release pressure    LAPAROSCOPIC CHOLECYSTECTOMY  5/14   [place], [place]   LAPAROSCOPIC TOTAL HYSTERECTOMY  6/11   left knee surgery  09/2011   NASAL SINUS SURGERY  1992   REPLACEMENT TOTAL KNEE Left 9/14   Dr. Alm Lunger   SHOULDER SURGERY Right 09/08/2015   due to injury    SHOULDER SURGERY Right 03/20/2016   due to injury   SHOULDER SURGERY Left 09/10/2017   due rotator cuff    Social History:  reports that she has been smoking cigarettes. She has never used smokeless tobacco. She reports current alcohol use of about 14.0 - 21.0 standard drinks of alcohol per week. She reports that she does not use drugs.  Allergies: No Known  Allergies  Family History:  Family History  Problem Relation Age of Onset   Coronary artery disease Mother        PAD   Heart attack Mother 8   Diabetes Mother    Lung cancer Father    Heart attack Father 77   Hypertension Brother    High Cholesterol Brother      Current Outpatient Medications:    amLODipine  (NORVASC ) 5 MG tablet, TAKE 1 TABLET (5 MG TOTAL) BY MOUTH DAILY., Disp: 90 tablet, Rfl: 1   Blood Glucose Monitoring Suppl (CONTOUR MONITOR) w/Device KIT, 1 Device by Does not apply route daily. Patient is to test once daily. Dx E11.9, Disp: 1 kit, Rfl: 1   cyanocobalamin  (VITAMIN B12) 1000 MCG/ML injection, Inject 1 ml into the muscle once a week for a month.  Then injected 1 ml once a month thereafter, Disp: 6 mL, Rfl: 11   diclofenac  sodium (VOLTAREN ) 1 % GEL, Apply 3 gm to 3 large joints up to 3 times a day.Dispense 3 tubes with 3 refills., Disp: 5 Tube, Rfl: 0   Dulaglutide  (TRULICITY ) 0.75 MG/0.5ML SOPN, INJECT 0.75 MG SUBCUTANEOUSLY ONE TIME PER WEEK, Disp: 6 mL, Rfl: 8   ezetimibe  (ZETIA ) 10 MG tablet, TAKE 1 TABLET BY MOUTH EVERY DAY, Disp: 90 tablet, Rfl: 1   glucose blood (CONTOUR NEXT TEST) test strip, Use to check sugar  daily Dx E11.9, Disp: 100 strip, Rfl: 3   metFORMIN  (GLUCOPHAGE ) 1000 MG tablet, TAKE 1 TABLET (1,000 MG TOTAL) BY MOUTH TWICE A DAY WITH FOOD, Disp: 180 tablet, Rfl: 1   methocarbamol  (ROBAXIN ) 500 MG tablet, Take 1 tablet (500 mg total) by mouth 4 (four) times daily., Disp: 20 tablet, Rfl: 0   Microlet Lancets MISC, USE AS DIRECTED EVERY DAY, Disp: 100 each, Rfl: 11   nebivolol  (BYSTOLIC ) 5 MG tablet, TAKE 1 TABLET (5 MG TOTAL) BY MOUTH DAILY., Disp: 30 tablet, Rfl: 5   omeprazole  (PRILOSEC) 40 MG capsule, TAKE 1 CAPSULE BY MOUTH EVERY DAY, Disp: 90 capsule, Rfl: 1   scopolamine  (TRANSDERM-SCOP) 1 MG/3DAYS, Place 1 patch (1.5 mg total) onto the skin every 3 (three) days., Disp: 10 patch, Rfl: 0   simvastatin  (ZOCOR ) 20 MG tablet, TAKE 1 TABLET BY  MOUTH DAILY AT 6 PM., Disp: 90 tablet, Rfl: 1   timolol (BETIMOL) 0.25 % ophthalmic solution, 1-2 drops 2 (two) times daily., Disp: , Rfl:    lisinopril  (ZESTRIL ) 40 MG tablet, Take 1 tablet (40 mg total) by mouth daily., Disp: 90 tablet, Rfl: 1  Review of Systems:  Negative unless indicated in HPI.   Physical Exam: Vitals:   10/16/23 0756  BP: 130/80  Pulse: 88  Temp: 98.4 F (36.9 C)  TempSrc: Oral  SpO2: 99%  Weight: 218 lb 14.4 oz (99.3 kg)    Body mass index is 34.8 kg/m.   Impression and Plan:  Primary hypertension -     Lisinopril ; Take 1 tablet (40 mg total) by mouth daily.  Dispense: 90 tablet; Refill: 1  Hyponatremia   - Blood pressure is well-controlled, hyponatremia has resolved.  Okay to continue lisinopril  for now.  Time spent:21 minutes reviewing chart, interviewing and examining patient and formulating plan of care.     Brenda Theophilus Andrews, MD [facility] at [facility]

## 2023-10-23 ENCOUNTER — Other Ambulatory Visit: Payer: Self-pay | Admitting: Internal Medicine

## 2023-10-23 MED ORDER — CONTOUR NEXT TEST VI STRP: ORAL_STRIP | 3 refills | Status: AC

## 2023-10-23 MED ORDER — MICROLET LANCETS MISC: 11 refills | Status: AC

## 2023-10-23 NOTE — Telephone Encounter (Signed)
 Copied from CRM [id number]. Topic: Clinical - Medication Refill >> Oct 23, 2023  8:09 AM [place] E wrote: Medication: glucose blood (CONTOUR NEXT TEST) test strip Microlet Lancets MISC  Has the patient contacted their pharmacy? Yes (Agent: If no, request that the patient contact the pharmacy for the refill. If patient does not wish to contact the pharmacy document the reason why and proceed with request.) (Agent: If yes, when and what did the pharmacy advise?)  This is the patient's preferred pharmacy:  [id number] [place], [place] - [address] [place] [address] Phone: [phone] Fax: [phone]  Is this the correct pharmacy for this prescription? Yes If no, delete pharmacy and type the correct one.   Has the prescription been filled recently? No  Is the patient out of the medication? Out of test strips, about 10 lancets left.  Has the patient been seen for an appointment in the last year OR does the patient have an upcoming appointment? Yes  Can we respond through MyChart? Yes  Agent: Please be advised that Rx refills may take up to 3 business days. We ask that you follow-up with your pharmacy.

## 2023-11-05 ENCOUNTER — Ambulatory Visit (INDEPENDENT_AMBULATORY_CARE_PROVIDER_SITE_OTHER)

## 2023-11-05 DIAGNOSIS — E538 Deficiency of other specified B group vitamins: Secondary | ICD-10-CM | POA: Diagnosis not present

## 2023-11-05 MED ORDER — CYANOCOBALAMIN 1000 MCG/ML IJ SOLN
1000.0000 ug | Freq: Once | INTRAMUSCULAR | Status: AC
  Administered 2023-11-05: 1000 ug via INTRAMUSCULAR

## 2023-11-05 NOTE — Progress Notes (Signed)
 Patient is in office today for a nurse visit for B12 Injection. Patient Injection was given in the  Left deltoid. Patient tolerated injection well.

## 2023-11-17 ENCOUNTER — Other Ambulatory Visit: Payer: Self-pay | Admitting: Internal Medicine

## 2023-11-17 DIAGNOSIS — E782 Mixed hyperlipidemia: Secondary | ICD-10-CM

## 2023-11-17 DIAGNOSIS — I10 Essential (primary) hypertension: Secondary | ICD-10-CM

## 2023-12-05 ENCOUNTER — Ambulatory Visit

## 2023-12-05 DIAGNOSIS — E538 Deficiency of other specified B group vitamins: Secondary | ICD-10-CM | POA: Diagnosis not present

## 2023-12-05 MED ORDER — CYANOCOBALAMIN 1000 MCG/ML IJ SOLN
1000.0000 ug | Freq: Once | INTRAMUSCULAR | Status: AC
Start: 2023-12-05 — End: 2023-12-05
  Administered 2023-12-05: 1000 ug via INTRAMUSCULAR

## 2023-12-05 NOTE — Progress Notes (Signed)
 Patient is in office today for a nurse visit for B12 Injection. Patient Injection was given in the  Right deltoid. Patient tolerated injection well.

## 2023-12-22 ENCOUNTER — Ambulatory Visit: Admitting: Internal Medicine

## 2023-12-22 ENCOUNTER — Encounter: Payer: Self-pay | Admitting: Internal Medicine

## 2023-12-22 VITALS — BP 130/80 | HR 95 | Temp 98.2°F | Wt 206.5 lb

## 2023-12-22 DIAGNOSIS — E785 Hyperlipidemia, unspecified: Secondary | ICD-10-CM

## 2023-12-22 DIAGNOSIS — Z23 Encounter for immunization: Secondary | ICD-10-CM | POA: Diagnosis not present

## 2023-12-22 DIAGNOSIS — R531 Weakness: Secondary | ICD-10-CM

## 2023-12-22 DIAGNOSIS — I10 Essential (primary) hypertension: Secondary | ICD-10-CM

## 2023-12-22 DIAGNOSIS — E1169 Type 2 diabetes mellitus with other specified complication: Secondary | ICD-10-CM | POA: Diagnosis not present

## 2023-12-22 LAB — COMPREHENSIVE METABOLIC PANEL WITH GFR
ALT: 19 U/L (ref 0–35)
AST: 17 U/L (ref 0–37)
Albumin: 4.7 g/dL (ref 3.5–5.2)
Alkaline Phosphatase: 83 U/L (ref 39–117)
BUN: 7 mg/dL (ref 6–23)
CO2: 26 meq/L (ref 19–32)
Calcium: 10.2 mg/dL (ref 8.4–10.5)
Chloride: 101 meq/L (ref 96–112)
Creatinine, Ser: 0.82 mg/dL (ref 0.40–1.20)
GFR: 79.77 mL/min (ref >60.00)
Glucose, Bld: 100 mg/dL — ABNORMAL HIGH (ref 70–99)
Potassium: 4.2 meq/L (ref 3.5–5.1)
Sodium: 137 meq/L (ref 135–145)
Total Bilirubin: 0.5 mg/dL (ref 0.2–1.2)
Total Protein: 7.9 g/dL (ref 6.0–8.3)

## 2023-12-22 LAB — TSH: TSH: 1.41 u[IU]/mL (ref 0.35–5.50)

## 2023-12-22 LAB — POCT GLYCOSYLATED HEMOGLOBIN (HGB A1C): Hemoglobin A1C: 5.8 % — AB (ref 4.0–5.6)

## 2023-12-22 LAB — VITAMIN B12: Vitamin B-12: 341 pg/mL (ref 211–911)

## 2023-12-22 NOTE — Assessment & Plan Note (Signed)
 Well-controlled today with an A1c of 5.8.

## 2023-12-22 NOTE — Assessment & Plan Note (Signed)
 At goal with most recent LDL at 67.

## 2023-12-22 NOTE — Progress Notes (Signed)
 Established Patient Office Visit     CC/Reason for Visit: Follow-up chronic conditions, discuss acute concern  HPI: Brenda Obrien is a 58 y.o. female who is coming in today for the above mentioned reasons. Past Medical History is significant for: 2 diabetes, hyperlipidemia, hypertension, depression.  She was recently taken off Diovan  HCT due to significant hyponatremia with weakness and fatigue.  She has started aggressive lifestyle changes and has dropped significant weight since last seen.  She is again feeling very weak and fatigued similar to when she had hyponatremia.  She has been keeping detailed blood pressure measurements as follows:  116/73 107/68 130/84 121/75 110/77 She has also had some isolated lows with systolics in the 80 and 90 range.  She states she definitely felt worse.   Past Medical/Surgical History: Past Medical History:  Diagnosis Date   Asthma    Cataract    Depression    Diabetes mellitus, type 2 (HCC) 02/2018   Elevated cholesterol    Endometriosis    Generalized anxiety disorder 2017   GERD (gastroesophageal reflux disease)    Glaucoma    HSV-1 infection 06/1998   Hypertension    PTSD (post-traumatic stress disorder) 2017    Past Surgical History:  Procedure Laterality Date   ANTERIOR CRUCIATE LIGAMENT REPAIR  1987,2001,2003,2005   BACK SURGERY  01/2017   due herniated disc   CERVICAL FUSION  10/21/2018   EYE SURGERY Bilateral 2016   release pressure    LAPAROSCOPIC CHOLECYSTECTOMY  5/14   [place], [place]   LAPAROSCOPIC TOTAL HYSTERECTOMY  6/11   left knee surgery  09/2011   NASAL SINUS SURGERY  1992   REPLACEMENT TOTAL KNEE Left 9/14   Dr. Alm Lunger   SHOULDER SURGERY Right 09/08/2015   due to injury    SHOULDER SURGERY Right 03/20/2016   due to injury   SHOULDER SURGERY Left 09/10/2017   due rotator cuff    Social History:  reports that she has been smoking cigarettes. She has never used smokeless tobacco. She reports current  alcohol use of about 14.0 - 21.0 standard drinks of alcohol per week. She reports that she does not use drugs.  Allergies: No Known Allergies  Family History:  Family History  Problem Relation Age of Onset   Coronary artery disease Mother        PAD   Heart attack Mother 68   Diabetes Mother    Lung cancer Father    Heart attack Father 56   Hypertension Brother    High Cholesterol Brother      Current Outpatient Medications:    amLODipine  (NORVASC ) 5 MG tablet, TAKE 1 TABLET (5 MG TOTAL) BY MOUTH DAILY., Disp: 30 tablet, Rfl: 5   Blood Glucose Monitoring Suppl (CONTOUR MONITOR) w/Device KIT, 1 Device by Does not apply route daily. Patient is to test once daily. Dx E11.9, Disp: 1 kit, Rfl: 1   cyanocobalamin  (VITAMIN B12) 1000 MCG/ML injection, Inject 1 ml into the muscle once a week for a month.  Then injected 1 ml once a month thereafter, Disp: 6 mL, Rfl: 11   diclofenac  sodium (VOLTAREN ) 1 % GEL, Apply 3 gm to 3 large joints up to 3 times a day.Dispense 3 tubes with 3 refills., Disp: 5 Tube, Rfl: 0   Dulaglutide  (TRULICITY ) 0.75 MG/0.5ML SOPN, INJECT 0.75 MG SUBCUTANEOUSLY ONE TIME PER WEEK, Disp: 6 mL, Rfl: 8   ezetimibe  (ZETIA ) 10 MG tablet, TAKE 1 TABLET BY MOUTH EVERY DAY,  Disp: 90 tablet, Rfl: 1   glucose blood (CONTOUR NEXT TEST) test strip, Use to check sugar daily Dx E11.9, Disp: 100 strip, Rfl: 3   lisinopril  (ZESTRIL ) 40 MG tablet, Take 1 tablet (40 mg total) by mouth daily., Disp: 90 tablet, Rfl: 1   metFORMIN  (GLUCOPHAGE ) 1000 MG tablet, TAKE 1 TABLET (1,000 MG TOTAL) BY MOUTH TWICE A DAY WITH FOOD, Disp: 180 tablet, Rfl: 1   methocarbamol  (ROBAXIN ) 500 MG tablet, Take 1 tablet (500 mg total) by mouth 4 (four) times daily., Disp: 20 tablet, Rfl: 0   Microlet Lancets MISC, USE AS DIRECTED EVERY DAY, Disp: 100 each, Rfl: 11   nebivolol  (BYSTOLIC ) 5 MG tablet, TAKE 1 TABLET (5 MG TOTAL) BY MOUTH DAILY., Disp: 30 tablet, Rfl: 5   omeprazole  (PRILOSEC) 40 MG capsule, TAKE 1  CAPSULE BY MOUTH EVERY DAY, Disp: 90 capsule, Rfl: 1   scopolamine  (TRANSDERM-SCOP) 1 MG/3DAYS, Place 1 patch (1.5 mg total) onto the skin every 3 (three) days., Disp: 10 patch, Rfl: 0   simvastatin  (ZOCOR ) 20 MG tablet, TAKE 1 TABLET BY MOUTH DAILY AT 6 PM., Disp: 30 tablet, Rfl: 5   timolol (BETIMOL) 0.25 % ophthalmic solution, 1-2 drops 2 (two) times daily., Disp: , Rfl:   Review of Systems:  Negative unless indicated in HPI.   Physical Exam: Vitals:   12/22/23 0710  BP: 130/80  Pulse: 95  Temp: 98.2 F (36.8 C)  TempSrc: Oral  SpO2: 99%  Weight: 206 lb 8 oz (93.7 kg)    Body mass index is 32.83 kg/m.   Physical Exam Vitals reviewed.  Constitutional:      Appearance: Normal appearance.  HENT:     Head: Normocephalic and atraumatic.  Eyes:     Conjunctiva/sclera: Conjunctivae normal.     Pupils: Pupils are equal, round, and reactive to light.  Cardiovascular:     Rate and Rhythm: Normal rate and regular rhythm.  Pulmonary:     Effort: Pulmonary effort is normal.     Breath sounds: Normal breath sounds.  Skin:    Obrien: Skin is warm and dry.  Neurological:     Obrien: No focal deficit present.     Mental Status: She is alert and oriented to person, place, and time.  Psychiatric:        Mood and Affect: Mood normal.        Behavior: Behavior normal.        Thought Content: Thought content normal.        Judgment: Judgment normal.      Impression and Plan:  Type 2 diabetes mellitus with other specified complication, without long-term current use of insulin (HCC) Assessment & Plan: Well-controlled today with an A1c of 5.8.  Orders: -     POCT glycosylated hemoglobin (Hb A1C)  Immunization due  Dyslipidemia Assessment & Plan: At goal with most recent LDL at 67.   Morbid obesity (HCC) Assessment & Plan: -Discussed healthy lifestyle, including increased physical activity and better food choices to promote weight loss. - Congratulated on weight loss  success thus far.   Primary hypertension Assessment & Plan: Well-controlled on amlodipine , lisinopril , Nebivolol .   Weakness generalized -     Comprehensive metabolic panel with GFR; Future -     Vitamin B12; Future -     TSH; Future   - Flu vaccine administered in office today. - Check labs to make sure she is not again hyponatremic given her weakness.  She is not hypotensive or  bradycardic.  Time spent:31 minutes reviewing chart, interviewing and examining patient and formulating plan of care.     [name], MD [facility] at [facility]

## 2023-12-22 NOTE — Assessment & Plan Note (Signed)
 Well-controlled on amlodipine , lisinopril , Nebivolol .

## 2023-12-22 NOTE — Assessment & Plan Note (Signed)
-  Discussed healthy lifestyle, including increased physical activity and better food choices to promote weight loss. -Congratulated on weight loss success thus far.

## 2023-12-28 ENCOUNTER — Ambulatory Visit: Payer: Self-pay | Admitting: Internal Medicine

## 2024-01-05 ENCOUNTER — Other Ambulatory Visit: Payer: Self-pay | Admitting: Internal Medicine

## 2024-01-05 ENCOUNTER — Ambulatory Visit (INDEPENDENT_AMBULATORY_CARE_PROVIDER_SITE_OTHER): Admitting: *Deleted

## 2024-01-05 DIAGNOSIS — E1169 Type 2 diabetes mellitus with other specified complication: Secondary | ICD-10-CM

## 2024-01-05 DIAGNOSIS — E538 Deficiency of other specified B group vitamins: Secondary | ICD-10-CM

## 2024-01-05 MED ORDER — CYANOCOBALAMIN 1000 MCG/ML IJ SOLN
1000.0000 ug | Freq: Once | INTRAMUSCULAR | Status: AC
  Administered 2024-01-05: 1000 ug via INTRAMUSCULAR

## 2024-01-05 NOTE — Progress Notes (Signed)
Per orders of Dr. Hernandez, injection of b12 given by Chetan Mehring. Patient tolerated injection well.  

## 2024-01-14 ENCOUNTER — Other Ambulatory Visit: Payer: Self-pay | Admitting: Internal Medicine

## 2024-01-14 DIAGNOSIS — E785 Hyperlipidemia, unspecified: Secondary | ICD-10-CM

## 2024-02-04 ENCOUNTER — Ambulatory Visit: Admitting: *Deleted

## 2024-02-04 DIAGNOSIS — E538 Deficiency of other specified B group vitamins: Secondary | ICD-10-CM

## 2024-02-04 MED ORDER — CYANOCOBALAMIN 1000 MCG/ML IJ SOLN
1000.0000 ug | Freq: Once | INTRAMUSCULAR | Status: AC
  Administered 2024-02-04: 1000 ug via INTRAMUSCULAR

## 2024-02-04 NOTE — Progress Notes (Signed)
 Per orders of Dr. Ardyth Harps, injection of B12 given by Kern Reap. Patient tolerated injection well.

## 2024-02-09 ENCOUNTER — Ambulatory Visit: Payer: Medicare Other

## 2024-02-14 ENCOUNTER — Other Ambulatory Visit: Payer: Self-pay | Admitting: Internal Medicine

## 2024-02-14 DIAGNOSIS — K219 Gastro-esophageal reflux disease without esophagitis: Secondary | ICD-10-CM

## 2024-02-14 DIAGNOSIS — E1165 Type 2 diabetes mellitus with hyperglycemia: Secondary | ICD-10-CM

## 2024-03-08 ENCOUNTER — Ambulatory Visit: Admitting: *Deleted

## 2024-03-08 DIAGNOSIS — E538 Deficiency of other specified B group vitamins: Secondary | ICD-10-CM

## 2024-03-08 MED ORDER — CYANOCOBALAMIN 1000 MCG/ML IJ SOLN
1000.0000 ug | Freq: Once | INTRAMUSCULAR | Status: AC
  Administered 2024-03-08: 1000 ug via INTRAMUSCULAR

## 2024-03-08 NOTE — Progress Notes (Addendum)
Per orders of Dr. Hernandez, injection of b12 given by Chetan Mehring. Patient tolerated injection well.  

## 2024-03-18 ENCOUNTER — Ambulatory Visit

## 2024-03-22 ENCOUNTER — Encounter: Payer: Self-pay | Admitting: Internal Medicine

## 2024-03-22 ENCOUNTER — Ambulatory Visit: Admitting: Internal Medicine

## 2024-03-22 VITALS — BP 120/78 | HR 87 | Temp 98.5°F | Wt 212.0 lb

## 2024-03-22 DIAGNOSIS — F332 Major depressive disorder, recurrent severe without psychotic features: Secondary | ICD-10-CM

## 2024-03-22 DIAGNOSIS — E1169 Type 2 diabetes mellitus with other specified complication: Secondary | ICD-10-CM

## 2024-03-22 DIAGNOSIS — E782 Mixed hyperlipidemia: Secondary | ICD-10-CM

## 2024-03-22 DIAGNOSIS — I10 Essential (primary) hypertension: Secondary | ICD-10-CM

## 2024-03-22 LAB — POCT GLYCOSYLATED HEMOGLOBIN (HGB A1C): Hemoglobin A1C: 5.9 % — AB (ref 4.0–5.6)

## 2024-03-22 NOTE — Assessment & Plan Note (Signed)
 Well-controlled today with an A1c of 5.9.

## 2024-03-22 NOTE — Assessment & Plan Note (Signed)
 On simvastatin  and ezetimibe .  Recheck lipids next visit.  Last LDL at goal at 67.

## 2024-03-22 NOTE — Progress Notes (Addendum)
 Established Patient Office Visit     CC/Reason for Visit: Follow-up chronic conditions  HPI: Brenda Obrien is a 58 y.o. female who is coming in today for the above mentioned reasons. Past Medical History is significant for: Hypertension, hyperlipidemia, type 2 diabetes, depression.  Feeling well.  No acute concerns or complaints.  We discussed that she is due for colon cancer screening and pneumonia vaccine but she would like to defer those for now.   Past Medical/Surgical History: Past Medical History:  Diagnosis Date   Asthma    Cataract    Depression    Diabetes mellitus, type 2 (HCC) 02/2018   Elevated cholesterol    Endometriosis    Generalized anxiety disorder 2017   GERD (gastroesophageal reflux disease)    Glaucoma    HSV-1 infection 06/1998   Hypertension    PTSD (post-traumatic stress disorder) 2017    Past Surgical History:  Procedure Laterality Date   ANTERIOR CRUCIATE LIGAMENT REPAIR  1987,2001,2003,2005   BACK SURGERY  01/2017   due herniated disc   CERVICAL FUSION  10/21/2018   EYE SURGERY Bilateral 2016   release pressure    LAPAROSCOPIC CHOLECYSTECTOMY  5/14   [place], [place]   LAPAROSCOPIC TOTAL HYSTERECTOMY  6/11   left knee surgery  09/2011   NASAL SINUS SURGERY  1992   REPLACEMENT TOTAL KNEE Left 9/14   Dr. Alm Lunger   SHOULDER SURGERY Right 09/08/2015   due to injury    SHOULDER SURGERY Right 03/20/2016   due to injury   SHOULDER SURGERY Left 09/10/2017   due rotator cuff    Social History:  reports that she has been smoking cigarettes. She has never used smokeless tobacco. She reports current alcohol use of about 14.0 - 21.0 standard drinks of alcohol per week. She reports that she does not use drugs.  Allergies: No Known Allergies  Family History:  Family History  Problem Relation Age of Onset   Coronary artery disease Mother        PAD   Heart attack Mother 68   Diabetes Mother    Lung cancer Father    Heart attack Father 51    Hypertension Brother    High Cholesterol Brother      Current Outpatient Medications:    amLODipine  (NORVASC ) 5 MG tablet, TAKE 1 TABLET (5 MG TOTAL) BY MOUTH DAILY., Disp: 30 tablet, Rfl: 5   Blood Glucose Monitoring Suppl (CONTOUR MONITOR) w/Device KIT, 1 Device by Does not apply route daily. Patient is to test once daily. Dx E11.9, Disp: 1 kit, Rfl: 1   cyanocobalamin  (VITAMIN B12) 1000 MCG/ML injection, Inject 1 ml into the muscle once a week for a month.  Then injected 1 ml once a month thereafter, Disp: 6 mL, Rfl: 11   diclofenac  sodium (VOLTAREN ) 1 % GEL, Apply 3 gm to 3 large joints up to 3 times a day.Dispense 3 tubes with 3 refills., Disp: 5 Tube, Rfl: 0   Dulaglutide  (TRULICITY ) 0.75 MG/0.5ML SOAJ, INJECT 0.75 MG SUBCUTANEOUSLY ONE TIME PER WEEK, Disp: 6 mL, Rfl: 1   ezetimibe  (ZETIA ) 10 MG tablet, TAKE 1 TABLET BY MOUTH EVERY DAY, Disp: 30 tablet, Rfl: 5   glucose blood (CONTOUR NEXT TEST) test strip, Use to check sugar daily Dx E11.9, Disp: 100 strip, Rfl: 3   lisinopril  (ZESTRIL ) 40 MG tablet, Take 1 tablet (40 mg total) by mouth daily., Disp: 90 tablet, Rfl: 1   metFORMIN  (GLUCOPHAGE ) 1000 MG tablet, TAKE  1 TABLET (1,000 MG TOTAL) BY MOUTH TWICE A DAY WITH FOOD, Disp: 60 tablet, Rfl: 5   Microlet Lancets MISC, USE AS DIRECTED EVERY DAY, Disp: 100 each, Rfl: 11   nebivolol  (BYSTOLIC ) 5 MG tablet, TAKE 1 TABLET (5 MG TOTAL) BY MOUTH DAILY., Disp: 30 tablet, Rfl: 5   omeprazole  (PRILOSEC) 40 MG capsule, TAKE 1 CAPSULE BY MOUTH EVERY DAY, Disp: 30 capsule, Rfl: 5   scopolamine  (TRANSDERM-SCOP) 1 MG/3DAYS, Place 1 patch (1.5 mg total) onto the skin every 3 (three) days., Disp: 10 patch, Rfl: 0   simvastatin  (ZOCOR ) 20 MG tablet, TAKE 1 TABLET BY MOUTH DAILY AT 6 PM., Disp: 30 tablet, Rfl: 5   timolol (BETIMOL) 0.25 % ophthalmic solution, 1-2 drops 2 (two) times daily., Disp: , Rfl:    methocarbamol  (ROBAXIN ) 500 MG tablet, Take 1 tablet (500 mg total) by mouth 4 (four) times daily.  (Patient not taking: Reported on 03/22/2024), Disp: 20 tablet, Rfl: 0  Review of Systems:  Negative unless indicated in HPI.   Physical Exam: Vitals:   03/22/24 0706 03/22/24 0722  BP: 120/80 120/78  Pulse: 87   Temp: 98.5 F (36.9 C)   SpO2: 99%   Weight: 212 lb (96.2 kg)     Body mass index is 33.71 kg/m.     Impression and Plan:  Type 2 diabetes mellitus with other specified complication, without long-term current use of insulin (HCC) Assessment & Plan: Well-controlled today with an A1c of 5.9.  Orders: -     POCT glycosylated hemoglobin (Hb A1C)  Mixed hyperlipidemia Assessment & Plan: On simvastatin  and ezetimibe .  Recheck lipids next visit.  Last LDL at goal at 67.   Primary hypertension Assessment & Plan: Well-controlled on amlodipine , lisinopril , Nebivolol .   Morbid obesity (HCC) Assessment & Plan: -Discussed healthy lifestyle, including increased physical activity and better food choices to promote weight loss. She has linked co-morbidities including HTN, HLD and Dm 2.    Severe episode of recurrent major depressive disorder, without psychotic features [facility]) Assessment & Plan: Mood is stable.      Time spent:31 minutes reviewing chart, interviewing and examining patient and formulating plan of care.     [name], MD [facility]

## 2024-03-22 NOTE — Assessment & Plan Note (Signed)
 Discussed healthy lifestyle, including increased physical activity and better food choices to promote weight loss.

## 2024-03-22 NOTE — Assessment & Plan Note (Signed)
 Mood is stable.

## 2024-03-22 NOTE — Assessment & Plan Note (Signed)
 Well-controlled on amlodipine , lisinopril , Nebivolol .

## 2024-04-13 ENCOUNTER — Other Ambulatory Visit: Payer: Self-pay | Admitting: Internal Medicine

## 2024-04-13 DIAGNOSIS — I10 Essential (primary) hypertension: Secondary | ICD-10-CM

## 2024-04-19 ENCOUNTER — Ambulatory Visit (INDEPENDENT_AMBULATORY_CARE_PROVIDER_SITE_OTHER)

## 2024-04-19 DIAGNOSIS — E538 Deficiency of other specified B group vitamins: Secondary | ICD-10-CM

## 2024-04-19 MED ORDER — CYANOCOBALAMIN 1000 MCG/ML IJ SOLN
1000.0000 ug | Freq: Once | INTRAMUSCULAR | Status: AC
  Administered 2024-04-19: 1000 ug via INTRAMUSCULAR

## 2024-04-19 NOTE — Progress Notes (Signed)
 Patient is in office today for a nurse visit for B12 Injection. Patient Injection was given in the  Right deltoid. Patient tolerated injection well.

## 2024-05-13 ENCOUNTER — Other Ambulatory Visit: Payer: Self-pay | Admitting: Internal Medicine

## 2024-05-13 DIAGNOSIS — E782 Mixed hyperlipidemia: Secondary | ICD-10-CM

## 2024-05-13 DIAGNOSIS — I10 Essential (primary) hypertension: Secondary | ICD-10-CM

## 2024-05-20 ENCOUNTER — Ambulatory Visit

## 2024-05-24 ENCOUNTER — Ambulatory Visit

## 2024-06-15 ENCOUNTER — Ambulatory Visit: Admitting: Internal Medicine

## 2024-06-16 ENCOUNTER — Ambulatory Visit: Admitting: Internal Medicine

## 2024-06-18 ENCOUNTER — Ambulatory Visit
